# Patient Record
Sex: Female | Born: 1950 | Race: White | Hispanic: No | Smoking: Never smoker
Health system: Southern US, Community
[De-identification: ages and names within clinical notes are randomized; demographics above are authoritative.]

## PROBLEM LIST (undated history)

## (undated) DIAGNOSIS — E785 Hyperlipidemia, unspecified: Secondary | ICD-10-CM

## (undated) DIAGNOSIS — K219 Gastro-esophageal reflux disease without esophagitis: Secondary | ICD-10-CM

## (undated) DIAGNOSIS — M1990 Unspecified osteoarthritis, unspecified site: Secondary | ICD-10-CM

## (undated) DIAGNOSIS — E039 Hypothyroidism, unspecified: Secondary | ICD-10-CM

## (undated) DIAGNOSIS — T7840XA Allergy, unspecified, initial encounter: Secondary | ICD-10-CM

## (undated) DIAGNOSIS — I10 Essential (primary) hypertension: Secondary | ICD-10-CM

## (undated) DIAGNOSIS — R011 Cardiac murmur, unspecified: Secondary | ICD-10-CM

## (undated) DIAGNOSIS — K759 Inflammatory liver disease, unspecified: Secondary | ICD-10-CM

## (undated) HISTORY — DX: Gastro-esophageal reflux disease without esophagitis: K21.9

## (undated) HISTORY — PX: TONSILLECTOMY: SUR1361

## (undated) HISTORY — DX: Hypothyroidism, unspecified: E03.9

## (undated) HISTORY — DX: Essential (primary) hypertension: I10

## (undated) HISTORY — DX: Cardiac murmur, unspecified: R01.1

## (undated) HISTORY — PX: DILATION AND CURETTAGE OF UTERUS: SHX78

## (undated) HISTORY — PX: DILATION AND CURETTAGE, DIAGNOSTIC / THERAPEUTIC: SUR384

## (undated) HISTORY — DX: Allergy, unspecified, initial encounter: T78.40XA

## (undated) HISTORY — DX: Unspecified osteoarthritis, unspecified site: M19.90

---

## 2002-10-14 ENCOUNTER — Ambulatory Visit (HOSPITAL_COMMUNITY): Admission: RE | Admit: 2002-10-14 | Discharge: 2002-10-14 | Payer: Self-pay | Admitting: Unknown Physician Specialty

## 2002-10-14 ENCOUNTER — Encounter: Payer: Self-pay | Admitting: Unknown Physician Specialty

## 2002-11-05 ENCOUNTER — Ambulatory Visit (HOSPITAL_COMMUNITY): Admission: RE | Admit: 2002-11-05 | Discharge: 2002-11-05 | Payer: Self-pay | Admitting: Unknown Physician Specialty

## 2002-11-05 ENCOUNTER — Encounter: Payer: Self-pay | Admitting: Unknown Physician Specialty

## 2005-03-22 ENCOUNTER — Ambulatory Visit: Payer: Self-pay | Admitting: Family Medicine

## 2005-03-31 ENCOUNTER — Ambulatory Visit (HOSPITAL_COMMUNITY): Admission: RE | Admit: 2005-03-31 | Discharge: 2005-03-31 | Payer: Self-pay | Admitting: Family Medicine

## 2005-06-20 ENCOUNTER — Ambulatory Visit: Payer: Self-pay | Admitting: Family Medicine

## 2005-06-23 ENCOUNTER — Ambulatory Visit: Payer: Self-pay | Admitting: Family Medicine

## 2005-08-31 ENCOUNTER — Ambulatory Visit: Payer: Self-pay | Admitting: Family Medicine

## 2005-11-07 ENCOUNTER — Ambulatory Visit: Payer: Self-pay | Admitting: Family Medicine

## 2006-03-08 ENCOUNTER — Ambulatory Visit: Payer: Self-pay | Admitting: Family Medicine

## 2011-09-22 ENCOUNTER — Encounter (HOSPITAL_COMMUNITY): Payer: Self-pay | Admitting: Emergency Medicine

## 2011-09-22 ENCOUNTER — Emergency Department (HOSPITAL_COMMUNITY): Payer: BC Managed Care – PPO

## 2011-09-22 ENCOUNTER — Emergency Department (HOSPITAL_COMMUNITY)
Admission: EM | Admit: 2011-09-22 | Discharge: 2011-09-22 | Disposition: A | Discharge status: Home / Self Care | Payer: BC Managed Care – PPO | Attending: Emergency Medicine | Admitting: Emergency Medicine

## 2011-09-22 DIAGNOSIS — R109 Unspecified abdominal pain: Secondary | ICD-10-CM | POA: Insufficient documentation

## 2011-09-22 DIAGNOSIS — K449 Diaphragmatic hernia without obstruction or gangrene: Secondary | ICD-10-CM | POA: Insufficient documentation

## 2011-09-22 DIAGNOSIS — J029 Acute pharyngitis, unspecified: Secondary | ICD-10-CM | POA: Insufficient documentation

## 2011-09-22 DIAGNOSIS — E039 Hypothyroidism, unspecified: Secondary | ICD-10-CM | POA: Insufficient documentation

## 2011-09-22 DIAGNOSIS — N390 Urinary tract infection, site not specified: Secondary | ICD-10-CM | POA: Insufficient documentation

## 2011-09-22 DIAGNOSIS — M549 Dorsalgia, unspecified: Secondary | ICD-10-CM | POA: Insufficient documentation

## 2011-09-22 DIAGNOSIS — Z79899 Other long term (current) drug therapy: Secondary | ICD-10-CM | POA: Insufficient documentation

## 2011-09-22 HISTORY — DX: Hyperlipidemia, unspecified: E78.5

## 2011-09-22 LAB — URINE MICROSCOPIC-ADD ON

## 2011-09-22 LAB — URINALYSIS, ROUTINE W REFLEX MICROSCOPIC
Bilirubin Urine: NEGATIVE
Glucose, UA: NEGATIVE mg/dL
Hgb urine dipstick: NEGATIVE
Ketones, ur: NEGATIVE mg/dL
Nitrite: NEGATIVE
Protein, ur: NEGATIVE mg/dL
Specific Gravity, Urine: 1.02 (ref 1.005–1.030)
Urobilinogen, UA: 0.2 mg/dL (ref 0.0–1.0)
pH: 6 (ref 5.0–8.0)

## 2011-09-22 LAB — DIFFERENTIAL
Basophils Absolute: 0.1 10*3/uL (ref 0.0–0.1)
Basophils Relative: 1 % (ref 0–1)
Eosinophils Absolute: 0.2 10*3/uL (ref 0.0–0.7)
Eosinophils Relative: 2 % (ref 0–5)
Lymphocytes Relative: 24 % (ref 12–46)
Lymphs Abs: 1.8 10*3/uL (ref 0.7–4.0)
Monocytes Absolute: 0.7 10*3/uL (ref 0.1–1.0)
Monocytes Relative: 8 % (ref 3–12)
Neutro Abs: 5.1 10*3/uL (ref 1.7–7.7)
Neutrophils Relative %: 65 % (ref 43–77)

## 2011-09-22 LAB — CBC
HCT: 44 % (ref 36.0–46.0)
Hemoglobin: 14 g/dL (ref 12.0–15.0)
MCH: 30 pg (ref 26.0–34.0)
MCHC: 31.8 g/dL (ref 30.0–36.0)
MCV: 94.2 fL (ref 78.0–100.0)
Platelets: 272 10*3/uL (ref 150–400)
RBC: 4.67 MIL/uL (ref 3.87–5.11)
RDW: 14 % (ref 11.5–15.5)
WBC: 7.8 10*3/uL (ref 4.0–10.5)

## 2011-09-22 LAB — COMPREHENSIVE METABOLIC PANEL
ALT: 28 U/L (ref 0–35)
AST: 27 U/L (ref 0–37)
Albumin: 4.2 g/dL (ref 3.5–5.2)
Alkaline Phosphatase: 86 U/L (ref 39–117)
BUN: 17 mg/dL (ref 6–23)
CO2: 26 mEq/L (ref 19–32)
Calcium: 10.3 mg/dL (ref 8.4–10.5)
Chloride: 102 mEq/L (ref 96–112)
Creatinine, Ser: 0.87 mg/dL (ref 0.50–1.10)
GFR calc Af Amer: 81 mL/min — ABNORMAL LOW (ref >90)
GFR calc non Af Amer: 70 mL/min — ABNORMAL LOW (ref >90)
Glucose, Bld: 106 mg/dL — ABNORMAL HIGH (ref 70–99)
Potassium: 4.2 mEq/L (ref 3.5–5.1)
Sodium: 139 mEq/L (ref 135–145)
Total Bilirubin: 0.2 mg/dL — ABNORMAL LOW (ref 0.3–1.2)
Total Protein: 7.6 g/dL (ref 6.0–8.3)

## 2011-09-22 LAB — LIPASE, BLOOD: Lipase: 25 U/L (ref 11–59)

## 2011-09-22 LAB — TROPONIN I: Troponin I: <0.3 ng/mL (ref <0.30)

## 2011-09-22 MED ORDER — SODIUM CHLORIDE 0.9 % IV SOLN: INTRAVENOUS | Status: DC

## 2011-09-22 MED ORDER — IOHEXOL 300 MG/ML  SOLN
100.0000 mL | Freq: Once | INTRAMUSCULAR | Status: AC | PRN
  Administered 2011-09-22: 100 mL via INTRAVENOUS

## 2011-09-22 MED ORDER — ONDANSETRON HCL 4 MG/2ML IJ SOLN: 4.0000 mg | Freq: Once | INTRAMUSCULAR | Status: DC

## 2011-09-22 MED ORDER — DEXTROSE 5 % IV SOLN
1.0000 g | Freq: Once | INTRAVENOUS | Status: AC
  Administered 2011-09-22: 1 g via INTRAVENOUS
  Filled 2011-09-22: qty 10

## 2011-09-22 MED ORDER — HYDROMORPHONE HCL PF 1 MG/ML IJ SOLN: 1.0000 mg | Freq: Once | INTRAMUSCULAR | Status: DC

## 2011-09-22 MED ORDER — CEPHALEXIN 500 MG PO CAPS: 500.0000 mg | ORAL_CAPSULE | Freq: Four times a day (QID) | ORAL | Status: AC

## 2011-09-22 MED ORDER — HYDROCODONE-ACETAMINOPHEN 5-325 MG PO TABS: 1.0000 | ORAL_TABLET | Freq: Four times a day (QID) | ORAL | Status: DC | PRN

## 2011-09-22 MED ORDER — SODIUM CHLORIDE 0.9 % IV BOLUS (SEPSIS): 250.0000 mL | Freq: Once | INTRAVENOUS | Status: DC

## 2011-09-22 NOTE — ED Notes (Signed)
Pt c/o rt sided abd/flank pain since lunch today. Pt states the sharp pain is intermittent.

## 2011-09-22 NOTE — Discharge Instructions (Signed)
Urinary Tract Infection A urinary tract infection (UTI) is often caused by a germ (bacteria). A UTI is usually helped with medicine (antibiotics) that kills germs. Take all the medicine until it is gone. Do this even if you are feeling better. You are usually better in 7 to 10 days. HOME CARE   Drink enough water and fluids to keep your pee (urine) clear or pale yellow. Drink:   Cranberry juice.   Water.   Avoid:   Caffeine.   Tea.   Bubbly (carbonated) drinks.   Alcohol.   Only take medicine as told by your doctor.   To prevent further infections:   Pee often.   After pooping (bowel movement), women should wipe from front to back. Use each tissue only once.   Pee before and after having sex (intercourse).  Ask your doctor when your test results will be ready. Make sure you follow up and get your test results.  GET HELP RIGHT AWAY IF:   There is very bad back pain or lower belly (abdominal) pain.   You get the chills.   You have a fever.   Your baby is older than 3 months with a rectal temperature of 102 F (38.9 C) or higher.   Your baby is 63 months old or younger with a rectal temperature of 100.4 F (38 C) or higher.   You feel sick to your stomach (nauseous) or throw up (vomit).   There is continued burning with peeing.   Your problems are not better in 3 days. Return sooner if you are getting worse.  MAKE SURE YOU:   Understand these instructions.   Will watch your condition.   Will get help right away if you are not doing well or get worse.  Document Released: 11/15/2007 Document Revised: 05/18/2011 Document Reviewed: 11/15/2007 [facility] Patient Information 2012 [place], [place].  Take antibioticas directed and  followup with your doctor or back here if not better in 2 days or for any new or worse symptoms. Other possibility could be peptic ulcer disease which you're already taking Zantac on a regular basis. Pain medicine provided as needed.

## 2011-09-22 NOTE — ED Provider Notes (Signed)
History  This chart was scribed for Shelda Jakes, MD by Bennett Scrape and Temilola Ajibulu. This patient was seen in room AP[facility]2/AP[facility] and the patient's care was started at 7:25PM.  CSN: [id number]  Arrival date & time 09/22/11  1636   First MD Initiated Contact with Patient 09/22/11 1916      Chief Complaint  Patient presents with  . Abdominal Pain  . Flank Pain    The history is provided by the patient. No language interpreter was used.    Kelly Barajas is a 61 y.o. female who presents to the Emergency Department complaining of 5 hours of gradual onset, gradually improving, intermittent RUQ abdominal pain with radiation to the flank pain and umbilicus that started after she ate lunch.  Pt describes the pain as sharp. She reports that the pain has currently "eased off" and is now a dull ache. Pt rates her pain a 10/10 at onset but has eased of to a 3/10 currently. Pt denies any modifying factors. She denies taking any medication to ease the symptoms. She reports calling her PCP and was advised to be evaluated in this ED for the symptoms. She denies having any previous episodes of similar symptoms. She also c/o a sore throat and undescribed "sinus symptoms" that she attributes to allergies. Pt also denies fatigue, SOB, chest pain and rash as associated symptoms. Pt has a h/o hyperlipidemia and decreased thyroid activity. Pt denies smoking or alcohol use.   Pt's PCP is Dr. Lysbeth Galas.  Past Medical History  Diagnosis Date  . Hyperlipidemia   . Thyroid activity decreased     History reviewed. No pertinent past surgical history.  No family history on file.  History  Substance Use Topics  . Smoking status: Not on file  . Smokeless tobacco: Not on file  . Alcohol Use: No     Review of Systems  Constitutional: Negative for fever and chills.  HENT: Positive for sore throat. Negative for congestion and neck pain.   Eyes: Negative for visual disturbance.  Respiratory:  Negative for cough and shortness of breath.   Cardiovascular: Negative for chest pain.  Gastrointestinal: Positive for abdominal pain. Negative for nausea, vomiting and diarrhea.  Genitourinary: Negative for dysuria, urgency and hematuria.  Musculoskeletal: Positive for back pain.  Skin: Negative for rash.  Neurological: Negative for seizures and headaches.  Hematological: Does not bruise/bleed easily.  Psychiatric/Behavioral: Negative for confusion.    Allergies  Review of patient's allergies indicates no known allergies.  Home Medications   Current Outpatient Rx  Name Route Sig Dispense Refill  . ASPIRIN EC 81 MG PO TBEC Oral Take 81 mg by mouth at bedtime.    . OMEGA-3 FATTY ACIDS 1000 MG PO CAPS Oral Take 1 g by mouth 2 (two) times daily.    Marland Kitchen LEVOTHYROXINE SODIUM 75 MCG PO TABS Oral Take 75 mcg by mouth every morning.    Marland Kitchen NAPROXEN SODIUM 220 MG PO TABS Oral Take 220-440 mg by mouth as needed. For pain    . PITAVASTATIN CALCIUM 1 MG PO TABS Oral Take 1 tablet by mouth at bedtime.    Marland Kitchen RANITIDINE HCL 150 MG PO TABS Oral Take 150 mg by mouth at bedtime.    . CEPHALEXIN 500 MG PO CAPS Oral Take 1 capsule (500 mg total) by mouth 4 (four) times daily. 28 capsule 0  . HYDROCODONE-ACETAMINOPHEN 5-325 MG PO TABS Oral Take 1-2 tablets by mouth every 6 (six) hours as needed for pain. 10  tablet 0    Triage Vitals: BP 154/88  Pulse 97  Temp(Src) 97.6 F (36.4 C) (Oral)  Resp 18  Ht 5\' 5"  (1.651 m)  Wt 185 lb (83.915 kg)  BMI 30.79 kg/m2  SpO2 95%  Physical Exam  Nursing note and vitals reviewed. Constitutional: She is oriented to person, place, and time. She appears well-developed and well-nourished.  HENT:  Head: Normocephalic and atraumatic.  Eyes: Conjunctivae and EOM are normal. No scleral icterus.  Neck: Normal range of motion. Neck supple.  Cardiovascular: Normal rate, regular rhythm and normal heart sounds.   Pulmonary/Chest: Effort normal and breath sounds normal.    Abdominal: Soft. Bowel sounds are normal. There is no tenderness. There is no guarding.  Musculoskeletal: Normal range of motion. She exhibits no edema.  Neurological: She is alert and oriented to person, place, and time.  Skin: Skin is warm and dry.  Psychiatric: She has a normal mood and affect. Her behavior is normal.    ED Course  Procedures (including critical care time)  DIAGNOSTIC STUDIES: Oxygen Saturation is 95% on room air, adequate by my interpretation.    COORDINATION OF CARE: 7:35PM- Discussed CT scan of abdomen with pt and pt agreed to scan. Pt stated that since pain was dull, she did not want any pain medications currently.   Labs Reviewed  URINALYSIS, ROUTINE W REFLEX MICROSCOPIC - Abnormal; Notable for the following:    Color, Urine AMBER (*) BIOCHEMICALS MAY BE AFFECTED BY COLOR   Leukocytes, UA MODERATE (*)    All other components within normal limits  COMPREHENSIVE METABOLIC PANEL - Abnormal; Notable for the following:    Glucose, Bld 106 (*)    Total Bilirubin 0.2 (*)    GFR calc non Af Amer 70 (*)    GFR calc Af Amer 81 (*)    All other components within normal limits  URINE MICROSCOPIC-ADD ON - Abnormal; Notable for the following:    Squamous Epithelial / LPF FEW (*)    Bacteria, UA FEW (*)    All other components within normal limits  CBC  DIFFERENTIAL  LIPASE, BLOOD  TROPONIN I  URINE CULTURE   Dg Chest 2 View  09/22/2011  *RADIOLOGY REPORT*  Clinical Data: Abdominal and flank pain.  CHEST - 2 VIEW  Comparison: None  Findings: Heart size is normal. Both lungs are clear.  No evidence of pleural effusion.  No mass or lymphadenopathy identified.  IMPRESSION: No active disease.  Original Report Authenticated By: Danae Orleans, M.D.   Ct Abdomen Pelvis W Contrast  09/22/2011  *RADIOLOGY REPORT*  Clinical Data: RLQ/right flank pain  CT ABDOMEN AND PELVIS WITH CONTRAST  Technique:  Multidetector CT imaging of the abdomen and pelvis was performed following  the standard protocol during bolus administration of intravenous contrast.  Contrast: OMNIPAQUE IOHEXOL 300 MG/ML  SOLN  Comparison: None.  Findings: Mild scarring versus atelectasis at the lung bases.  Moderate hiatal hernia.  Liver, spleen, pancreas, and adrenal glands are within normal limits.  Gallbladder is unremarkable.  No intrahepatic or extrahepatic ductal dilatation.  Kidneys are within normal limits.  No hydronephrosis.  No evidence of bowel obstruction.  Normal appendix.  Apparent soft tissue density in the region of the cecum/ileocecal valve is favored to reflect stool/debris with mixing of contrast (series 2/image 40).  Atherosclerotic calcifications of the abdominal aorta and branch vessels.  No abdominopelvic ascites.  No suspicious abdominopelvic lymphadenopathy.  Uterus and bilateral ovaries are unremarkable.  Bladder is  within normal limits.  Mild degenerative changes of the visualized thoracolumbar spine, most prominent at L5-S1.  IMPRESSION: Normal appendix.  No evidence of bowel obstruction.  Moderate hiatal hernia.  No CT findings to account for the patient's abdominal pain.  Original Report Authenticated By: Charline Bills, M.D.     Date: 09/22/2011  Rate: 64  Rhythm: normal sinus rhythm  QRS Axis: normal  Intervals: normal  ST/T Wave abnormalities: normal  Conduction Disutrbances:none  Narrative Interpretation:   Old EKG Reviewed: none available  Results for orders placed during the hospital encounter of 09/22/11  URINALYSIS, ROUTINE W REFLEX MICROSCOPIC      Component Value Range   Color, Urine AMBER (*) YELLOW    APPearance CLEAR  CLEAR    Specific Gravity, Urine 1.020  1.005 - 1.030    pH 6.0  5.0 - 8.0    Glucose, UA NEGATIVE  NEGATIVE (mg/dL)   Hgb urine dipstick NEGATIVE  NEGATIVE    Bilirubin Urine NEGATIVE  NEGATIVE    Ketones, ur NEGATIVE  NEGATIVE (mg/dL)   Protein, ur NEGATIVE  NEGATIVE (mg/dL)   Urobilinogen, UA 0.2  0.0 - 1.0 (mg/dL)   Nitrite  NEGATIVE  NEGATIVE    Leukocytes, UA MODERATE (*) NEGATIVE   COMPREHENSIVE METABOLIC PANEL      Component Value Range   Sodium 139  135 - 145 (mEq/L)   Potassium 4.2  3.5 - 5.1 (mEq/L)   Chloride 102  96 - 112 (mEq/L)   CO2 26  19 - 32 (mEq/L)   Glucose, Bld 106 (*) 70 - 99 (mg/dL)   BUN 17  6 - 23 (mg/dL)   Creatinine, Ser 8.46  0.50 - 1.10 (mg/dL)   Calcium 96.2  8.4 - 10.5 (mg/dL)   Total Protein 7.6  6.0 - 8.3 (g/dL)   Albumin 4.2  3.5 - 5.2 (g/dL)   AST 27  0 - 37 (U/L)   ALT 28  0 - 35 (U/L)   Alkaline Phosphatase 86  39 - 117 (U/L)   Total Bilirubin 0.2 (*) 0.3 - 1.2 (mg/dL)   GFR calc non Af Amer 70 (*) >90 (mL/min)   GFR calc Af Amer 81 (*) >90 (mL/min)  CBC      Component Value Range   WBC 7.8  4.0 - 10.5 (K/uL)   RBC 4.67  3.87 - 5.11 (MIL/uL)   Hemoglobin 14.0  12.0 - 15.0 (g/dL)   HCT 95.2  84.1 - 32.4 (%)   MCV 94.2  78.0 - 100.0 (fL)   MCH 30.0  26.0 - 34.0 (pg)   MCHC 31.8  30.0 - 36.0 (g/dL)   RDW 40.1  02.7 - 25.3 (%)   Platelets 272  150 - 400 (K/uL)  DIFFERENTIAL      Component Value Range   Neutrophils Relative 65  43 - 77 (%)   Neutro Abs 5.1  1.7 - 7.7 (K/uL)   Lymphocytes Relative 24  12 - 46 (%)   Lymphs Abs 1.8  0.7 - 4.0 (K/uL)   Monocytes Relative 8  3 - 12 (%)   Monocytes Absolute 0.7  0.1 - 1.0 (K/uL)   Eosinophils Relative 2  0 - 5 (%)   Eosinophils Absolute 0.2  0.0 - 0.7 (K/uL)   Basophils Relative 1  0 - 1 (%)   Basophils Absolute 0.1  0.0 - 0.1 (K/uL)  URINE MICROSCOPIC-ADD ON      Component Value Range   Squamous Epithelial / LPF FEW (*)  RARE    WBC, UA 21-50  <3 (WBC/hpf)   RBC / HPF 0-2  <3 (RBC/hpf)   Bacteria, UA FEW (*) RARE   LIPASE, BLOOD      Component Value Range   Lipase 25  11 - 59 (U/L)  TROPONIN I      Component Value Range   Troponin I <0.30  <0.30 (ng/mL)     1. Urinary tract infection       MDM   Workup for right upper quadrant pain negative CT no evidence of gallstones appendix is normal urinalysis  consistent with urinary tract infection without question. We'll treat with Rocephin 1 g here and Keflex by mouth at home. No evidence of cardiac event no acute changes on EKG troponin is negative. Symptoms most likely all related to urinary tract infection of peptic ulcer disease is a possibility. Patient already taking Zantac.      I personally performed the services described in this documentation, which was scribed in my presence. The recorded information has been reviewed and considered.     Shelda Jakes, MD 09/22/11 [id number]

## 2011-09-24 LAB — URINE CULTURE
Colony Count: NO GROWTH
Culture  Setup Time: 201304132045
Culture: NO GROWTH

## 2011-09-28 ENCOUNTER — Ambulatory Visit (INDEPENDENT_AMBULATORY_CARE_PROVIDER_SITE_OTHER): Payer: BC Managed Care – PPO | Admitting: Internal Medicine

## 2011-09-28 ENCOUNTER — Encounter (INDEPENDENT_AMBULATORY_CARE_PROVIDER_SITE_OTHER): Payer: Self-pay | Admitting: Internal Medicine

## 2011-09-28 VITALS — BP 136/82 | HR 80 | Temp 97.9°F | Ht 65.0 in | Wt 184.6 lb

## 2011-09-28 DIAGNOSIS — N390 Urinary tract infection, site not specified: Secondary | ICD-10-CM | POA: Insufficient documentation

## 2011-09-28 DIAGNOSIS — N200 Calculus of kidney: Secondary | ICD-10-CM | POA: Insufficient documentation

## 2011-09-28 DIAGNOSIS — R1031 Right lower quadrant pain: Secondary | ICD-10-CM | POA: Insufficient documentation

## 2011-09-28 NOTE — Patient Instructions (Signed)
If pain returns, call our office or go to the ED. Suspect she had a kidney stone.

## 2011-09-28 NOTE — Progress Notes (Signed)
Subjective:     Patient ID: Kelly Barajas, female   DOB: 03/28/1951, 61 y.o.   MRN: [id number]  HPIIn the ED 09/22/2011 with mid rt abdominal pain and radiating to her umbilicus. Felt a cramping type pain.She said the pain was like having a baby. She actually thought she had a virus.  The pain became severe and she was bending over with the pain. She went to the ED. She tells me by 9pm the pain had resolved. The pain resolved before going to CT. She had gone to the bathroom to void several times before the CT. The pain lasted 6 hrs. She tells me she has been on a diet and has lost about 20 pounds. Her acid reflux is better. She had a lot of gas that night in the ED. Today she has lower back pain.  She has had this episode about 2-3 times in the past.  At times foods are lodging in her esophagus. She tries to relax for the food to go down. Symptoms for along time. Acid reflux is better since losing weight.  BM are normal. No weight loss. Appetite is good. CT abdomen/pelvis with CM 09/22/2011; IMPRESSION:  Normal appendix. No evidence of bowel obstruction.  Moderate hiatal hernia.  No CT findings to account for the patient's abdominal pain. CMP     Component Value Date/Time   NA 139 09/22/2011 1730   K 4.2 09/22/2011 1730   CL 102 09/22/2011 1730   CO2 26 09/22/2011 1730   GLUCOSE 106* 09/22/2011 1730   BUN 17 09/22/2011 1730   CREATININE 0.87 09/22/2011 1730   CALCIUM 10.3 09/22/2011 1730   PROT 7.6 09/22/2011 1730   ALBUMIN 4.2 09/22/2011 1730   AST 27 09/22/2011 1730   ALT 28 09/22/2011 1730   ALKPHOS 86 09/22/2011 1730   BILITOT 0.2* 09/22/2011 1730   GFRNONAA 70* 09/22/2011 1730   GFRAA 81* 09/22/2011 1730    Urinalysis    Component Value Date/Time   COLORURINE AMBER* 09/22/2011 1824   APPEARANCEUR CLEAR 09/22/2011 1824   LABSPEC 1.020 09/22/2011 1824   PHURINE 6.0 09/22/2011 1824   GLUCOSEU NEGATIVE 09/22/2011 1824   HGBUR NEGATIVE 09/22/2011 1824   BILIRUBINUR NEGATIVE 09/22/2011 1824   KETONESUR NEGATIVE 09/22/2011 1824   PROTEINUR NEGATIVE 09/22/2011 1824   UROBILINOGEN 0.2 09/22/2011 1824   NITRITE NEGATIVE 09/22/2011 1824   LEUKOCYTESUR MODERATE* 09/22/2011 1824   CBC    Component Value Date/Time   WBC 7.8 09/22/2011 1730   RBC 4.67 09/22/2011 1730   HGB 14.0 09/22/2011 1730   HCT 44.0 09/22/2011 1730   PLT 272 09/22/2011 1730   MCV 94.2 09/22/2011 1730   MCH 30.0 09/22/2011 1730   MCHC 31.8 09/22/2011 1730   RDW 14.0 09/22/2011 1730   LYMPHSABS 1.8 09/22/2011 1730   MONOABS 0.7 09/22/2011 1730   EOSABS 0.2 09/22/2011 1730   BASOSABS 0.1 09/22/2011 1730       Review of Systems see hpi    Objective:   Physical Exam Filed Vitals:   09/28/11 1437  Height: 5\' 5"  (1.651 m)  Weight: 184 lb 9.6 oz (83.734 kg)   Alert and oriented. Skin warm and dry. Oral mucosa is moist.   . Sclera anicteric, conjunctivae is pink. Thyroid not enlarged. No cervical lymphadenopathy. Lungs clear. Heart regular rate and rhythm.  Abdomen is soft. Bowel sounds are positive. No hepatomegaly. No abdominal masses felt. No tenderness.  No edema to lower extremities. Patient is alert and oriented.  Assessment:   Suspect she may have passed a kidney stone before arriving in CT.  Her symptoms do not suggest PUD.  Her symptoms resolved suddenly and pain was located near the umblicus.  CBD was normal caliber. There was no bump in her transaminases.   Occasionally dysphagia. Plan:       Continue the Zantac daily. If symptoms return call our office or go to the emergency room.

## 2012-10-04 ENCOUNTER — Ambulatory Visit (INDEPENDENT_AMBULATORY_CARE_PROVIDER_SITE_OTHER): Payer: PRIVATE HEALTH INSURANCE | Admitting: Cardiology

## 2012-10-04 ENCOUNTER — Telehealth: Payer: Self-pay | Admitting: Cardiology

## 2012-10-04 ENCOUNTER — Encounter: Payer: Self-pay | Admitting: *Deleted

## 2012-10-04 ENCOUNTER — Encounter: Payer: Self-pay | Admitting: Cardiology

## 2012-10-04 VITALS — BP 132/83 | HR 97 | Ht 63.0 in | Wt 204.0 lb

## 2012-10-04 DIAGNOSIS — M79602 Pain in left arm: Secondary | ICD-10-CM | POA: Insufficient documentation

## 2012-10-04 DIAGNOSIS — R9431 Abnormal electrocardiogram [ECG] [EKG]: Secondary | ICD-10-CM

## 2012-10-04 DIAGNOSIS — R0602 Shortness of breath: Secondary | ICD-10-CM

## 2012-10-04 DIAGNOSIS — M79609 Pain in unspecified limb: Secondary | ICD-10-CM

## 2012-10-04 DIAGNOSIS — E782 Mixed hyperlipidemia: Secondary | ICD-10-CM | POA: Insufficient documentation

## 2012-10-04 NOTE — Assessment & Plan Note (Signed)
On regimen outlined above. She willl be following up with her primary care provider over the next 8 weeks.

## 2012-10-04 NOTE — Telephone Encounter (Signed)
stress echocardiogram    Wednesday, April 30th , 2014 [facility] CHECKING PERCERT

## 2012-10-04 NOTE — Assessment & Plan Note (Signed)
As described above, no associated chest pain. Her recent arm discomfort was nonexertional, possibly related to statin based on improvement with change in medication. Her ECG does show decreased R waves in the anteroseptal leads, low voltage. Personal risk factors include hyperlipidemia that has been long-standing, also family history of CAD in both parents. Plan is to further risk stratify with an exercise echocardiogram. We will call her with the results.

## 2012-10-04 NOTE — Patient Instructions (Addendum)
Your physician recommends that you continue on your current medications as directed. Please refer to the Current Medication list given to you today. Your physician has requested that you have a stress echocardiogram. For further information please visit [email or web address]. Please follow instruction sheet as given. We will call you with your results.  

## 2012-10-04 NOTE — Progress Notes (Signed)
Clinical Summary [name] is a 62 y.o.female referred for cardiology consultation by Kelly Olp NP. She is referred for further cardiac risk stratification in light of personal risk factors and family history. She also reports a fairly chronic feeling of dyspnea on exertion, generally NYHA class II, no exertional chest pain.  She has had some arm discomfort, reported that it was worse when she was on Pravachol, and improving now on the regimen outlined below. This was nonexertional in nature. She denies any frank claudication symptoms.  She does not exercise with any regularity. States that she has gained 16 pounds over several months. She does seem to be motivated to try and reverse this trend.  ECG today shows sinus rhythm with decreased anteroseptal R waves, single PVC, low voltage. She has not undergone any previous ischemic testing  No Known Allergies  Current Outpatient Prescriptions  Medication Sig Dispense Refill  . aspirin EC 81 MG tablet Take 81 mg by mouth at bedtime.      . fish oil-omega-3 fatty acids 1000 MG capsule Take 1 g by mouth 4 (four) times daily.       Marland Kitchen levothyroxine (SYNTHROID, LEVOTHROID) 75 MCG tablet Take 75 mcg by mouth every morning.      . naproxen sodium (ANAPROX) 220 MG tablet Take 440 mg by mouth 2 (two) times daily. For pain      . Pitavastatin Calcium (LIVALO) 1 MG TABS Take 2 tablets by mouth 4 (four) times a week.       . ranitidine (ZANTAC) 150 MG tablet Take 150 mg by mouth 2 (two) times daily.        No current facility-administered medications for this visit.    Past Medical History  Diagnosis Date  . Hyperlipidemia   . Hypothyroidism   . GERD (gastroesophageal reflux disease)     Past Surgical History  Procedure Laterality Date  . Tonsillectomy      Family History  Problem Relation Age of Onset  . CAD Father   . Hyperlipidemia Mother   . Diabetes Mellitus II Mother   . CAD Mother   . Hyperlipidemia Father     Social  History [name] reports that she has never smoked. She does not have any smokeless tobacco history on file. [name] reports that she does not drink alcohol.  Review of Systems No palpitations, dizziness, syncope. Stable appetite. No orthopnea or PND. No bleeding problems. Otherwise negative.  Physical Examination Filed Vitals:   10/04/12 1017  BP: 132/83  Pulse: 97   Filed Weights   10/04/12 1017  Weight: 204 lb (92.534 kg)   Overweight woman in no acute distress. HEENT: Conjunctiva and lids normal, oropharynx clear. Neck: Supple, no elevated JVP or carotid bruits, no thyromegaly. Lungs: Clear to auscultation, nonlabored breathing at rest. Cardiac: Regular rate and rhythm, no S3, soft systolic murmur, no pericardial rub. Abdomen: Soft, nontender, bowel sounds present. Extremities: No pitting edema, distal pulses 2+. Skin: Warm and dry. Musculoskeletal: No kyphosis. Neuropsychiatric: Alert and oriented x3, affect grossly appropriate.   Problem List and Plan   Shortness of breath As described above, no associated chest pain. Her recent arm discomfort was nonexertional, possibly related to statin based on improvement with change in medication. Her ECG does show decreased R waves in the anteroseptal leads, low voltage. Personal risk factors include hyperlipidemia that has been long-standing, also family history of CAD in both parents. Plan is to further risk stratify with an exercise echocardiogram. We will  call her with the results.  Mixed hyperlipidemia On regimen outlined above. She willl be following up with her primary care provider over the next 8 weeks.    Jonelle Sidle, M.D., F.A.C.C.

## 2012-10-04 NOTE — Telephone Encounter (Signed)
No precert required 

## 2012-10-09 DIAGNOSIS — R0602 Shortness of breath: Secondary | ICD-10-CM

## 2012-10-14 ENCOUNTER — Telehealth: Payer: Self-pay | Admitting: *Deleted

## 2012-10-14 NOTE — Telephone Encounter (Signed)
Message copied by Eustace Moore on Mon Oct 14, 2012  2:34 PM ------      Message from: Jonelle Sidle      Created: Fri Oct 11, 2012  9:51 AM       Reviewed. No definite ischemia based on echocardiographic images with probable false positive ECG response. No further cardiac ischemic testing planned at this time, unless she manifests any progressive symptoms. Keep follow with primary care, and we can see her back if needed. ------

## 2012-10-14 NOTE — Telephone Encounter (Signed)
Patient informed and request a copy be sent to her PCP. Copy sent.

## 2013-11-27 DIAGNOSIS — I10 Essential (primary) hypertension: Secondary | ICD-10-CM | POA: Insufficient documentation

## 2013-11-27 DIAGNOSIS — K219 Gastro-esophageal reflux disease without esophagitis: Secondary | ICD-10-CM | POA: Insufficient documentation

## 2013-11-27 HISTORY — DX: Essential (primary) hypertension: I10

## 2014-06-01 DIAGNOSIS — E039 Hypothyroidism, unspecified: Secondary | ICD-10-CM | POA: Insufficient documentation

## 2016-03-07 DIAGNOSIS — E039 Hypothyroidism, unspecified: Secondary | ICD-10-CM | POA: Diagnosis not present

## 2016-03-07 DIAGNOSIS — E559 Vitamin D deficiency, unspecified: Secondary | ICD-10-CM | POA: Diagnosis not present

## 2016-03-07 DIAGNOSIS — K219 Gastro-esophageal reflux disease without esophagitis: Secondary | ICD-10-CM | POA: Diagnosis not present

## 2016-03-07 DIAGNOSIS — N3281 Overactive bladder: Secondary | ICD-10-CM | POA: Diagnosis not present

## 2016-03-07 DIAGNOSIS — E785 Hyperlipidemia, unspecified: Secondary | ICD-10-CM | POA: Diagnosis not present

## 2016-04-04 ENCOUNTER — Encounter (HOSPITAL_COMMUNITY): Payer: Self-pay | Admitting: Emergency Medicine

## 2016-04-04 ENCOUNTER — Emergency Department (HOSPITAL_COMMUNITY): Payer: PPO

## 2016-04-04 ENCOUNTER — Emergency Department (HOSPITAL_COMMUNITY)
Admission: EM | Admit: 2016-04-04 | Discharge: 2016-04-04 | Disposition: A | Discharge status: Home / Self Care | Payer: PPO | Attending: Emergency Medicine | Admitting: Emergency Medicine

## 2016-04-04 DIAGNOSIS — X501XXA Overexertion from prolonged static or awkward postures, initial encounter: Secondary | ICD-10-CM | POA: Insufficient documentation

## 2016-04-04 DIAGNOSIS — M25562 Pain in left knee: Secondary | ICD-10-CM

## 2016-04-04 DIAGNOSIS — Y9301 Activity, walking, marching and hiking: Secondary | ICD-10-CM | POA: Insufficient documentation

## 2016-04-04 DIAGNOSIS — E039 Hypothyroidism, unspecified: Secondary | ICD-10-CM | POA: Insufficient documentation

## 2016-04-04 DIAGNOSIS — Z7982 Long term (current) use of aspirin: Secondary | ICD-10-CM | POA: Insufficient documentation

## 2016-04-04 DIAGNOSIS — Y929 Unspecified place or not applicable: Secondary | ICD-10-CM | POA: Insufficient documentation

## 2016-04-04 DIAGNOSIS — Y999 Unspecified external cause status: Secondary | ICD-10-CM | POA: Diagnosis not present

## 2016-04-04 MED ORDER — HYDROCODONE-ACETAMINOPHEN 5-325 MG PO TABS
1.0000 | ORAL_TABLET | Freq: Once | ORAL | Status: AC
  Administered 2016-04-04: 1 via ORAL
  Filled 2016-04-04: qty 1

## 2016-04-04 MED ORDER — IBUPROFEN 600 MG PO TABS: 600.0000 mg | ORAL_TABLET | Freq: Four times a day (QID) | ORAL | 0 refills | Status: DC | PRN

## 2016-04-04 NOTE — Progress Notes (Signed)
Orthopedic Tech Progress Note Patient Details:  Kelly HalimCarolyn M Doleman 10/05/1950 [id number] Crutches were ordered but patient was unable to ambulate properly with the device. Did not give crutches to patient, patient stated she would have a walker waiting for her at home. Gave her basic instructions for ambulation with walker. Ortho Devices Type of Ortho Device: Knee Immobilizer Ortho Device/Splint Location: LLE KI, Crutches  Ortho Device/Splint Interventions: Ordered, Application   Clois Dupesvery S Belmira Daley 04/04/2016, 10:47 PM

## 2016-04-04 NOTE — ED Triage Notes (Signed)
Pt. felt a pop at left knee while getting up a bleacher this evening , reports posterior left knee pain .

## 2016-04-04 NOTE — ED Notes (Signed)
Pt stable, understands discharge instructions, and reasons for return.   

## 2016-04-04 NOTE — ED Provider Notes (Signed)
[facility]-EMERGENCY DEPT Provider Note   CSN: [id number] Arrival date & time: 04/04/16  2032   By signing my name below, I, Teofilo PodMatthew P. Jamison, attest that this documentation has been prepared under the direction and in the presence of Felicie Mornavid Jordie Schreur, NP. Electronically Signed: Teofilo PodMatthew P. Jamison, ED Scribe. 04/04/2016. 9:36 PM.   History   Chief Complaint Chief Complaint  Patient presents with  . Knee Pain    The history is provided by the patient. No language interpreter was used.  Knee Pain   This is a new problem. The current episode started less than 1 hour ago. The problem occurs constantly. The problem has not changed since onset.The pain is present in the left knee. The pain is moderate. Associated symptoms comments: Positive for instability. She has tried nothing for the symptoms.   HPI Comments:  Kelly Barajas is a 65 y.o. female who presents to the Emergency Department s/p a left knee injury earlier this evening. Pt states that she was walking up some bleachers and heard a loud "pop" in her left knee. Pt reoprts associated posterior left knee pain. Pt notes that her knee feels unstable when bearing weight. Pt arrived to the ED in a wheelchair. No alleviating factors noted. Pt denies other associated symptoms.   Past Medical History:  Diagnosis Date  . GERD (gastroesophageal reflux disease)   . Hyperlipidemia   . Hypothyroidism     Patient Active Problem List   Diagnosis Date Noted  . Mixed hyperlipidemia 10/04/2012  . Shortness of breath 10/04/2012    Past Surgical History:  Procedure Laterality Date  . TONSILLECTOMY      OB History    No data available       Home Medications    Prior to Admission medications   Medication Sig Start Date End Date Taking? Authorizing Provider  aspirin EC 81 MG tablet Take 81 mg by mouth at bedtime.    Historical Provider, MD  fish oil-omega-3 fatty acids 1000 MG capsule Take 1 g by mouth 4 (four) times daily.      Historical Provider, MD  levothyroxine (SYNTHROID, LEVOTHROID) 75 MCG tablet Take 75 mcg by mouth every morning.    Historical Provider, MD  naproxen sodium (ANAPROX) 220 MG tablet Take 440 mg by mouth 2 (two) times daily. For pain    Historical Provider, MD  Pitavastatin Calcium (LIVALO) 1 MG TABS Take 2 tablets by mouth 4 (four) times a week.     Historical Provider, MD  ranitidine (ZANTAC) 150 MG tablet Take 150 mg by mouth 2 (two) times daily.     Historical Provider, MD    Family History Family History  Problem Relation Age of Onset  . Hyperlipidemia Mother   . Diabetes Mellitus II Mother   . CAD Mother   . CAD Father   . Hyperlipidemia Father     Social History Social History  Substance Use Topics  . Smoking status: Never Smoker  . Smokeless tobacco: Never Used  . Alcohol use No     Allergies   Review of patient's allergies indicates no known allergies.   Review of Systems Review of Systems  Constitutional: Negative for fever.  Musculoskeletal: Positive for arthralgias.  All other systems reviewed and are negative.    Physical Exam Updated Vital Signs BP 159/85 (BP Location: Right Arm)   Pulse 78   Temp 97.8 F (36.6 C) (Oral)   Resp 16   Ht 5\' 5"  (1.651 m)   Wt  210 lb (95.3 kg)   SpO2 97%   BMI 34.95 kg/m   Physical Exam  Constitutional: She appears well-developed and well-nourished. No distress.  HENT:  Head: Normocephalic and atraumatic.  Eyes: Conjunctivae are normal.  Cardiovascular: Normal rate.   Pulmonary/Chest: Effort normal.  Abdominal: She exhibits no distension.  Musculoskeletal:  Pain to the posterior, lateral aspect of the left knee and lower left thigh.   Neurological: She is alert.  Skin: Skin is warm and dry.  Psychiatric: She has a normal mood and affect.  Nursing note and vitals reviewed.    ED Treatments / Results  DIAGNOSTIC STUDIES:  Oxygen Saturation is 97% on RA, normal by my interpretation.    COORDINATION OF  CARE:  9:36 PM Discussed treatment plan with pt at bedside and pt agreed to plan.   Labs (all labs ordered are listed, but only abnormal results are displayed) Labs Reviewed - No data to display  EKG  EKG Interpretation None       Radiology Dg Knee Complete 4 Views Left  Result Date: 04/04/2016 CLINICAL DATA:  Left knee gave out. Patient heard a pop while walking on bleachers. Unable to bear weight. O EXAM: LEFT KNEE - COMPLETE 4+ VIEW COMPARISON:  None. FINDINGS: Medial femorotibial joint space narrowing is seen with spurring off the medial femoral condyle. There is spurring of the tibial eminence. No acute displaced fracture or joint effusion. No intra-articular loose body. Small phlebolith anterior to the tibial tuberosity. IMPRESSION: No acute osseous abnormality. Medial femorotibial osteoarthritis with spurring. Electronically Signed   By: Tollie Eth M.D.   On: 04/04/2016 21:41    Procedures Procedures (including critical care time)  Medications Ordered in ED Medications - No data to display   Initial Impression / Assessment and Plan / ED Course  Patient X-Ray negative for obvious fracture or dislocation.  Pt advised to follow up with orthopedics. Patient given knee immobilizer and crutches while in ED, conservative therapy recommended and discussed. Patient will be discharged home & is agreeable with above plan. Returns precautions discussed. Pt appears safe for discharge.   I have reviewed the triage vital signs and the nursing notes.  Pertinent labs & imaging results that were available during my care of the patient were reviewed by me and considered in my medical decision making (see chart for details).  Clinical Course  Patient with pain in the posterior/lateral aspect of the left knee.  No acute findings on xray. Suspect popliteus injury.     Final Clinical Impressions(s) / ED Diagnoses   Final diagnoses:  Acute pain of left knee    New Prescriptions New  Prescriptions   IBUPROFEN (ADVIL,MOTRIN) 600 MG TABLET    Take 1 tablet (600 mg total) by mouth every 6 (six) hours as needed.  I personally performed the services described in this documentation, which was scribed in my presence. The recorded information has been reviewed and is accurate.    Felicie Morn, NP 04/04/16 1610    Melene Plan, DO 04/04/16 9604

## 2016-04-06 ENCOUNTER — Ambulatory Visit (INDEPENDENT_AMBULATORY_CARE_PROVIDER_SITE_OTHER): Payer: PPO | Admitting: Orthopaedic Surgery

## 2016-04-06 ENCOUNTER — Encounter (INDEPENDENT_AMBULATORY_CARE_PROVIDER_SITE_OTHER): Payer: Self-pay | Admitting: Orthopaedic Surgery

## 2016-04-06 VITALS — BP 133/79 | HR 76 | Ht 65.0 in | Wt 210.0 lb

## 2016-04-06 DIAGNOSIS — S86912A Strain of unspecified muscle(s) and tendon(s) at lower leg level, left leg, initial encounter: Secondary | ICD-10-CM | POA: Insufficient documentation

## 2016-04-06 DIAGNOSIS — S86911A Strain of unspecified muscle(s) and tendon(s) at lower leg level, right leg, initial encounter: Secondary | ICD-10-CM | POA: Insufficient documentation

## 2016-04-06 NOTE — Progress Notes (Signed)
Office Visit Note   Patient: Kelly Barajas           Date of Birth: 1950-11-22           MRN: [id number] Visit Date: 04/06/2016              Requested by: Joette Catching, MD [address] [place], [place] [address] PCP: Josue Hector, MD   Assessment & Plan: Visit Diagnoses:  1. Knee strain, left, initial encounter     Plan: Continue knee immobilizer she can use ice elevation and anti-inflammatories, to work on gentle knee flexion in the sitting position continue with straight leg raising.  Follow-Up Instructions: Return in about 2 weeks (around 04/20/2016).   Orders:  No orders of the defined types were placed in this encounter.  No orders of the defined types were placed in this encounter.     Procedures: No procedures performed   Clinical Data: No additional findings.   Subjective: Chief Complaint  Patient presents with  . Left Knee - Pain    Patient complains of left knee pain after her knee popped and gave out on her while walking up the bleachers on 04/04/2016. She has used ice, immobilizer, Vicodin, Ibuprofen 600mg  with relief. She states she is improved today.    Review of Systems   Objective: Vital Signs: BP 133/79   Pulse 76   Ht 5\' 5"  (1.651 m)   Wt 210 lb (95.3 kg)   BMI 34.95 kg/m   Physical Exam  Constitutional: She is oriented to person, place, and time. She appears well-developed.  HENT:  Head: Normocephalic.  Right Ear: External ear normal.  Left Ear: External ear normal.  Eyes: Pupils are equal, round, and reactive to light.  Neck: No tracheal deviation present. No thyromegaly present.  Cardiovascular: Normal rate.   Pulmonary/Chest: Effort normal.  Abdominal: Soft.  Neurological: She is alert and oriented to person, place, and time.  Skin: Skin is warm and dry.  Psychiatric: She has a normal mood and affect. Her behavior is normal.    Ortho Exam there is no ecchymosis over the left knee. Internal/external  rotation hip is normal she is able do a straight leg raise quad tendon is normal patellar tendons normal. No knee effusion is noted collateral cruciate ligament exam is normal she can flex only about 60 with tightness some popliteal tenderness. For review x-rays reviewed which are negative for fracture minimal degenerative changes. Distal pulses are 2+ and symmetrical. Specialty Comments:  No specialty comments available.  Imaging: No results found. Show images for DG Knee     Complete 4 Views Left  Study Result   CLINICAL DATA:  Left knee gave out. Patient heard a pop while walking on bleachers. Unable to bear weight. O Patient stated any immobilizer. She's had severe pain she tried to flex more than 60. EXAM: LEFT KNEE - COMPLETE 4+ VIEW x-rays are reviewed outside films 4 views which shows minimal degenerative changes and no fracture  COMPARISON:  None.  FINDINGS: Medial femorotibial joint space narrowing is seen with spurring off the medial femoral condyle. There is spurring of the tibial eminence. No acute displaced fracture or joint effusion. No intra-articular loose body. Small phlebolith anterior to the tibial tuberosity.  IMPRESSION: No acute osseous abnormality. Medial femorotibial osteoarthritis with spurring.   Electronically Signed   By: Tollie Eth M.D.   On: 04/04/2016 21:41     PMFS History: Patient Active Problem List   Diagnosis Date Noted  .  Knee strain, left, initial encounter 04/06/2016  . Overactive bladder 03/07/2016  . Vitamin D deficiency 03/07/2016  . Acquired hypothyroidism 06/01/2014  . GERD (gastroesophageal reflux disease) 11/27/2013  . Essential hypertension 11/27/2013  . Mixed hyperlipidemia 10/04/2012  . Shortness of breath 10/04/2012   Past Medical History:  Diagnosis Date  . Essential hypertension 11/27/2013  . GERD (gastroesophageal reflux disease)   . Hyperlipidemia   . Hypothyroidism     Family History  Problem  Relation Age of Onset  . Hyperlipidemia Mother   . Diabetes Mellitus II Mother   . CAD Mother   . CAD Father   . Hyperlipidemia Father     Past Surgical History:  Procedure Laterality Date  . TONSILLECTOMY     Social History   Occupational History  . Not on file.   Social History Main Topics  . Smoking status: Never Smoker  . Smokeless tobacco: Never Used  . Alcohol use No  . Drug use: No  . Sexual activity: Not on file

## 2016-04-20 ENCOUNTER — Ambulatory Visit (INDEPENDENT_AMBULATORY_CARE_PROVIDER_SITE_OTHER): Payer: PPO | Admitting: Orthopaedic Surgery

## 2016-04-20 ENCOUNTER — Encounter (INDEPENDENT_AMBULATORY_CARE_PROVIDER_SITE_OTHER): Payer: Self-pay | Admitting: Orthopaedic Surgery

## 2016-04-20 DIAGNOSIS — S86912A Strain of unspecified muscle(s) and tendon(s) at lower leg level, left leg, initial encounter: Secondary | ICD-10-CM | POA: Diagnosis not present

## 2016-04-20 NOTE — Progress Notes (Signed)
Office Visit Note   Patient: Kelly Barajas           Date of Birth: 03/15/1951           MRN: [id number] Visit Date: 04/20/2016              Requested by: Joette CatchingLeonard Nyland, MD [address] [place], [place] [id number] PCP: Josue HectorNYLAND,LEONARD ROBERT, MD   Assessment & Plan: Visit Diagnoses:  1. Knee strain, left, initial encounter     Plan: Continue her therapy exercises. Continue anti-inflammatories intermittently on this it doesn't bother her stomach. I'll recheck her again in one month. We discussed the medial compartment knee osteoarthritis likely some chondral Kitching that was giving her sharp pain as well as patellofemoral degenerative changes.  Follow-Up Instructions: Return in about 1 month (around 05/20/2016).   Orders:  No orders of the defined types were placed in this encounter.  No orders of the defined types were placed in this encounter.     Procedures: No procedures performed   Clinical Data: No additional findings.   Subjective: Chief Complaint  Patient presents with  . Left Knee - Pain, Follow-up    Patient is here for follow up to left knee pain. She is much better with ibuprofen, rest, and exercises. She feels that compression does help.  She is still not 100% yet.  Patient originally had the popping over knee which is going up the bleachers. X-rays demonstrated medial compartment knee osteoarthritis left knee. She's gotten better with the therapy is making improvement has 60% plus pain relief.  Review of Systems 14 point review of systems updated and is unchanged from 10.617 other than improvement in her left knee symptoms.   Objective: Vital Signs: There were no vitals taken for this visit.  Physical Exam  Constitutional: She is oriented to person, place, and time. She appears well-developed.  HENT:  Head: Normocephalic.  Right Ear: External ear normal.  Left Ear: External ear normal.  Eyes: Pupils are equal, round, and reactive to light.   Neck: No tracheal deviation present. No thyromegaly present.  Cardiovascular: Normal rate.   Pulmonary/Chest: Effort normal.  Abdominal: Soft.  Neurological: She is alert and oriented to person, place, and time.  Skin: Skin is warm and dry.  Psychiatric: She has a normal mood and affect. Her behavior is normal.    Ortho Exam there is mild left knee effusion mild patello-femoral crepitus with knee extension and flexion. She reaches full extension. She's been using a compressive knee sleeve which is helped with the edema. Ankle range of motion hip range of motion is full collateral ligaments are stable. Gait is improved when she first gets up she has the short stripe the first few steps and then can ambulate with nearly normal gait slightly slower than normal and stride length is slightly decreased. Distal pulses are 2+.  Specialty Comments:  No specialty comments available.  Imaging: No results found.   PMFS History: Patient Active Problem List   Diagnosis Date Noted  . Knee strain, left, initial encounter 04/06/2016  . Overactive bladder 03/07/2016  . Vitamin D deficiency 03/07/2016  . Acquired hypothyroidism 06/01/2014  . GERD (gastroesophageal reflux disease) 11/27/2013  . Essential hypertension 11/27/2013  . Mixed hyperlipidemia 10/04/2012  . Shortness of breath 10/04/2012   Past Medical History:  Diagnosis Date  . Essential hypertension 11/27/2013  . GERD (gastroesophageal reflux disease)   . Hyperlipidemia   . Hypothyroidism     Family History  Problem Relation  Age of Onset  . Hyperlipidemia Mother   . Diabetes Mellitus II Mother   . CAD Mother   . CAD Father   . Hyperlipidemia Father     Past Surgical History:  Procedure Laterality Date  . TONSILLECTOMY     Social History   Occupational History  . Not on file.   Social History Main Topics  . Smoking status: Never Smoker  . Smokeless tobacco: Never Used  . Alcohol use No  . Drug use: No  . Sexual  activity: Not on file

## 2016-05-15 DIAGNOSIS — H524 Presbyopia: Secondary | ICD-10-CM | POA: Diagnosis not present

## 2016-05-15 DIAGNOSIS — H52223 Regular astigmatism, bilateral: Secondary | ICD-10-CM | POA: Diagnosis not present

## 2016-05-15 DIAGNOSIS — H43813 Vitreous degeneration, bilateral: Secondary | ICD-10-CM | POA: Diagnosis not present

## 2016-05-18 ENCOUNTER — Ambulatory Visit (INDEPENDENT_AMBULATORY_CARE_PROVIDER_SITE_OTHER): Payer: PPO | Admitting: Orthopaedic Surgery

## 2016-05-18 ENCOUNTER — Encounter (INDEPENDENT_AMBULATORY_CARE_PROVIDER_SITE_OTHER): Payer: Self-pay | Admitting: Orthopaedic Surgery

## 2016-05-18 VITALS — BP 136/83 | HR 71 | Ht 65.0 in | Wt 200.0 lb

## 2016-05-18 DIAGNOSIS — S86912A Strain of unspecified muscle(s) and tendon(s) at lower leg level, left leg, initial encounter: Secondary | ICD-10-CM | POA: Diagnosis not present

## 2016-05-18 NOTE — Progress Notes (Signed)
Office Visit Note   Patient: Kelly HalimCarolyn M Barajas           Date of Birth: 04/28/1951           MRN: [id number] Visit Date: 05/18/2016              Requested by: Joette CatchingLeonard Nyland, MD [address] [place], [place] [id number] PCP: Josue HectorNYLAND,LEONARD ROBERT, MD   Assessment & Plan: Visit Diagnoses:  1. Knee strain, left, initial encounter     Plan: She will continue with exercise program occasionally use ibuprofen most time she can go up and down stairs with reciprocal gait. At times just use the railing go up 1 foot at a time. Pain is improved activity level has increased. She will call if she has increasing symptoms at this point no need for further treatments and she is improved greater than 50%.  Follow-Up Instructions: Return if symptoms worsen or fail to improve.   Orders:  No orders of the defined types were placed in this encounter.  No orders of the defined types were placed in this encounter.     Procedures: No procedures performed   Clinical Data: No additional findings.   Subjective: Chief Complaint  Patient presents with  . Left Knee - Follow-up    Patient returns for one month follow up left knee pain. She states that she is taking ibuprofen and exercising at home. She is a lot better, but states that the knee is still a little tight and she continues to have some difficulty going up steps.    Review of Systems 14 point review of systems updated and is unchanged  Objective: Vital Signs: BP 136/83   Pulse 71   Ht 5\' 5"  (1.651 m)   Wt 200 lb (90.7 kg)   BMI 33.28 kg/m   Physical Exam  Constitutional: She is oriented to person, place, and time. She appears well-developed.  HENT:  Head: Normocephalic.  Right Ear: External ear normal.  Left Ear: External ear normal.  Eyes: Pupils are equal, round, and reactive to light.  Neck: No tracheal deviation present. No thyromegaly present.  Cardiovascular: Normal rate.   Pulmonary/Chest: Effort normal.    Abdominal: Soft.  Musculoskeletal:  Patient has a full left knee extension flexion 120 some crepitus with the extension patellar loading no pain with hip range of motion. Sensation is intact good capillary refill.  Neurological: She is alert and oriented to person, place, and time.  Skin: Skin is warm and dry.  Psychiatric: She has a normal mood and affect. Her behavior is normal.    Ortho Exam  Specialty Comments:  No specialty comments available.  Imaging: No results found.   PMFS History: Patient Active Problem List   Diagnosis Date Noted  . Knee strain, left, initial encounter 04/06/2016  . Overactive bladder 03/07/2016  . Vitamin D deficiency 03/07/2016  . Acquired hypothyroidism 06/01/2014  . GERD (gastroesophageal reflux disease) 11/27/2013  . Essential hypertension 11/27/2013  . Mixed hyperlipidemia 10/04/2012  . Shortness of breath 10/04/2012   Past Medical History:  Diagnosis Date  . Essential hypertension 11/27/2013  . GERD (gastroesophageal reflux disease)   . Hyperlipidemia   . Hypothyroidism     Family History  Problem Relation Age of Onset  . Hyperlipidemia Mother   . Diabetes Mellitus II Mother   . CAD Mother   . CAD Father   . Hyperlipidemia Father     Past Surgical History:  Procedure Laterality Date  . TONSILLECTOMY  Social History   Occupational History  . Not on file.   Social History Main Topics  . Smoking status: Never Smoker  . Smokeless tobacco: Never Used  . Alcohol use No  . Drug use: No  . Sexual activity: Not on file

## 2016-05-31 DIAGNOSIS — J111 Influenza due to unidentified influenza virus with other respiratory manifestations: Secondary | ICD-10-CM | POA: Diagnosis not present

## 2016-05-31 DIAGNOSIS — R509 Fever, unspecified: Secondary | ICD-10-CM | POA: Diagnosis not present

## 2016-09-04 DIAGNOSIS — E039 Hypothyroidism, unspecified: Secondary | ICD-10-CM | POA: Diagnosis not present

## 2016-09-04 DIAGNOSIS — E784 Other hyperlipidemia: Secondary | ICD-10-CM | POA: Diagnosis not present

## 2016-09-05 DIAGNOSIS — E785 Hyperlipidemia, unspecified: Secondary | ICD-10-CM | POA: Diagnosis not present

## 2016-09-05 DIAGNOSIS — E039 Hypothyroidism, unspecified: Secondary | ICD-10-CM | POA: Diagnosis not present

## 2016-09-05 DIAGNOSIS — K219 Gastro-esophageal reflux disease without esophagitis: Secondary | ICD-10-CM | POA: Diagnosis not present

## 2016-09-05 DIAGNOSIS — N3281 Overactive bladder: Secondary | ICD-10-CM | POA: Diagnosis not present

## 2016-09-05 DIAGNOSIS — E669 Obesity, unspecified: Secondary | ICD-10-CM | POA: Insufficient documentation

## 2016-09-05 DIAGNOSIS — Z23 Encounter for immunization: Secondary | ICD-10-CM | POA: Diagnosis not present

## 2016-09-05 DIAGNOSIS — E559 Vitamin D deficiency, unspecified: Secondary | ICD-10-CM | POA: Diagnosis not present

## 2017-04-04 DIAGNOSIS — Z23 Encounter for immunization: Secondary | ICD-10-CM | POA: Diagnosis not present

## 2017-04-19 ENCOUNTER — Encounter (INDEPENDENT_AMBULATORY_CARE_PROVIDER_SITE_OTHER): Payer: Self-pay | Admitting: Orthopaedic Surgery

## 2017-04-19 ENCOUNTER — Ambulatory Visit (INDEPENDENT_AMBULATORY_CARE_PROVIDER_SITE_OTHER): Payer: PPO | Admitting: Orthopaedic Surgery

## 2017-04-19 ENCOUNTER — Ambulatory Visit (INDEPENDENT_AMBULATORY_CARE_PROVIDER_SITE_OTHER): Payer: PPO

## 2017-04-19 VITALS — BP 123/83 | HR 103 | Ht 65.0 in | Wt 205.0 lb

## 2017-04-19 DIAGNOSIS — M25561 Pain in right knee: Secondary | ICD-10-CM | POA: Diagnosis not present

## 2017-04-19 DIAGNOSIS — M2241 Chondromalacia patellae, right knee: Secondary | ICD-10-CM | POA: Diagnosis not present

## 2017-04-19 NOTE — Progress Notes (Signed)
Office Visit Note   Patient: Kelly Barajas           Date of Birth: 08/07/1950           MRN: [id number] Visit Date: 04/19/2017              Requested by: Joette CatchingNyland, Leonard, MD [address] [place], [place] [phone] PCP: Joette CatchingNyland, Leonard, MD   Assessment & Plan: Visit Diagnoses:  1. Acute pain of right knee     Plan: Right knee injection performed which she tolerated well. We discussed activities unload the patellofemoral joint less that she can incorporate in her workout activities. We discussed modification of some exercises she's been doing in the last month.  Follow-Up Instructions: No Follow-up on file.   Orders:  Orders Placed This Encounter  Procedures  . XR KNEE 3 VIEW RIGHT   No orders of the defined types were placed in this encounter.     Procedures: Large Joint Inj: R knee on 04/19/2017 1:27 PM Indications: pain and joint swelling Details: 22 G 1.5 in needle, anterolateral approach  Arthrogram: No  Medications: 40 mg methylPREDNISolone acetate 40 MG/ML; 0.5 mL lidocaine 1 %; 4 mL bupivacaine 0.25 % Outcome: tolerated well, no immediate complications Procedure, treatment alternatives, risks and benefits explained, specific risks discussed. Consent was given by the patient. Immediately prior to procedure a time out was called to verify the correct patient, procedure, equipment, support staff and site/side marked as required. Patient was prepped and draped in the usual sterile fashion.       Clinical Data: No additional findings.   Subjective: Chief Complaint  Patient presents with  . Right Knee - Pain    HPI 66 year old female returns for right knee pain medially. She states it popped when she went up the steps. Is been more painful since then. This occurred 5 days ago on 04/14/2017. She is used rest ice heat and Aleve. She's been doing Pilates and yoga for one month. She's noticed some mild swelling in her knee and pain more medial than lateral.  No true locking. No fever or chills no other joint symptoms.  Review of Systems 14 point review of systems positive for history of hypothyroidism, GERD, vitamin D deficiency, hypertension, mixed hyperlipidemia., Acute strain of her knee, chondromalacia patella right knee. Otherwise negative as it pertains history of present illness.   Objective: Vital Signs: BP 123/83   Pulse (!) 103   Ht 5\' 5"  (1.651 m)   Wt 205 lb (93 kg)   BMI 34.11 kg/m   Physical Exam  Constitutional: She is oriented to person, place, and time. She appears well-developed.  HENT:  Head: Normocephalic.  Right Ear: External ear normal.  Left Ear: External ear normal.  Eyes: Pupils are equal, round, and reactive to light.  Neck: No tracheal deviation present. No thyromegaly present.  Cardiovascular: Normal rate.  Pulmonary/Chest: Effort normal.  Abdominal: Soft.  Neurological: She is alert and oriented to person, place, and time.  Skin: Skin is warm and dry.  Psychiatric: She has a normal mood and affect. Her behavior is normal.    Ortho Exam patient has medial joint line tenderness. Pain with patellofemoral loading and quadriceps contracture. Negative patellar subluxation. Collateral cruciate ligament exam is normal. Copious with the extension of her knee more on the right and left knee. Anterior cruciate ligament PCL exam is normal. Normal hip range of motion negative straight leg raising. Ankle range of motion is normal.  Mild swelling of  the right knee no other swelling of other joints upper and lower extremities. No venous stasis changes. Normal pedal pulses.  Specialty Comments:  No specialty comments available.  Imaging: Xr Knee 3 View Right  Result Date: 04/19/2017 X-rays right knee reviewed after being obtained.  This shows some mild medial joint line narrowing.  No acute changes.  Trace effusion noted. Impression mild medial joint line narrowing without significant spurring or cyst formation.  Negative  for acute changes.    PMFS History: Patient Active Problem List   Diagnosis Date Noted  . Knee strain, left, initial encounter 04/06/2016  . Overactive bladder 03/07/2016  . Vitamin D deficiency 03/07/2016  . Acquired hypothyroidism 06/01/2014  . GERD (gastroesophageal reflux disease) 11/27/2013  . Essential hypertension 11/27/2013  . Mixed hyperlipidemia 10/04/2012  . Shortness of breath 10/04/2012   Past Medical History:  Diagnosis Date  . Essential hypertension 11/27/2013  . GERD (gastroesophageal reflux disease)   . Hyperlipidemia   . Hypothyroidism     Family History  Problem Relation Age of Onset  . Hyperlipidemia Mother   . Diabetes Mellitus II Mother   . CAD Mother   . CAD Father   . Hyperlipidemia Father     Past Surgical History:  Procedure Laterality Date  . TONSILLECTOMY     Social History   Occupational History  . Not on file  Tobacco Use  . Smoking status: Never Smoker  . Smokeless tobacco: Never Used  Substance and Sexual Activity  . Alcohol use: No  . Drug use: No  . Sexual activity: Not on file

## 2017-05-08 ENCOUNTER — Encounter (INDEPENDENT_AMBULATORY_CARE_PROVIDER_SITE_OTHER): Payer: Self-pay | Admitting: Orthopaedic Surgery

## 2017-05-08 MED ORDER — BUPIVACAINE HCL 0.25 % IJ SOLN
4.0000 mL | INTRAMUSCULAR | Status: AC | PRN
  Administered 2017-04-19: 4 mL via INTRA_ARTICULAR

## 2017-05-08 MED ORDER — LIDOCAINE HCL 1 % IJ SOLN
0.5000 mL | INTRAMUSCULAR | Status: AC | PRN
  Administered 2017-04-19: .5 mL

## 2017-05-08 MED ORDER — METHYLPREDNISOLONE ACETATE 40 MG/ML IJ SUSP
40.0000 mg | INTRAMUSCULAR | Status: AC | PRN
  Administered 2017-04-19: 40 mg via INTRA_ARTICULAR

## 2017-05-19 DIAGNOSIS — M25561 Pain in right knee: Secondary | ICD-10-CM | POA: Diagnosis not present

## 2017-05-31 ENCOUNTER — Encounter (INDEPENDENT_AMBULATORY_CARE_PROVIDER_SITE_OTHER): Payer: Self-pay | Admitting: Orthopaedic Surgery

## 2017-05-31 ENCOUNTER — Ambulatory Visit (INDEPENDENT_AMBULATORY_CARE_PROVIDER_SITE_OTHER): Payer: PPO | Admitting: Orthopaedic Surgery

## 2017-05-31 VITALS — BP 144/102 | HR 86 | Ht 65.0 in | Wt 207.0 lb

## 2017-05-31 DIAGNOSIS — M25561 Pain in right knee: Secondary | ICD-10-CM | POA: Diagnosis not present

## 2017-05-31 NOTE — Addendum Note (Signed)
Addended by: Rogers SeedsYEATTS, Corey Laski M on: 05/31/2017 11:19 AM   Modules accepted: Orders

## 2017-05-31 NOTE — Progress Notes (Signed)
Office Visit Note   Patient: Kelly Barajas           Date of Birth: 12/07/1950           MRN: [id number] Visit Date: 05/31/2017              Requested by: Kelly CatchingNyland, Kelly Barajas [address] [place], [place] [id number] PCP: Kelly CatchingNyland, Kelly Barajas   Assessment & Plan: Visit Diagnoses:  1. Right knee pain, unspecified chronicity            Probable degenerative posterior medial meniscal tear.  Plan: We will proceed with the MRI scan right knee rule out medial meniscal tear posteriorly with the repetitive problems  Not  Responsive to exercises, anti-inflammatories topical rubs intra-articular injections, Toradol injection and prednisone injection IM for acute pain and acute walking problem problems.  Office follow-up after MRI scan.  Follow-Up Instructions: No Follow-up on file.   Orders:  No orders of the defined types were placed in this encounter.  No orders of the defined types were placed in this encounter.     Procedures: No procedures performed   Clinical Data: No additional findings.   Subjective: Chief Complaint  Patient presents with  . Right Knee - Pain    HPI 66 year old female returns post knee injection 04/19/2017.  Patient had increased problems with her knee.  She states she is barely able to walk and has right knee posterior medial joint line tenderness.  She had an injection of Toradol had prednisone given with an injection in the buttocks and she states she was at least able to make it home.  Bothers her on a daily basis she has problems when she gets up from a chair and is been catching and bothers her when she sits on the commode.  She has had swelling intermittently.  He can anti-inflammatories topical rubs, icy hot, emu oil etc.  No chills or fever.  Other joint symptoms.  Review of Systems updated unchanged 14 point review of systems from 04/19/2017 office visit.  Objective: Vital Signs: BP (!) 144/102   Pulse 86   Ht 5\' 5"  (1.651 m)   Wt 207 lb  (93.9 kg)   BMI 34.45 kg/m   Physical Exam  Constitutional: She is oriented to person, place, and time. She appears well-developed.  HENT:  Head: Normocephalic.  Right Ear: External ear normal.  Left Ear: External ear normal.  Eyes: Pupils are equal, round, and reactive to light.  Neck: No tracheal deviation present. No thyromegaly present.  Cardiovascular: Normal rate.  Pulmonary/Chest: Effort normal.  Abdominal: Soft.  Neurological: She is alert and oriented to person, place, and time.  Skin: Skin is warm and dry.  Psychiatric: She has a normal mood and affect. Her behavior is normal.    Ortho Exam patient ambulates with a pronounced right knee limp.  Difficulty getting from sitting to standing.  Exquisite posterior medial joint line tenderness from the medial collateral ligament posteriorly.  Crepitus with knee extension pain with hyperextension posterior medial joint line.  No lateral compartment tenderness ACL collateral ligament exam is normal distal pulses are intact no pain with hip range of motion. Specialty Comments:  No specialty comments available.  Imaging: No results found.   PMFS History: Patient Active Problem List   Diagnosis Date Noted  . Knee strain, left, initial encounter 04/06/2016  . Overactive bladder 03/07/2016  . Vitamin D deficiency 03/07/2016  . Acquired hypothyroidism 06/01/2014  . GERD (gastroesophageal reflux disease) 11/27/2013  .  Essential hypertension 11/27/2013  . Mixed hyperlipidemia 10/04/2012  . Shortness of breath 10/04/2012   Past Medical History:  Diagnosis Date  . Essential hypertension 11/27/2013  . GERD (gastroesophageal reflux disease)   . Hyperlipidemia   . Hypothyroidism     Family History  Problem Relation Age of Onset  . Hyperlipidemia Mother   . Diabetes Mellitus II Mother   . CAD Mother   . CAD Father   . Hyperlipidemia Father     Past Surgical History:  Procedure Laterality Date  . TONSILLECTOMY     Social  History   Occupational History  . Not on file  Tobacco Use  . Smoking status: Never Smoker  . Smokeless tobacco: Never Used  Substance and Sexual Activity  . Alcohol use: No  . Drug use: No  . Sexual activity: Not on file

## 2017-06-18 ENCOUNTER — Ambulatory Visit
Admission: RE | Admit: 2017-06-18 | Discharge: 2017-06-18 | Disposition: A | Discharge status: Home / Self Care | Payer: PRIVATE HEALTH INSURANCE | Source: Ambulatory Visit | Attending: Orthopaedic Surgery | Admitting: Orthopaedic Surgery

## 2017-06-18 DIAGNOSIS — M25561 Pain in right knee: Secondary | ICD-10-CM

## 2017-06-28 ENCOUNTER — Ambulatory Visit (INDEPENDENT_AMBULATORY_CARE_PROVIDER_SITE_OTHER): Payer: PPO | Admitting: Orthopaedic Surgery

## 2017-06-28 ENCOUNTER — Encounter (INDEPENDENT_AMBULATORY_CARE_PROVIDER_SITE_OTHER): Payer: Self-pay | Admitting: Orthopaedic Surgery

## 2017-06-28 VITALS — BP 132/93 | HR 102

## 2017-06-28 DIAGNOSIS — S838X1D Sprain of other specified parts of right knee, subsequent encounter: Secondary | ICD-10-CM

## 2017-06-28 NOTE — Progress Notes (Signed)
Office Visit Note   Patient: Kelly Barajas           Date of Birth: 1951/02/15           MRN: [id number] Visit Date: 06/28/2017              Requested by: Joette Catching, MD [address] [place], [place] [address] PCP: Joette Catching, MD   Assessment & Plan: Visit Diagnoses:  1. Acute medial meniscal injury of right knee, subsequent encounter     Plan: We reviewed the MRI scan with the patient I gave her a copy of the report and we reviewed the images.  Acute medial femoral condyle impaction appears to be healing.  Complex posterior medial meniscal fragment tearing subluxed fragment.  We discussed options with her and she like to proceed with outpatient arthroscopy.  Discussed the procedure, postoperative course, exercises.  This would be an outpatient procedure.  Patient's retired and has family available to help her after the procedure.  Follow-Up Instructions: No Follow-up on file.   Orders:  No orders of the defined types were placed in this encounter.  No orders of the defined types were placed in this encounter.     Procedures: No procedures performed   Clinical Data: No additional findings.   Subjective: Chief Complaint  Patient presents with  . Right Knee - Pain    MRI review    HPI 67 year old female returns right knee posterior medial pain and catching.  Is had prednisone, intra-articular injection, anti-inflammatories, ice, heat, emu oil ,therapy exercises with persistent symptoms.  Date of injury was 04/19/2017.  It bothers her at night she has problems with stairs.  MRI scan has been obtained and is available for review.  MRI shows a complex posterior medial meniscal tear of the right knee with some migrated fragments.  Review of Systems a 14 point review of systems updated from 04/19/2017.  Of note is history of hypothyroidism, GERD, vitamin D deficiency, hypertension, mixed hyperlipidemia.  Right knee injury as above.   Objective: Vital Signs:  BP (!) 132/93   Pulse (!) 102   Physical Exam  Constitutional: She is oriented to person, place, and time. She appears well-developed.  HENT:  Head: Normocephalic.  Right Ear: External ear normal.  Left Ear: External ear normal.  Eyes: Pupils are equal, round, and reactive to light.  Neck: No tracheal deviation present. No thyromegaly present.  Cardiovascular: Normal rate.  Pulmonary/Chest: Effort normal.  Abdominal: Soft.  Neurological: She is alert and oriented to person, place, and time.  Skin: Skin is warm and dry.  Psychiatric: She has a normal mood and affect. Her behavior is normal.    Ortho Exam patient continues to ambulate with a right knee limp.  Pulses are normal.  EHL is normal negative popliteal compression test.  Hip joint is normal pes bursa is normal.  Specialty Comments:  No specialty comments available.  Imaging: CLINICAL DATA:  Medial right knee pain since an injury while exercising in November, 2018. Initial encounter.  EXAM: MRI OF THE RIGHT KNEE WITHOUT CONTRAST  TECHNIQUE: Multiplanar, multisequence MR imaging of the knee was performed. No intravenous contrast was administered.  COMPARISON:  None.  FINDINGS: MENISCI  Medial meniscus: There is extensive tearing throughout the posterior horn and body of the medial meniscus. The tear is predominantly horizontal in orientation reaching the meniscal undersurface. The body is extremely degenerated and diminutive. A small flap of meniscus is seen along the inferior aspect of the  root of the posterior horn as on images 2 and 3 of series 7.  Lateral meniscus:  Intact.  LIGAMENTS  Cruciates:  Intact.  Collaterals:  Intact.  CARTILAGE  Patellofemoral: Mild to moderately thinned throughout without focal defect.  Medial: Thinned throughout with some associated joint space narrowing.  Lateral:  Minimally degenerated.  Joint:  Small effusion.  Popliteal Fossa:  Small Baker's  cyst.  Extensor Mechanism:  Intact.  Bones: Mildly impacted subchondral fracture weight-bearing medial femoral condyle with associated marrow edema is identified.  Other: None.  IMPRESSION: Extensive tearing and degeneration throughout the posterior horn and body of the medial meniscus as described above.  Minimally impacted acute or subacute subchondral fracture weight-bearing medial femoral condyle.  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  Small Baker's cyst.   Electronically Signed   By: Drusilla Kannerhomas  Dalessio M.D.   On: 06/18/2017 09:28    PMFS History: Patient Active Problem List   Diagnosis Date Noted  . Knee strain, left, initial encounter 04/06/2016  . Overactive bladder 03/07/2016  . Vitamin D deficiency 03/07/2016  . Acquired hypothyroidism 06/01/2014  . GERD (gastroesophageal reflux disease) 11/27/2013  . Essential hypertension 11/27/2013  . Mixed hyperlipidemia 10/04/2012  . Shortness of breath 10/04/2012   Past Medical History:  Diagnosis Date  . Essential hypertension 11/27/2013  . GERD (gastroesophageal reflux disease)   . Hyperlipidemia   . Hypothyroidism     Family History  Problem Relation Age of Onset  . Hyperlipidemia Mother   . Diabetes Mellitus II Mother   . CAD Mother   . CAD Father   . Hyperlipidemia Father     Past Surgical History:  Procedure Laterality Date  . TONSILLECTOMY     Social History   Occupational History  . Not on file  Tobacco Use  . Smoking status: Never Smoker  . Smokeless tobacco: Never Used  Substance and Sexual Activity  . Alcohol use: No  . Drug use: No  . Sexual activity: Not on file

## 2017-06-29 ENCOUNTER — Encounter (INDEPENDENT_AMBULATORY_CARE_PROVIDER_SITE_OTHER): Payer: Self-pay | Admitting: Orthopaedic Surgery

## 2017-07-16 ENCOUNTER — Encounter: Payer: Self-pay | Admitting: Orthopaedic Surgery

## 2017-07-16 DIAGNOSIS — M94261 Chondromalacia, right knee: Secondary | ICD-10-CM | POA: Diagnosis not present

## 2017-07-16 DIAGNOSIS — S83231A Complex tear of medial meniscus, current injury, right knee, initial encounter: Secondary | ICD-10-CM | POA: Diagnosis not present

## 2017-07-16 DIAGNOSIS — M659 Synovitis and tenosynovitis, unspecified: Secondary | ICD-10-CM | POA: Diagnosis not present

## 2017-07-16 DIAGNOSIS — G8918 Other acute postprocedural pain: Secondary | ICD-10-CM | POA: Diagnosis not present

## 2017-07-16 DIAGNOSIS — X58XXXA Exposure to other specified factors, initial encounter: Secondary | ICD-10-CM | POA: Diagnosis not present

## 2017-07-16 DIAGNOSIS — S83241A Other tear of medial meniscus, current injury, right knee, initial encounter: Secondary | ICD-10-CM | POA: Diagnosis not present

## 2017-07-16 DIAGNOSIS — Y999 Unspecified external cause status: Secondary | ICD-10-CM | POA: Diagnosis not present

## 2017-07-16 HISTORY — PX: KNEE ARTHROSCOPY W/ MENISCAL REPAIR: SHX1877

## 2017-07-26 ENCOUNTER — Ambulatory Visit (INDEPENDENT_AMBULATORY_CARE_PROVIDER_SITE_OTHER): Payer: PPO | Admitting: Surgery

## 2017-07-26 ENCOUNTER — Encounter (INDEPENDENT_AMBULATORY_CARE_PROVIDER_SITE_OTHER): Payer: Self-pay | Admitting: Surgery

## 2017-07-26 DIAGNOSIS — Z9889 Other specified postprocedural states: Secondary | ICD-10-CM

## 2017-07-26 NOTE — Progress Notes (Signed)
   Post-Op Visit Note   Patient: Kelly Barajas           Date of Birth: 04/23/1951           MRN: [id number] Visit Date: 07/26/2017 PCP: Joette CatchingNyland, Leonard, MD   Assessment & Plan:  Chief Complaint:  Chief Complaint  Patient presents with  . Right Knee - Routine Post Op  Patient returns.  About 10 days status post right knee arthroscopy.  Doing well.  Stopped taking pain medication.   Visit Diagnoses:  1. Status post arthroscopy of right knee     Plan: Patient was given home exercises for range of motion and quad strengthening.  Continue ice and elevation and oral NSAID.  Follow-up in 5 weeks for recheck with Dr. Ophelia CharterYates.  Follow-Up Instructions: Return in about 5 weeks (around 08/30/2017) for With Dr. Ophelia CharterYates for recheck.   Orders:  No orders of the defined types were placed in this encounter.  No orders of the defined types were placed in this encounter.   Imaging: No results found.  PMFS History: Patient Active Problem List   Diagnosis Date Noted  . Knee strain, left, initial encounter 04/06/2016  . Overactive bladder 03/07/2016  . Vitamin D deficiency 03/07/2016  . Acquired hypothyroidism 06/01/2014  . GERD (gastroesophageal reflux disease) 11/27/2013  . Essential hypertension 11/27/2013  . Mixed hyperlipidemia 10/04/2012  . Shortness of breath 10/04/2012   Past Medical History:  Diagnosis Date  . Essential hypertension 11/27/2013  . GERD (gastroesophageal reflux disease)   . Hyperlipidemia   . Hypothyroidism     Family History  Problem Relation Age of Onset  . Hyperlipidemia Mother   . Diabetes Mellitus II Mother   . CAD Mother   . CAD Father   . Hyperlipidemia Father     Past Surgical History:  Procedure Laterality Date  . TONSILLECTOMY     Social History   Occupational History  . Not on file  Tobacco Use  . Smoking status: Never Smoker  . Smokeless tobacco: Never Used  Substance and Sexual Activity  . Alcohol use: No  . Drug use: No  .  Sexual activity: Not on file   Exam Gait is somewhat antalgic.  Knee range of motion about 0-90 degrees.  Surgical portals healing well.  No drainage or signs of infection.  Calf nontender.  Neurovascular intact.

## 2017-08-15 ENCOUNTER — Encounter: Payer: Self-pay | Admitting: Family Medicine

## 2017-08-15 ENCOUNTER — Ambulatory Visit (INDEPENDENT_AMBULATORY_CARE_PROVIDER_SITE_OTHER): Payer: PPO | Admitting: Family Medicine

## 2017-08-15 ENCOUNTER — Other Ambulatory Visit: Payer: Self-pay

## 2017-08-15 VITALS — BP 140/84 | HR 92 | Temp 98.0°F

## 2017-08-15 DIAGNOSIS — I10 Essential (primary) hypertension: Secondary | ICD-10-CM | POA: Diagnosis not present

## 2017-08-15 DIAGNOSIS — E039 Hypothyroidism, unspecified: Secondary | ICD-10-CM | POA: Diagnosis not present

## 2017-08-15 DIAGNOSIS — E669 Obesity, unspecified: Secondary | ICD-10-CM | POA: Diagnosis not present

## 2017-08-15 DIAGNOSIS — Z1159 Encounter for screening for other viral diseases: Secondary | ICD-10-CM

## 2017-08-15 DIAGNOSIS — E782 Mixed hyperlipidemia: Secondary | ICD-10-CM

## 2017-08-15 LAB — CBC WITH DIFFERENTIAL/PLATELET
Basophils Absolute: 0 10*3/uL (ref 0.0–0.1)
Basophils Relative: 0.7 % (ref 0.0–3.0)
EOS PCT: 3.5 % (ref 0.0–5.0)
Eosinophils Absolute: 0.2 10*3/uL (ref 0.0–0.7)
HCT: 44 % (ref 36.0–46.0)
Hemoglobin: 15 g/dL (ref 12.0–15.0)
LYMPHS ABS: 1.4 10*3/uL (ref 0.7–4.0)
Lymphocytes Relative: 21.3 % (ref 12.0–46.0)
MCHC: 34.1 g/dL (ref 30.0–36.0)
MCV: 94.4 fl (ref 78.0–100.0)
MONO ABS: 0.7 10*3/uL (ref 0.1–1.0)
MONOS PCT: 10 % (ref 3.0–12.0)
NEUTROS ABS: 4.2 10*3/uL (ref 1.4–7.7)
NEUTROS PCT: 64.5 % (ref 43.0–77.0)
PLATELETS: 279 10*3/uL (ref 150.0–400.0)
RBC: 4.66 Mil/uL (ref 3.87–5.11)
RDW: 13.9 % (ref 11.5–15.5)
WBC: 6.6 10*3/uL (ref 4.0–10.5)

## 2017-08-15 LAB — LIPID PANEL
CHOL/HDL RATIO: 5
CHOLESTEROL: 210 mg/dL — AB (ref 0–200)
HDL: 42.2 mg/dL (ref >39.00)
NonHDL: 167.73
TRIGLYCERIDES: 282 mg/dL — AB (ref 0.0–149.0)
VLDL: 56.4 mg/dL — ABNORMAL HIGH (ref 0.0–40.0)

## 2017-08-15 LAB — COMPREHENSIVE METABOLIC PANEL
ALT: 38 U/L — ABNORMAL HIGH (ref 0–35)
AST: 43 U/L — ABNORMAL HIGH (ref 0–37)
Albumin: 4.4 g/dL (ref 3.5–5.2)
Alkaline Phosphatase: 73 U/L (ref 39–117)
BUN: 16 mg/dL (ref 6–23)
CHLORIDE: 103 meq/L (ref 96–112)
CO2: 27 meq/L (ref 19–32)
Calcium: 10.4 mg/dL (ref 8.4–10.5)
Creatinine, Ser: 0.81 mg/dL (ref 0.40–1.20)
GFR: 74.75 mL/min (ref >60.00)
GLUCOSE: 96 mg/dL (ref 70–99)
POTASSIUM: 4.5 meq/L (ref 3.5–5.1)
Sodium: 139 mEq/L (ref 135–145)
Total Bilirubin: 0.4 mg/dL (ref 0.2–1.2)
Total Protein: 7.1 g/dL (ref 6.0–8.3)

## 2017-08-15 LAB — TSH: TSH: 3.15 u[IU]/mL (ref 0.35–4.50)

## 2017-08-15 LAB — LDL CHOLESTEROL, DIRECT: Direct LDL: 119 mg/dL

## 2017-08-15 MED ORDER — SHINGRIX 50 MCG/0.5ML IM SUSR: 0.5000 mL | Freq: Once | INTRAMUSCULAR | 0 refills | Status: AC

## 2017-08-15 NOTE — Progress Notes (Signed)
Subjective  CC:  Chief Complaint  Patient presents with  . Establish Care    Transfer from [facility], Dr. Joette Catching   . Medication Refill    Thyroid and Cholesterol Meds    HPI: Kelly Barajas is a 66 y.o. female who presents to [facility] today to establish care with me as a new patient.  She has the following concerns or needs:   I reviewed her old records. She is recently retired and 'on a mission' to improve her health. Has 2 daughters and 4 grandchildren. Happy. Lives alone.   Has hypertension, hyperlipidemia and hypothyroidism: all due for recheck and all now well controlled. Has has negative cards eval in past. No h/o diabetes. Exercising and eating better. Needs refills. Not currently on bp meds.   We updated and reviewed the patient's past history in detail and it is documented below.  Patient Active Problem List   Diagnosis Date Noted  . Obesity (BMI 30-39.9) 09/05/2016  . Overactive bladder 03/07/2016  . Vitamin D deficiency 03/07/2016  . Hypothyroidism 06/01/2014  . Gastroesophageal reflux disease without esophagitis 11/27/2013  . Essential hypertension 11/27/2013  . Mixed hyperlipidemia 10/04/2012   Health Maintenance  Topic Date Due  . Hepatitis C Screening  Dec 15, 1950  . MAMMOGRAM  03/26/2018  . DEXA SCAN  09/14/2018  . TETANUS/TDAP  10/17/2021  . COLONOSCOPY  03/26/2025  . INFLUENZA VACCINE  Completed  . PNA vac Low Risk Adult  Completed   Immunization History  Administered Date(s) Administered  . Pneumococcal Conjugate-13 09/05/2016  . Pneumococcal Polysaccharide-23 08/17/2015  . Tdap 10/18/2011   Current Meds  Medication Sig  . aspirin EC 81 MG tablet Take 81 mg by mouth at bedtime.  . Coenzyme Q10 (COQ10) 100 MG CAPS Take by mouth.  . fish oil-omega-3 fatty acids 1000 MG capsule Take 1 g by mouth 4 (four) times daily.   Marland Kitchen levothyroxine (SYNTHROID, LEVOTHROID) 88 MCG tablet Take 88 mcg by mouth daily before  breakfast.  . LIVALO 4 MG TABS   . magnesium gluconate (MAGONATE) 500 MG tablet Take 500 mg by mouth 2 (two) times daily.  . naproxen sodium (ANAPROX) 220 MG tablet Take 440 mg by mouth 2 (two) times daily. For pain  . omeprazole (PRILOSEC) 20 MG capsule Take 20 mg by mouth daily.  . [DISCONTINUED] levothyroxine (SYNTHROID) 88 MCG tablet TAKE 1 TABLET BY MOUTH ONCE DAILY  . [DISCONTINUED] levothyroxine (SYNTHROID) 88 MCG tablet Take 88 mcg by mouth daily before breakfast.  . [DISCONTINUED] levothyroxine (SYNTHROID, LEVOTHROID) 88 MCG tablet Take 88 mcg by mouth daily before breakfast.  . [DISCONTINUED] Levothyroxine Sodium (SYNTHROID PO) Take 88 mcg by mouth daily.    Allergies: Patient has No Known Allergies. Past Medical History Patient  has a past medical history of Arthritis, Essential hypertension (11/27/2013), GERD (gastroesophageal reflux disease), Hyperlipidemia, and Hypothyroidism. Past Surgical History Patient  has a past surgical history that includes Tonsillectomy; Dilation and curettage, diagnostic / therapeutic; and Knee arthroscopy w/ meniscal repair (Right, 07/16/2017). Family History: Patient family history includes Arthritis in her mother; CAD in her father and mother; Colon cancer (age of onset: 35) in her maternal grandmother; Depression in her mother; Diabetes in her mother; Diabetes Mellitus II in her mother; Heart attack in her father; Hyperlipidemia in her father and mother; Hypertension in her brother, mother, and sister; Hypothyroidism in her daughter and daughter. Social History:  Patient  reports that  has never smoked. she has never  used smokeless tobacco. She reports that she does not drink alcohol or use drugs.  Review of Systems: Constitutional: negative for fever or malaise Ophthalmic: negative for photophobia, double vision or loss of vision Cardiovascular: negative for chest pain, dyspnea on exertion, or new LE swelling Respiratory: negative for SOB or  persistent cough Gastrointestinal: negative for abdominal pain, change in bowel habits or melena Genitourinary: negative for dysuria or gross hematuria Musculoskeletal: negative for new gait disturbance or muscular weakness Integumentary: negative for new or persistent rashes Neurological: negative for TIA or stroke symptoms Psychiatric: negative for SI or delusions Allergic/Immunologic: negative for hives  Patient Care Team    Relationship Specialty Notifications Start End  Jonelle SidleMcDowell, Samuel G, MD PCP - General Cardiology  08/15/17   Eldred MangesYates, Mark C, MD Consulting Physician Orthopedic Surgery  08/15/17     Objective  Vitals: BP 140/84   Pulse 92   Temp 98 F (36.7 C)  General:  Well developed, well nourished, no acute distress  Psych:  Alert and oriented,normal mood and affect HEENT:  Normocephalic, atraumatic, non-icteric sclera, PERRL, oropharynx is without mass or exudate, supple neck without adenopathy, mass or thyromegaly Cardiovascular:  RRR without gallop, rub +2/6 systolic murmur, nondisplaced PMI Respiratory:  Good breath sounds bilaterally, CTAB with normal respiratory effort Gastrointestinal: normal bowel sounds, soft, non-tender, no noted masses. No HSM MSK: no deformities, contusions. Joints are without erythema or swelling Skin:  Warm, no rashes or suspicious lesions noted Neurologic:    Mental status is normal. Gross motor and sensory exams are normal. Normal gait  Assessment  1. Acquired hypothyroidism   2. Obesity (BMI 30-39.9)   3. Essential hypertension   4. Mixed hyperlipidemia   5. Need for hepatitis C screening test      Plan   Recheck tsh and refill brand synthroid.   Discussed weight loss, healthy eating and walking program  bp is mildly elevated today;reports typically not this high. Check labs, low salt diet and weight loss. Recheck in 6 months and add meds at that time if indicated.   Check lipids on statin. Check lfts.   Hep c screen today.   Hm  up to date. Needs AWV. rec shingrix.   Follow up:  Return in about 6 months (around 02/15/2018) for follow up Hypertension.  Commons side effects, risks, benefits, and alternatives for medications and treatment plan prescribed today were discussed, and the patient expressed understanding of the given instructions. Patient is instructed to call or message via MyChart if he/she has any questions or concerns regarding our treatment plan. No barriers to understanding were identified. We discussed Red Flag symptoms and signs in detail. Patient expressed understanding regarding what to do in case of urgent or emergency type symptoms.   Medication list was reconciled, printed and provided to the patient in AVS. Patient instructions and summary information was reviewed with the patient as documented in the AVS. This note was prepared with assistance of Dragon voice recognition software. Occasional wrong-word or sound-a-like substitutions may have occurred due to the inherent limitations of voice recognition software  Orders Placed This Encounter  Procedures  . TSH  . Lipid panel  . CBC with Differential/Platelet  . Comprehensive metabolic panel  . Hepatitis C antibody   Meds ordered this encounter  Medications  . SHINGRIX injection    Sig: Inject 0.5 mLs into the muscle once for 1 dose. Please give 2nd dose 2-6 months after first dose    Dispense:  2 each  Refill:  0

## 2017-08-15 NOTE — Patient Instructions (Signed)
Please return in 6 months for blood pressure follow up.   Medicare recommends an Annual Wellness Visit for all patients. Please schedule this to be done with our Nurse Educator, Maudie Mercury. This is an informative "talk" visit; it's goals are to ensure that your health care needs are being met and to give you education regarding avoiding falls, ensuring you are not suffering from depression or problems with memory or thinking, and to educate you on Advance Care Planning. It helps me take good care of you!   It was a pleasure meeting you today! Thank you for choosing [place] to meet your healthcare needs! I truly look forward to working with you. If you have any questions or concerns, please send me a message via Mychart or call the office at [phone].

## 2017-08-16 LAB — HEPATITIS C ANTIBODY
Hepatitis C Ab: NONREACTIVE
SIGNAL TO CUT-OFF: 0.01 (ref <1.00)

## 2017-08-17 ENCOUNTER — Other Ambulatory Visit: Payer: Self-pay | Admitting: Family Medicine

## 2017-08-17 MED ORDER — LIVALO 4 MG PO TABS: 4.0000 mg | ORAL_TABLET | Freq: Every day | ORAL | 3 refills | Status: DC

## 2017-08-17 MED ORDER — SYNTHROID 88 MCG PO TABS: 88.0000 ug | ORAL_TABLET | Freq: Every day | ORAL | 3 refills | Status: DC

## 2017-08-17 NOTE — Progress Notes (Signed)
Please call patient: I have reviewed his/her lab results. Everything is stable. I've refilled her Synthroid and liavalo.   The 10-year ASCVD risk score Denman George(Goff DC Montez HagemanJr., et al., 2013) is: 9.1%   Values used to calculate the score:     Age: [age] years     Sex: Female     Is Non-Hispanic African American: No     Diabetic: No     Tobacco smoker: No     Systolic Blood Pressure: 140 mmHg     Is BP treated: No     HDL Cholesterol: 42.2 mg/dL     Total Cholesterol: 210 mg/dL

## 2017-08-31 ENCOUNTER — Ambulatory Visit (INDEPENDENT_AMBULATORY_CARE_PROVIDER_SITE_OTHER): Payer: PPO | Admitting: Orthopaedic Surgery

## 2017-08-31 ENCOUNTER — Encounter (INDEPENDENT_AMBULATORY_CARE_PROVIDER_SITE_OTHER): Payer: Self-pay | Admitting: Orthopaedic Surgery

## 2017-08-31 VITALS — BP 152/96 | HR 97

## 2017-08-31 DIAGNOSIS — Z9889 Other specified postprocedural states: Secondary | ICD-10-CM

## 2017-08-31 NOTE — Progress Notes (Signed)
   Post-Op Visit Note   Patient: Kelly Barajas           Date of Birth: 11/18/1950           MRN: [id number] Visit Date: 08/31/2017 PCP: Jonelle SidleMcDowell, Samuel G, MD   Assessment & Plan: 5 weeks post right knee posterior partial medial meniscectomy.  She is been doing water therapy is good range of motion her only problem is when she first gets up from a chair she has some aching pain.  She did have some grade 3 changes.  Reviewed intraoperative photos.  She is back to normal activity and can return on a as needed basis.  Patient is happy with her surgical result.  Chief Complaint:  Chief Complaint  Patient presents with  . Right Knee - Follow-up   Visit Diagnoses:  1. Status post medial meniscectomy of knee     Plan: Return as needed.  She has good range of motion arthroscopic portals are healed and she is very happy with the surgical result.  Follow-Up Instructions: Return if symptoms worsen or fail to improve.   Orders:  No orders of the defined types were placed in this encounter.  No orders of the defined types were placed in this encounter.   Imaging: No results found.  PMFS History: Patient Active Problem List   Diagnosis Date Noted  . Obesity (BMI 30-39.9) 09/05/2016  . Overactive bladder 03/07/2016  . Vitamin D deficiency 03/07/2016  . Hypothyroidism 06/01/2014  . Gastroesophageal reflux disease without esophagitis 11/27/2013  . Essential hypertension 11/27/2013  . Mixed hyperlipidemia 10/04/2012   Past Medical History:  Diagnosis Date  . Arthritis   . Essential hypertension 11/27/2013  . GERD (gastroesophageal reflux disease)   . Hyperlipidemia   . Hypothyroidism     Family History  Problem Relation Age of Onset  . Hyperlipidemia Mother   . Diabetes Mellitus II Mother   . CAD Mother   . Hypertension Mother   . Depression Mother   . Diabetes Mother   . Arthritis Mother   . CAD Father   . Hyperlipidemia Father   . Heart attack Father   .  Hypertension Sister   . Hypertension Brother   . Hypothyroidism Daughter   . Colon cancer Maternal Grandmother 80  . Hypothyroidism Daughter     Past Surgical History:  Procedure Laterality Date  . DILATION AND CURETTAGE, DIAGNOSTIC / THERAPEUTIC    . KNEE ARTHROSCOPY W/ MENISCAL REPAIR Right 07/16/2017  . TONSILLECTOMY     Social History   Occupational History  . Occupation: retired Engineer, agriculturalands technical fibers manager  Tobacco Use  . Smoking status: Never Smoker  . Smokeless tobacco: Never Used  Substance and Sexual Activity  . Alcohol use: No  . Drug use: No  . Sexual activity: Never    Birth control/protection: Post-menopausal

## 2018-02-06 ENCOUNTER — Ambulatory Visit: Payer: PPO

## 2018-02-12 ENCOUNTER — Ambulatory Visit (INDEPENDENT_AMBULATORY_CARE_PROVIDER_SITE_OTHER): Payer: PPO | Admitting: Family Medicine

## 2018-02-12 ENCOUNTER — Encounter: Payer: Self-pay | Admitting: Family Medicine

## 2018-02-12 ENCOUNTER — Other Ambulatory Visit: Payer: Self-pay

## 2018-02-12 VITALS — BP 138/80 | Temp 97.9°F | Ht 65.0 in | Wt 217.2 lb

## 2018-02-12 DIAGNOSIS — Z23 Encounter for immunization: Secondary | ICD-10-CM | POA: Diagnosis not present

## 2018-02-12 DIAGNOSIS — Z8679 Personal history of other diseases of the circulatory system: Secondary | ICD-10-CM

## 2018-02-12 DIAGNOSIS — E669 Obesity, unspecified: Secondary | ICD-10-CM

## 2018-02-12 MED ORDER — ZOSTER VAC RECOMB ADJUVANTED 50 MCG/0.5ML IM SUSR: 0.5000 mL | Freq: Once | INTRAMUSCULAR | 0 refills | Status: AC

## 2018-02-12 NOTE — Progress Notes (Signed)
Subjective  CC:  Chief Complaint  Patient presents with  . Hypertension    doing well, no complaints, requests flu shot today     HPI: Kelly Barajas is a 67 y.o. female who presents to the office today to address the problems listed above in the chief complaint. Hypertension f/u: history of htn; no longer on meds. Here for recheck. Feels well. Has gained weight. Discussed diet.   Assessment  1. History of hypertension   2. Obesity (BMI 30-39.9)      Plan    Hypertension f/u: BP control is well controlled. No need for meds at this time. Recheck 6 months.   Discussed low salt low fat diet.   shingrix RX given. Has AWV next week.  Flu shot today  Education regarding management of these chronic disease states was given. Management strategies discussed on successive visits include dietary and exercise recommendations, goals of achieving and maintaining IBW, and lifestyle modifications aiming for adequate sleep and minimizing stressors.   Follow up: Return in about 6 months (around 08/13/2018) for complete physical.  No orders of the defined types were placed in this encounter.  Meds ordered this encounter  Medications  . Zoster Vaccine Adjuvanted [facility]) injection    Sig: Inject 0.5 mLs into the muscle once for 1 dose. Please give 2nd dose 2-6 months after first dose    Dispense:  2 each    Refill:  0      BP Readings from Last 3 Encounters:  02/12/18 138/80  08/31/17 (!) 152/96  08/15/17 140/84   Wt Readings from Last 3 Encounters:  02/12/18 217 lb 3.2 oz (98.5 kg)  05/31/17 207 lb (93.9 kg)  04/19/17 205 lb (93 kg)    Lab Results  Component Value Date   CHOL 210 (H) 08/15/2017   Lab Results  Component Value Date   HDL 42.20 08/15/2017   No results found for: [facility] Lab Results  Component Value Date   TRIG 282.0 (H) 08/15/2017   Lab Results  Component Value Date   CHOLHDL 5 08/15/2017   Lab Results  Component Value Date   LDLDIRECT 119.0  08/15/2017   Lab Results  Component Value Date   CREATININE 0.81 08/15/2017   BUN 16 08/15/2017   NA 139 08/15/2017   K 4.5 08/15/2017   CL 103 08/15/2017   CO2 27 08/15/2017    The 10-year ASCVD risk score Denman George DC Jr., et al., 2013) is: 9.7%   Values used to calculate the score:     Age: 20 years     Sex: Female     Is Non-Hispanic African American: No     Diabetic: No     Tobacco smoker: No     Systolic Blood Pressure: 138 mmHg     Is BP treated: No     HDL Cholesterol: 42.2 mg/dL     Total Cholesterol: 210 mg/dL  I reviewed the patients updated PMH, FH, and SocHx.    Patient Active Problem List   Diagnosis Date Noted  . Obesity (BMI 30-39.9) 09/05/2016  . Overactive bladder 03/07/2016  . Vitamin D deficiency 03/07/2016  . Hypothyroidism 06/01/2014  . Gastroesophageal reflux disease without esophagitis 11/27/2013  . Essential hypertension 11/27/2013  . Mixed hyperlipidemia 10/04/2012    Allergies: Patient has no known allergies.  Social History: Patient  reports that she has never smoked. She has never used smokeless tobacco. She reports that she does not drink alcohol or use drugs.  Current Meds  Medication Sig  . aspirin EC 81 MG tablet Take 81 mg by mouth at bedtime.  . Coenzyme Q10 (COQ10) 100 MG CAPS Take by mouth.  . fish oil-omega-3 fatty acids 1000 MG capsule Take 1 g by mouth 4 (four) times daily.   Marland Kitchen LIVALO 4 MG TABS Take 1 tablet (4 mg total) by mouth at bedtime.  . magnesium gluconate (MAGONATE) 500 MG tablet Take 500 mg by mouth 2 (two) times daily.  . naproxen sodium (ANAPROX) 220 MG tablet Take 440 mg by mouth 2 (two) times daily. For pain  . omeprazole (PRILOSEC) 20 MG capsule Take 20 mg by mouth daily.  Marland Kitchen SYNTHROID 88 MCG tablet Take 1 tablet (88 mcg total) by mouth daily before breakfast.    Review of Systems: Cardiovascular: negative for chest pain, palpitations, leg swelling, orthopnea Respiratory: negative for SOB, wheezing or persistent  cough Gastrointestinal: negative for abdominal pain Genitourinary: negative for dysuria or gross hematuria  Objective  Vitals: BP 138/80   Temp 97.9 F (36.6 C)   Ht 5\' 5"  (1.651 m)   Wt 217 lb 3.2 oz (98.5 kg)   BMI 36.14 kg/m  General: no acute distress  Psych:  Alert and oriented, normal mood and affect HEENT:  Normocephalic, atraumatic, supple neck  Cardiovascular:  RRR with 2/6 systolic murmur. no edema Respiratory:  Good breath sounds bilaterally, CTAB with normal respiratory effort   Commons side effects, risks, benefits, and alternatives for medications and treatment plan prescribed today were discussed, and the patient expressed understanding of the given instructions. Patient is instructed to call or message via MyChart if he/she has any questions or concerns regarding our treatment plan. No barriers to understanding were identified. We discussed Red Flag symptoms and signs in detail. Patient expressed understanding regarding what to do in case of urgent or emergency type symptoms.   Medication list was reconciled, printed and provided to the patient in AVS. Patient instructions and summary information was reviewed with the patient as documented in the AVS. This note was prepared with assistance of Dragon voice recognition software. Occasional wrong-word or sound-a-like substitutions may have occurred due to the inherent limitations of voice recognition software

## 2018-02-12 NOTE — Patient Instructions (Addendum)
Please return in 6 months for your annual complete physical; please come fasting.   If you have any questions or concerns, please don't hesitate to send me a message via MyChart or call the office at [phone]. Thank you for visiting with [place] today! It's our pleasure caring for you.    Fat and Cholesterol Restricted Diet Getting too much fat and cholesterol in your diet may cause health problems. Following this diet helps keep your fat and cholesterol at normal levels. This can keep you from getting sick. What types of fat should I choose?  Choose monosaturated and polyunsaturated fats. These are found in foods such as olive oil, canola oil, flaxseeds, walnuts, almonds, and seeds.  Eat more omega-3 fats. Good choices include salmon, mackerel, sardines, tuna, flaxseed oil, and ground flaxseeds.  Limit saturated fats. These are in animal products such as meats, butter, and cream. They can also be in plant products such as palm oil, palm kernel oil, and coconut oil.  Avoid foods with partially hydrogenated oils in them. These contain trans fats. Examples of foods that have trans fats are stick margarine, some tub margarines, cookies, crackers, and other baked goods. What general guidelines do I need to follow?  Check food labels. Look for the words "trans fat" and "saturated fat."  When preparing a meal: ? Fill half of your plate with vegetables and green salads. ? Fill one fourth of your plate with whole grains. Look for the word "whole" as the first word in the ingredient list. ? Fill one fourth of your plate with lean protein foods.  Eat more foods that have fiber, like apples, carrots, beans, peas, and barley.  Eat more home-cooked foods. Eat less at restaurants and buffets.  Limit or avoid alcohol.  Limit foods high in starch and sugar.  Limit fried foods.  Cook foods without frying them. Baking, boiling, grilling, and broiling are all great options.  Lose weight if you are  overweight. Losing even a small amount of weight can help your overall health. It can also help prevent diseases such as diabetes and heart disease. What foods can I eat? Grains Whole grains, such as whole wheat or whole grain breads, crackers, cereals, and pasta. Unsweetened oatmeal, bulgur, barley, quinoa, or brown rice. Corn or whole wheat flour tortillas. Vegetables Fresh or frozen vegetables (raw, steamed, roasted, or grilled). Green salads. Fruits All fresh, canned (in natural juice), or frozen fruits. Meat and Other Protein Products Ground beef (85% or leaner), grass-fed beef, or beef trimmed of fat. Skinless chicken or [place]. Ground chicken or [place]. Pork trimmed of fat. All fish and seafood. Eggs. Dried beans, peas, or lentils. Unsalted nuts or seeds. Unsalted canned or dry beans. Dairy Low-fat dairy products, such as skim or 1% milk, 2% or reduced-fat cheeses, low-fat ricotta or cottage cheese, or plain low-fat yogurt. Fats and Oils Tub margarines without trans fats. Light or reduced-fat mayonnaise and salad dressings. Avocado. Olive, canola, sesame, or safflower oils. Natural peanut or almond butter (choose ones without added sugar and oil). The items listed above may not be a complete list of recommended foods or beverages. Contact your dietitian for more options. What foods are not recommended? Grains White bread. White pasta. White rice. Cornbread. Bagels, pastries, and croissants. Crackers that contain trans fat. Vegetables White potatoes. Corn. Creamed or fried vegetables. Vegetables in a cheese sauce. Fruits Dried fruits. Canned fruit in light or heavy syrup. Fruit juice. Meat and Other Protein Products Fatty cuts of meat. Ribs,  chicken wings, bacon, sausage, bologna, salami, chitterlings, fatback, hot dogs, bratwurst, and packaged luncheon meats. Liver and organ meats. Dairy Whole or 2% milk, cream, half-and-half, and cream cheese. Whole milk cheeses. Whole-fat or  sweetened yogurt. Full-fat cheeses. Nondairy creamers and whipped toppings. Processed cheese, cheese spreads, or cheese curds. Sweets and Desserts Corn syrup, sugars, honey, and molasses. Candy. Jam and jelly. Syrup. Sweetened cereals. Cookies, pies, cakes, donuts, muffins, and ice cream. Fats and Oils Butter, stick margarine, lard, shortening, ghee, or bacon fat. Coconut, palm kernel, or palm oils. Beverages Alcohol. Sweetened drinks (such as sodas, lemonade, and fruit drinks or punches). The items listed above may not be a complete list of foods and beverages to avoid. Contact your dietitian for more information. This information is not intended to replace advice given to you by your health care provider. Make sure you discuss any questions you have with your health care provider. Document Released: 11/28/2011 Document Revised: 02/03/2016 Document Reviewed: 08/28/2013 Elsevier Interactive Patient Education  [employer].

## 2018-02-20 NOTE — Progress Notes (Signed)
Subjective:   Kelly Barajas is a 67 y.o. female who presents for Medicare Annual (Subsequent) preventive examination.  Review of Systems:  No ROS.  Medicare Wellness Visit. Additional risk factors are reflected in the social history.  Cardiac Risk Factors include: advanced age (>70men, >2 women);obesity (BMI >30kg/m2);dyslipidemia;family history of premature cardiovascular disease   Sleep patterns: Sleeps 6 hours, up to void x 1.  Home Safety/Smoke Alarms: Feels safe in home. Smoke alarms in place.  Living environment; residence and Firearm Safety: Lives alone in 1 story home with basement, has rail.  Seat Belt Safety/Bike Helmet: Wears seat belt.   Female:   Pap-N/A. Pt reports 2016       Mammo-03/26/2017. Ordered today.  Dexa scan-09/14/2015, [facility]). Pt reports normal.  Ordered today.      CCS-03/27/2015, [place] [facility]. Pt reports normal, recall 10 years.      Objective:     Vitals: BP (!) 142/88 (BP Location: Left Arm, Cuff Size: Normal)   Pulse 76   Ht 5\' 5"  (1.651 m)   Wt 214 lb (97.1 kg)   SpO2 97%   BMI 35.61 kg/m   Body mass index is 35.61 kg/m.  Advanced Directives 02/21/2018 04/04/2016  Does Patient Have a Medical Advance Directive? No No  Would patient like information on creating a medical advance directive? Yes (MAU/Ambulatory/Procedural Areas - Information given) -    Tobacco Social History   Tobacco Use  Smoking Status Never Smoker  Smokeless Tobacco Never Used     Counseling given: Not Answered    Past Medical History:  Diagnosis Date  . Arthritis   . Essential hypertension 11/27/2013  . GERD (gastroesophageal reflux disease)   . Hyperlipidemia   . Hypothyroidism    Past Surgical History:  Procedure Laterality Date  . DILATION AND CURETTAGE, DIAGNOSTIC / THERAPEUTIC    . KNEE ARTHROSCOPY W/ MENISCAL REPAIR Right 07/16/2017  . TONSILLECTOMY     Family History  Problem Relation Age of Onset    . Hyperlipidemia Mother   . Diabetes Mellitus II Mother   . CAD Mother   . Hypertension Mother   . Depression Mother   . Diabetes Mother   . Arthritis Mother   . CAD Father   . Hyperlipidemia Father   . Heart attack Father   . Hypertension Sister   . Hypertension Brother   . Hypothyroidism Daughter   . Colon cancer Maternal Grandmother 80  . Hypothyroidism Daughter    Social History   Socioeconomic History  . Marital status: Divorced    Spouse name: Not on file  . Number of children: 2  . Years of education: Not on file  . Highest education level: Not on file  Occupational History  . Occupation: retired Engineer, agricultural  Social Needs  . Financial resource strain: Not on file  . Food insecurity:    Worry: Not on file    Inability: Not on file  . Transportation needs:    Medical: Not on file    Non-medical: Not on file  Tobacco Use  . Smoking status: Never Smoker  . Smokeless tobacco: Never Used  Substance and Sexual Activity  . Alcohol use: No  . Drug use: No  . Sexual activity: Never    Birth control/protection: Post-menopausal  Lifestyle  . Physical activity:    Days per week: Not on file    Minutes per session: Not on file  . Stress: Not on file  Relationships  . Social connections:    Talks on phone: Not on file    Gets together: Not on file    Attends religious service: Not on file    Active member of club or organization: Not on file    Attends meetings of clubs or organizations: Not on file    Relationship status: Not on file  Other Topics Concern  . Not on file  Social History Narrative  . Not on file    Outpatient Encounter Medications as of 02/21/2018  Medication Sig  . aspirin EC 81 MG tablet Take 81 mg by mouth at bedtime.  . Biotin w/ Vitamins C & E (HAIR SKIN & NAILS GUMMIES PO) Take by mouth.  . Coenzyme Q10 (COQ10) 100 MG CAPS Take by mouth.  . fish oil-omega-3 fatty acids 1000 MG capsule Take 1 g by mouth 4 (four) times  daily.   Marland Kitchen LIVALO 4 MG TABS Take 1 tablet (4 mg total) by mouth at bedtime.  . magnesium gluconate (MAGONATE) 500 MG tablet Take 500 mg by mouth 2 (two) times daily.  . naproxen sodium (ANAPROX) 220 MG tablet Take 440 mg by mouth 2 (two) times daily. For pain  . omeprazole (PRILOSEC) 20 MG capsule Take 20 mg by mouth daily.  Marland Kitchen SYNTHROID 88 MCG tablet Take 1 tablet (88 mcg total) by mouth daily before breakfast.   No facility-administered encounter medications on file as of 02/21/2018.     Activities of Daily Living In your present state of health, do you have any difficulty performing the following activities: 02/21/2018 02/12/2018  Hearing? N N  Vision? N N  Difficulty concentrating or making decisions? N N  Walking or climbing stairs? N N  Dressing or bathing? N N  Doing errands, shopping? N N  Preparing Food and eating ? N -  Using the Toilet? N -  In the past six months, have you accidently leaked urine? N -  Do you have problems with loss of bowel control? N -  Managing your Medications? N -  Managing your Finances? N -  Housekeeping or managing your Housekeeping? N -  Some recent data might be hidden    Patient Care Team: Willow Ora, MD as PCP - General (Family Medicine) Eldred Manges, MD as Consulting Physician (Orthopedic Surgery) Obgyn, Gennaro Africa, MD as Consulting Physician (Cardiology)    Assessment:   This is a routine wellness examination for Kelly Barajas.  Exercise Activities and Dietary recommendations Current Exercise Habits: Home exercise routine(Maintains house/yard/pool), Type of exercise: Other - see comments(water exercises), Frequency (Times/Week): 3, Exercise limited by: None identified   Diet (meal preparation, eat out, water intake, caffeinated beverages, dairy products, fruits and vegetables): Drinks milk, water and [employer] (zero sugar)  Breakfast: boiled egg, fruit, coffee, occasional cheese toast Lunch: [place] sandwich and  salad Dinner: protein and vegetables. Avoids fried foods.     Goals    . Patient Stated     Stay active.        Fall Risk Fall Risk  02/21/2018 02/12/2018  Falls in the past year? Yes No  Number falls in past yr: 1 -  Injury with Fall? No -  Follow up Falls prevention discussed -    Depression Screen PHQ 2/9 Scores 02/21/2018 02/12/2018 08/15/2017  PHQ - 2 Score 0 0 0  PHQ- 9 Score - - 0     Cognitive Function MMSE - Mini Mental State Exam 02/21/2018  Orientation to  time 5  Orientation to Place 5  Registration 3  Attention/ Calculation 5  Recall 3  Language- name 2 objects 2  Language- repeat 1  Language- follow 3 step command 3  Language- read & follow direction 1  Write a sentence 1  Copy design 1  Total score 30        Immunization History  Administered Date(s) Administered  . Influenza, High Dose Seasonal PF 02/12/2018  . Pneumococcal Conjugate-13 09/05/2016  . Pneumococcal Polysaccharide-23 08/17/2015, 10/23/2017  . Tdap 10/18/2011     Screening Tests Health Maintenance  Topic Date Due  . MAMMOGRAM  03/26/2018  . DEXA SCAN  09/14/2018  . TETANUS/TDAP  10/17/2021  . COLONOSCOPY  03/26/2025  . INFLUENZA VACCINE  Completed  . Hepatitis C Screening  Completed  . PNA vac Low Risk Adult  Completed        Plan:    Schedule mammogram and bone scan after 03/27/18.   Continue doing brain stimulating activities (puzzles, reading, adult coloring books, staying active) to keep memory sharp.   Bring a copy of your living will and/or healthcare power of attorney to your next office visit.  I have personally reviewed and noted the following in the patient's chart:   . Medical and social history . Use of alcohol, tobacco or illicit drugs  . Current medications and supplements . Functional ability and status . Nutritional status . Physical activity . Advanced directives . List of other physicians . Hospitalizations, surgeries, and ER visits in previous 12  months . Vitals . Screenings to include cognitive, depression, and falls . Referrals and appointments  In addition, I have reviewed and discussed with patient certain preventive protocols, quality metrics, and best practice recommendations. A written personalized care plan for preventive services as well as general preventive health recommendations were provided to patient.     Alysia Penna, RN  02/21/2018

## 2018-02-21 ENCOUNTER — Ambulatory Visit (INDEPENDENT_AMBULATORY_CARE_PROVIDER_SITE_OTHER): Payer: PPO

## 2018-02-21 ENCOUNTER — Other Ambulatory Visit: Payer: Self-pay

## 2018-02-21 VITALS — BP 142/88 | HR 76 | Ht 65.0 in | Wt 214.0 lb

## 2018-02-21 DIAGNOSIS — Z1239 Encounter for other screening for malignant neoplasm of breast: Secondary | ICD-10-CM

## 2018-02-21 DIAGNOSIS — E2839 Other primary ovarian failure: Secondary | ICD-10-CM

## 2018-02-21 DIAGNOSIS — Z0000 Encounter for general adult medical examination without abnormal findings: Secondary | ICD-10-CM | POA: Diagnosis not present

## 2018-02-21 DIAGNOSIS — E669 Obesity, unspecified: Secondary | ICD-10-CM | POA: Diagnosis not present

## 2018-02-21 DIAGNOSIS — Z1231 Encounter for screening mammogram for malignant neoplasm of breast: Secondary | ICD-10-CM

## 2018-02-21 NOTE — Patient Instructions (Addendum)
Schedule mammogram and bone scan after 03/27/18.   Continue doing brain stimulating activities (puzzles, reading, adult coloring books, staying active) to keep memory sharp.   Bring a copy of your living will and/or healthcare power of attorney to your next office visit.   Health Maintenance, Female Adopting a healthy lifestyle and getting preventive care can go a long way to promote health and wellness. Talk with your health care provider about what schedule of regular examinations is right for you. This is a good chance for you to check in with your provider about disease prevention and staying healthy. In between checkups, there are plenty of things you can do on your own. Experts have done a lot of research about which lifestyle changes and preventive measures are most likely to keep you healthy. Ask your health care provider for more information. Weight and diet Eat a healthy diet  Be sure to include plenty of vegetables, fruits, low-fat dairy products, and lean protein.  Do not eat a lot of foods high in solid fats, added sugars, or salt.  Get regular exercise. This is one of the most important things you can do for your health. ? Most adults should exercise for at least 150 minutes each week. The exercise should increase your heart rate and make you sweat (moderate-intensity exercise). ? Most adults should also do strengthening exercises at least twice a week. This is in addition to the moderate-intensity exercise.  Maintain a healthy weight  Body mass index (BMI) is a measurement that can be used to identify possible weight problems. It estimates body fat based on height and weight. Your health care provider can help determine your BMI and help you achieve or maintain a healthy weight.  For females 47 years of age and older: ? A BMI below 18.5 is considered underweight. ? A BMI of 18.5 to 24.9 is normal. ? A BMI of 25 to 29.9 is considered overweight. ? A BMI of 30 and above is  considered obese.  Watch levels of cholesterol and blood lipids  You should start having your blood tested for lipids and cholesterol at 68 years of age, then have this test every 5 years.  You may need to have your cholesterol levels checked more often if: ? Your lipid or cholesterol levels are high. ? You are older than 68 years of age. ? You are at high risk for heart disease.  Cancer screening Lung Cancer  Lung cancer screening is recommended for adults 46-73 years old who are at high risk for lung cancer because of a history of smoking.  A yearly low-dose CT scan of the lungs is recommended for people who: ? Currently smoke. ? Have quit within the past 15 years. ? Have at least a 30-pack-year history of smoking. A pack year is smoking an average of one pack of cigarettes a day for 1 year.  Yearly screening should continue until it has been 15 years since you quit.  Yearly screening should stop if you develop a health problem that would prevent you from having lung cancer treatment.  Breast Cancer  Practice breast self-awareness. This means understanding how your breasts normally appear and feel.  It also means doing regular breast self-exams. Let your health care provider know about any changes, no matter how small.  If you are in your 20s or 30s, you should have a clinical breast exam (CBE) by a health care provider every 1-3 years as part of a regular health exam.  If you are 40 or older, have a CBE every year. Also consider having a breast X-ray (mammogram) every year.  If you have a family history of breast cancer, talk to your health care provider about genetic screening.  If you are at high risk for breast cancer, talk to your health care provider about having an MRI and a mammogram every year.  Breast cancer gene (BRCA) assessment is recommended for women who have family members with BRCA-related cancers. BRCA-related cancers  include: ? Breast. ? Ovarian. ? Tubal. ? Peritoneal cancers.  Results of the assessment will determine the need for genetic counseling and BRCA1 and BRCA2 testing.  Cervical Cancer Your health care provider may recommend that you be screened regularly for cancer of the pelvic organs (ovaries, uterus, and vagina). This screening involves a pelvic examination, including checking for microscopic changes to the surface of your cervix (Pap test). You may be encouraged to have this screening done every 3 years, beginning at age 21.  For women ages 30-65, health care providers may recommend pelvic exams and Pap testing every 3 years, or they may recommend the Pap and pelvic exam, combined with testing for human papilloma virus (HPV), every 5 years. Some types of HPV increase your risk of cervical cancer. Testing for HPV may also be done on women of any age with unclear Pap test results.  Other health care providers may not recommend any screening for nonpregnant women who are considered low risk for pelvic cancer and who do not have symptoms. Ask your health care provider if a screening pelvic exam is right for you.  If you have had past treatment for cervical cancer or a condition that could lead to cancer, you need Pap tests and screening for cancer for at least 20 years after your treatment. If Pap tests have been discontinued, your risk factors (such as having a new sexual partner) need to be reassessed to determine if screening should resume. Some women have medical problems that increase the chance of getting cervical cancer. In these cases, your health care provider may recommend more frequent screening and Pap tests.  Colorectal Cancer  This type of cancer can be detected and often prevented.  Routine colorectal cancer screening usually begins at 68 years of age and continues through 68 years of age.  Your health care provider may recommend screening at an earlier age if you have risk factors  for colon cancer.  Your health care provider may also recommend using home test kits to check for hidden blood in the stool.  A small camera at the end of a tube can be used to examine your colon directly (sigmoidoscopy or colonoscopy). This is done to check for the earliest forms of colorectal cancer.  Routine screening usually begins at age 50.  Direct examination of the colon should be repeated every 5-10 years through 68 years of age. However, you may need to be screened more often if early forms of precancerous polyps or small growths are found.  Skin Cancer  Check your skin from head to toe regularly.  Tell your health care provider about any new moles or changes in moles, especially if there is a change in a mole's shape or color.  Also tell your health care provider if you have a mole that is larger than the size of a pencil eraser.  Always use sunscreen. Apply sunscreen liberally and repeatedly throughout the day.  Protect yourself by wearing long sleeves, pants, a wide-brimmed hat, and   sunglasses whenever you are outside.  Heart disease, diabetes, and high blood pressure  High blood pressure causes heart disease and increases the risk of stroke. High blood pressure is more likely to develop in: ? People who have blood pressure in the high end of the normal range (130-139/85-89 mm Hg). ? People who are overweight or obese. ? People who are African American.  If you are 25-70 years of age, have your blood pressure checked every 3-5 years. If you are 86 years of age or older, have your blood pressure checked every year. You should have your blood pressure measured twice-once when you are at a hospital or clinic, and once when you are not at a hospital or clinic. Record the average of the two measurements. To check your blood pressure when you are not at a hospital or clinic, you can use: ? An automated blood pressure machine at a pharmacy. ? A home blood pressure monitor.  If  you are between 34 years and 33 years old, ask your health care provider if you should take aspirin to prevent strokes.  Have regular diabetes screenings. This involves taking a blood sample to check your fasting blood sugar level. ? If you are at a normal weight and have a low risk for diabetes, have this test once every three years after 68 years of age. ? If you are overweight and have a high risk for diabetes, consider being tested at a younger age or more often. Preventing infection Hepatitis B  If you have a higher risk for hepatitis B, you should be screened for this virus. You are considered at high risk for hepatitis B if: ? You were born in a country where hepatitis B is common. Ask your health care provider which countries are considered high risk. ? Your parents were born in a high-risk country, and you have not been immunized against hepatitis B (hepatitis B vaccine). ? You have HIV or AIDS. ? You use needles to inject street drugs. ? You live with someone who has hepatitis B. ? You have had sex with someone who has hepatitis B. ? You get hemodialysis treatment. ? You take certain medicines for conditions, including cancer, organ transplantation, and autoimmune conditions.  Hepatitis C  Blood testing is recommended for: ? Everyone born from [birth year] through 1966. ? Anyone with known risk factors for hepatitis C.  Sexually transmitted infections (STIs)  You should be screened for sexually transmitted infections (STIs) including gonorrhea and chlamydia if: ? You are sexually active and are younger than 68 years of age. ? You are older than 68 years of age and your health care provider tells you that you are at risk for this type of infection. ? Your sexual activity has changed since you were last screened and you are at an increased risk for chlamydia or gonorrhea. Ask your health care provider if you are at risk.  If you do not have HIV, but are at risk, it may be recommended  that you take a prescription medicine daily to prevent HIV infection. This is called pre-exposure prophylaxis (PrEP). You are considered at risk if: ? You are sexually active and do not regularly use condoms or know the HIV status of your partner(s). ? You take drugs by injection. ? You are sexually active with a partner who has HIV.  Talk with your health care provider about whether you are at high risk of being infected with HIV. If you choose to begin PrEP, you  should first be tested for HIV. You should then be tested every 3 months for as long as you are taking PrEP. Pregnancy  If you are premenopausal and you may become pregnant, ask your health care provider about preconception counseling.  If you may become pregnant, take 400 to 800 micrograms (mcg) of folic acid every day.  If you want to prevent pregnancy, talk to your health care provider about birth control (contraception). Osteoporosis and menopause  Osteoporosis is a disease in which the bones lose minerals and strength with aging. This can result in serious bone fractures. Your risk for osteoporosis can be identified using a bone density scan.  If you are 65 years of age or older, or if you are at risk for osteoporosis and fractures, ask your health care provider if you should be screened.  Ask your health care provider whether you should take a calcium or vitamin D supplement to lower your risk for osteoporosis.  Menopause may have certain physical symptoms and risks.  Hormone replacement therapy may reduce some of these symptoms and risks. Talk to your health care provider about whether hormone replacement therapy is right for you. Follow these instructions at home:  Schedule regular health, dental, and eye exams.  Stay current with your immunizations.  Do not use any tobacco products including cigarettes, chewing tobacco, or electronic cigarettes.  If you are pregnant, do not drink alcohol.  If you are  breastfeeding, limit how much and how often you drink alcohol.  Limit alcohol intake to no more than 1 drink per day for nonpregnant women. One drink equals 12 ounces of beer, 5 ounces of wine, or 1 ounces of hard liquor.  Do not use street drugs.  Do not share needles.  Ask your health care provider for help if you need support or information about quitting drugs.  Tell your health care provider if you often feel depressed.  Tell your health care provider if you have ever been abused or do not feel safe at home. This information is not intended to replace advice given to you by your health care provider. Make sure you discuss any questions you have with your health care provider. Document Released: 12/12/2010 Document Revised: 11/04/2015 Document Reviewed: 03/02/2015 Elsevier Interactive Patient Education  2018 [employer]  

## 2018-02-21 NOTE — Progress Notes (Signed)
I have reviewed the documentation from the recent AWV done by Kim Broome; I agree with the documentation and will follow up on any recommendations or abnormal findings as suggested.  

## 2018-04-12 DIAGNOSIS — M8588 Other specified disorders of bone density and structure, other site: Secondary | ICD-10-CM | POA: Diagnosis not present

## 2018-04-12 DIAGNOSIS — E2839 Other primary ovarian failure: Secondary | ICD-10-CM | POA: Diagnosis not present

## 2018-04-12 LAB — HM DEXA SCAN

## 2018-04-22 DIAGNOSIS — Z1231 Encounter for screening mammogram for malignant neoplasm of breast: Secondary | ICD-10-CM | POA: Diagnosis not present

## 2018-08-10 ENCOUNTER — Other Ambulatory Visit: Payer: Self-pay | Admitting: Family Medicine

## 2018-08-14 ENCOUNTER — Other Ambulatory Visit: Payer: Self-pay

## 2018-08-14 ENCOUNTER — Encounter: Payer: Self-pay | Admitting: Family Medicine

## 2018-08-14 ENCOUNTER — Ambulatory Visit (INDEPENDENT_AMBULATORY_CARE_PROVIDER_SITE_OTHER): Payer: PPO | Admitting: Family Medicine

## 2018-08-14 VITALS — BP 130/84 | HR 95 | Temp 98.7°F | Resp 18 | Ht 64.0 in | Wt 214.0 lb

## 2018-08-14 DIAGNOSIS — K219 Gastro-esophageal reflux disease without esophagitis: Secondary | ICD-10-CM | POA: Diagnosis not present

## 2018-08-14 DIAGNOSIS — E039 Hypothyroidism, unspecified: Secondary | ICD-10-CM

## 2018-08-14 DIAGNOSIS — J101 Influenza due to other identified influenza virus with other respiratory manifestations: Secondary | ICD-10-CM

## 2018-08-14 DIAGNOSIS — E782 Mixed hyperlipidemia: Secondary | ICD-10-CM | POA: Diagnosis not present

## 2018-08-14 DIAGNOSIS — Z0000 Encounter for general adult medical examination without abnormal findings: Secondary | ICD-10-CM | POA: Diagnosis not present

## 2018-08-14 DIAGNOSIS — R6889 Other general symptoms and signs: Secondary | ICD-10-CM | POA: Diagnosis not present

## 2018-08-14 DIAGNOSIS — I10 Essential (primary) hypertension: Secondary | ICD-10-CM

## 2018-08-14 LAB — POCT INFLUENZA A/B
INFLUENZA B, POC: NEGATIVE
Influenza A, POC: POSITIVE — AB

## 2018-08-14 LAB — CBC WITH DIFFERENTIAL/PLATELET
BASOS ABS: 0.1 10*3/uL (ref 0.0–0.1)
Basophils Relative: 0.8 % (ref 0.0–3.0)
EOS ABS: 0.1 10*3/uL (ref 0.0–0.7)
Eosinophils Relative: 1.8 % (ref 0.0–5.0)
HCT: 45.3 % (ref 36.0–46.0)
Hemoglobin: 15.3 g/dL — ABNORMAL HIGH (ref 12.0–15.0)
LYMPHS ABS: 1.2 10*3/uL (ref 0.7–4.0)
LYMPHS PCT: 18.7 % (ref 12.0–46.0)
MCHC: 33.7 g/dL (ref 30.0–36.0)
MCV: 92.3 fl (ref 78.0–100.0)
Monocytes Absolute: 1.1 10*3/uL — ABNORMAL HIGH (ref 0.1–1.0)
Monocytes Relative: 17.6 % — ABNORMAL HIGH (ref 3.0–12.0)
NEUTROS ABS: 3.9 10*3/uL (ref 1.4–7.7)
NEUTROS PCT: 61.1 % (ref 43.0–77.0)
PLATELETS: 244 10*3/uL (ref 150.0–400.0)
RBC: 4.91 Mil/uL (ref 3.87–5.11)
RDW: 14.4 % (ref 11.5–15.5)
WBC: 6.5 10*3/uL (ref 4.0–10.5)

## 2018-08-14 LAB — COMPREHENSIVE METABOLIC PANEL
ALT: 65 U/L — AB (ref 0–35)
AST: 74 U/L — AB (ref 0–37)
Albumin: 4.7 g/dL (ref 3.5–5.2)
Alkaline Phosphatase: 105 U/L (ref 39–117)
BILIRUBIN TOTAL: 0.6 mg/dL (ref 0.2–1.2)
BUN: 13 mg/dL (ref 6–23)
CALCIUM: 10.1 mg/dL (ref 8.4–10.5)
CO2: 26 meq/L (ref 19–32)
CREATININE: 0.9 mg/dL (ref 0.40–1.20)
Chloride: 101 mEq/L (ref 96–112)
GFR: 62.1 mL/min (ref >60.00)
GLUCOSE: 99 mg/dL (ref 70–99)
Potassium: 4.2 mEq/L (ref 3.5–5.1)
Sodium: 139 mEq/L (ref 135–145)
TOTAL PROTEIN: 7.3 g/dL (ref 6.0–8.3)

## 2018-08-14 LAB — LIPID PANEL
CHOL/HDL RATIO: 5
CHOLESTEROL: 186 mg/dL (ref 0–200)
HDL: 39.2 mg/dL (ref >39.00)
LDL Cholesterol: 110 mg/dL — ABNORMAL HIGH (ref 0–99)
NONHDL: 146.95
Triglycerides: 183 mg/dL — ABNORMAL HIGH (ref 0.0–149.0)
VLDL: 36.6 mg/dL (ref 0.0–40.0)

## 2018-08-14 LAB — TSH: TSH: 3.13 u[IU]/mL (ref 0.35–4.50)

## 2018-08-14 MED ORDER — LIVALO 4 MG PO TABS: 4.0000 mg | ORAL_TABLET | Freq: Every day | ORAL | 3 refills | Status: DC

## 2018-08-14 MED ORDER — AZITHROMYCIN 250 MG PO TABS: ORAL_TABLET | ORAL | 0 refills | Status: DC

## 2018-08-14 MED ORDER — OSELTAMIVIR PHOSPHATE 75 MG PO CAPS: 75.0000 mg | ORAL_CAPSULE | Freq: Two times a day (BID) | ORAL | 0 refills | Status: DC

## 2018-08-14 MED ORDER — HYDROCHLOROTHIAZIDE 25 MG PO TABS: 12.5000 mg | ORAL_TABLET | Freq: Every day | ORAL | 3 refills | Status: DC

## 2018-08-14 NOTE — Progress Notes (Signed)
Subjective  Chief Complaint  Patient presents with  . Annual Exam  . URI    Started Sunday.. Cough, chills, fatigue.[name] Advil cold + sinus and rinses..  . Hypothyroidism  . Hyperlipidemia    HPI: Kelly Barajas is a 68 y.o. female who presents to [facility] today for a Female Wellness Visit. She also has the concerns and/or needs as listed above in the chief complaint. These will be addressed in addition to the Health Maintenance Visit.   Wellness Visit: annual visit with health maintenance review and exam without Pap   69 year old here for annual physical.  She does have a gynecologist.  Reports normal mammography in September also had bone density done at that time.  Will call for records.  Had for Shingrix in September.  Due for second now.  Other immunizations are up-to-date.  Obesity: She is trying to work on her diet.  She was able to cut out all salt from her diet.  This is a huge success.  Weight is down 3 pounds.  She will now work on an exercise program.  She does have silver sneakers.  Chronic disease f/u and/or acute problem visit: (deemed necessary to be done in addition to the wellness visit):  Hyperlipidemia on statin which is well-tolerated.  She is fasting today for her lab work.  She does need a refill.  No myalgias.  History of essential hypertension not currently on medications.  I reviewed her home blood pressure log.  Has a handful readings that are above 140/90.  Periodically diastolics are elevated as well.  She has noted less lower extremity swelling since stopping sodium intake.  She denies chest pain, shortness of breath, lower extremity edema or palpitations.  Hypothyroidism due for recheck.  She is on Synthroid and takes it almost 100% of the time.  She feels her thyroid levels are normal.  No symptoms of low or high thyroid.  Her GERD is well controlled on PPI.  Acute problem: Fevers, chills, sore throat, cough and  headache that started 2 to 3 days ago.  She has had an influenza vaccination.  She denies shortness of breath.  She did have dental pain and sinus pressure as well.  No nausea vomiting or diarrhea.  Significant myalgias.  She says she feels awful.  Assessment  1. Annual physical exam   2. Acquired hypothyroidism   3. Mixed hyperlipidemia   4. Essential hypertension   5. Gastroesophageal reflux disease without esophagitis   6. Flu-like symptoms   7. Influenza A      Plan  Female Wellness Visit:  Age appropriate Health Maintenance and Prevention measures were discussed with patient. Included topics are cancer screening recommendations, ways to keep healthy (see AVS) including dietary and exercise recommendations, regular eye and dental care, use of seat belts, and avoidance of moderate alcohol use and tobacco use.  Health screens are up-to-date.  Will get records for mammogram and bone density.  BMI: discussed patient's BMI and encouraged positive lifestyle modifications to help get to or maintain a target BMI.  HM needs and immunizations were addressed and ordered. See below for orders. See HM and immunization section for updates.  Due for second Shingrix.  Patient will get the pharmacy.  Routine labs and screening tests ordered including cmp, cbc and lipids where appropriate.  Discussed recommendations regarding Vit D and calcium supplementation (see AVS)  Chronic disease management visit and/or acute problem visit:  Hypertension: Borderline readings persist,  start low-dose HCTZ.  Education given.  See after visit summary for further education  Hyperlipidemia and hypothyroidism are both controlled.  Here for recheck.  Continue current medications.  Influenza A: Education on supportive care.  Start Tamiflu.  Z-Pak ordered due to symptoms of sinus infection.  She will only take this if sinus infection symptoms persist.  GERD is well controlled. Follow up: Return in about 6 months  (around 02/14/2019) for follow up Hypertension.  Orders Placed This Encounter  Procedures  . Comprehensive metabolic panel  . CBC with Differential/Platelet  . Lipid panel  . TSH   Meds ordered this encounter  Medications  . LIVALO 4 MG TABS    Sig: Take 1 tablet (4 mg total) by mouth at bedtime.    Dispense:  90 tablet    Refill:  3  . azithromycin (ZITHROMAX) 250 MG tablet    Sig: Take 2 tabs today, then 1 tab daily for 4 days    Dispense:  1 each    Refill:  0  . hydrochlorothiazide (HYDRODIURIL) 25 MG tablet    Sig: Take 0.5 tablets (12.5 mg total) by mouth daily.    Dispense:  45 tablet    Refill:  3  . oseltamivir (TAMIFLU) 75 MG capsule    Sig: Take 1 capsule (75 mg total) by mouth 2 (two) times daily.    Dispense:  10 capsule    Refill:  0      Lifestyle: Body mass index is 36.73 kg/m. Wt Readings from Last 3 Encounters:  08/14/18 214 lb (97.1 kg)  02/21/18 214 lb (97.1 kg)  02/12/18 217 lb 3.2 oz (98.5 kg)   Has silver sneakers.   Patient Active Problem List   Diagnosis Date Noted  . Obesity (BMI 30-39.9) 09/05/2016  . Overactive bladder 03/07/2016  . Vitamin D deficiency 03/07/2016  . Hypothyroidism 06/01/2014  . Gastroesophageal reflux disease without esophagitis 11/27/2013  . Essential hypertension 11/27/2013    Negative Stress Test 2017: false positive EKG changes.   . Mixed hyperlipidemia 10/04/2012    Intolerant to crestor and lipitor: myalgias and mm cramps    Health Maintenance  Topic Date Due  . DEXA SCAN  09/14/2018  . MAMMOGRAM  03/13/2019  . TETANUS/TDAP  10/17/2021  . COLONOSCOPY  03/26/2025  . INFLUENZA VACCINE  Completed  . Hepatitis C Screening  Completed  . PNA vac Low Risk Adult  Completed   Immunization History  Administered Date(s) Administered  . Influenza, High Dose Seasonal PF 02/12/2018  . Pneumococcal Conjugate-13 09/05/2016  . Pneumococcal Polysaccharide-23 08/17/2015, 10/23/2017  . Tdap 10/18/2011  . Zoster  Recombinat (Shingrix) 02/28/2018   We updated and reviewed the patient's past history in detail and it is documented below. Allergies: Patient has No Known Allergies. Past Medical History Patient  has a past medical history of Arthritis, Essential hypertension (11/27/2013), GERD (gastroesophageal reflux disease), Hyperlipidemia, and Hypothyroidism. Past Surgical History Patient  has a past surgical history that includes Tonsillectomy; Dilation and curettage, diagnostic / therapeutic; and Knee arthroscopy w/ meniscal repair (Right, 07/16/2017). Family History: Patient family history includes Arthritis in her mother; CAD in her father and mother; Colon cancer (age of onset: 34) in her maternal grandmother; Depression in her mother; Diabetes in her mother; Diabetes Mellitus II in her mother; Heart attack in her father; Hyperlipidemia in her father and mother; Hypertension in her brother, mother, and sister; Hypothyroidism in her daughter and daughter. Social History:  Patient  reports that she has never  smoked. She has never used smokeless tobacco. She reports that she does not drink alcohol or use drugs.  Review of Systems: Constitutional: negative for fever or malaise Ophthalmic: negative for photophobia, double vision or loss of vision Cardiovascular: negative for chest pain, dyspnea on exertion, or new LE swelling Respiratory: negative for SOB or persistent cough Gastrointestinal: negative for abdominal pain, change in bowel habits or melena Genitourinary: negative for dysuria or gross hematuria, no abnormal uterine bleeding or disharge Musculoskeletal: negative for new gait disturbance or muscular weakness Integumentary: negative for new or persistent rashes, no breast lumps Neurological: negative for TIA or stroke symptoms Psychiatric: negative for SI or delusions Allergic/Immunologic: negative for hives  Patient Care Team    Relationship Specialty Notifications Start End  Willow Ora, MD PCP - General Family Medicine  02/12/18   Eldred Manges, MD Consulting Physician Orthopedic Surgery  08/15/17   Obgyn, Ma Hillock    02/21/18   Jonelle Sidle, MD Consulting Physician Cardiology  02/21/18     Objective  Vitals: BP 130/84   Pulse 95   Temp 98.7 F (37.1 C) (Oral)   Resp 18   Ht  (1.626 m)   Wt 214 lb (97.1 kg)   SpO2 94%   BMI 36.73 kg/m  General:  Well developed, well nourished, no acute distress, nontoxic-appearing Psych:  Alert and orientedx3,normal mood and affect HEENT:  Normocephalic, atraumatic, non-icteric sclera, PERRL, oropharynx is mildly erythematous without mass or exudate, supple neck without adenopathy, mass or thyromegaly, no sinus tenderness present Cardiovascular:  Normal S1, S2, RRR without gallop, rub, 2/6 systolic murmur, nondisplaced PMI Respiratory:  Good breath sounds bilaterally, CTAB with normal respiratory effort Gastrointestinal: normal bowel sounds, soft, non-tender, no noted masses. No HSM MSK: no deformities, contusions. Joints are without erythema or swelling. Spine and CVA region are nontender Skin:  Warm, no rashes or suspicious lesions noted Neurologic:    Mental status is normal. CN 2-11 are normal. Gross motor and sensory exams are normal. Normal gait. No tremor Breast Exam: No mass, skin retraction or nipple discharge is appreciated in either breast. No axillary adenopathy. Fibrocystic changes are not noted  Positive influenza A and point-of-care testing today.   Commons side effects, risks, benefits, and alternatives for medications and treatment plan prescribed today were discussed, and the patient expressed understanding of the given instructions. Patient is instructed to call or message via MyChart if he/she has any questions or concerns regarding our treatment plan. No barriers to understanding were identified. We discussed Red Flag symptoms and signs in detail. Patient expressed understanding regarding what to  do in case of urgent or emergency type symptoms.   Medication list was reconciled, printed and provided to the patient in AVS. Patient instructions and summary information was reviewed with the patient as documented in the AVS. This note was prepared with assistance of Dragon voice recognition software. Occasional wrong-word or sound-a-like substitutions may have occurred due to the inherent limitations of voice recognition software

## 2018-08-14 NOTE — Patient Instructions (Addendum)
Please return in 6 months for follow up of your hypertension..  I will release your lab results to you on your MyChart account with further instructions. Please reply with any questions.   Today we are starting a low dose fluid pill to help keep your blood pressure in the normal range.  I've ordered an antibiotic to help you clear your infection. You may use Delsym cough syrup or Mucinex DM to help with congestion and coughing.  Go and get your 2nd Shingrix any time now.   If you have any questions or concerns, please don't hesitate to send me a message via MyChart or call the office at [phone]. Thank you for visiting with Kelly Barajas today! It's our pleasure caring for you.  Please do these things to maintain good health!   Exercise at least 30-45 minutes a day,  4-5 days a week.   Eat a low-fat diet with lots of fruits and vegetables, up to 7-9 servings per day.  Drink plenty of water daily. Try to drink 8 8oz glasses per day.  Seatbelts can save your life. Always wear your seatbelt.  Place Smoke Detectors on every level of your home and check batteries every year.  Schedule an appointment with an eye doctor for an eye exam every 1-2 years  Safe sex - use condoms to protect yourself from STDs if you could be exposed to these types of infections. Use birth control if you do not want to become pregnant and are sexually active.  Avoid heavy alcohol use. If you drink, keep it to less than 2 drinks/day and not every day.  Health Care Power of Attorney.  Choose someone you trust that could speak for you if you became unable to speak for yourself.  Depression is common in our stressful world.If you're feeling down or losing interest in things you normally enjoy, please come in for a visit.  If anyone is threatening or hurting you, please get help. Physical or Emotional Violence is never OK.     Influenza, Adult Influenza, more commonly known as "the flu," is a viral infection that  mainly affects the respiratory tract. The respiratory tract includes organs that help you breathe, such as the lungs, nose, and throat. The flu causes many symptoms similar to the common cold along with high fever and body aches. The flu spreads easily from person to person (is contagious). Getting a flu shot (influenza vaccination) every year is the best way to prevent the flu. What are the causes? This condition is caused by the influenza virus. You can get the virus by:  Breathing in droplets that are in the air from an infected person's cough or sneeze.  Touching something that has been exposed to the virus (has been contaminated) and then touching your mouth, nose, or eyes. What increases the risk? The following factors may make you more likely to get the flu:  Not washing or sanitizing your hands often.  Having close contact with many people during cold and flu season.  Touching your mouth, eyes, or nose without first washing or sanitizing your hands.  Not getting a yearly (annual) flu shot. You may have a higher risk for the flu, including serious problems such as a lung infection (pneumonia), if you:  Are older than 65.  Are pregnant.  Have a weakened disease-fighting system (immune system). You may have a weakened immune system if you: ? Have HIV or AIDS. ? Are undergoing chemotherapy. ? Are taking medicines that reduce (  suppress) the activity of your immune system.  Have a long-term (chronic) illness, such as heart disease, kidney disease, diabetes, or lung disease.  Have a liver disorder.  Are severely overweight (morbidly obese).  Have anemia. This is a condition that affects your red blood cells.  Have asthma. What are the signs or symptoms? Symptoms of this condition usually begin suddenly and last 4-14 days. They may include:  Fever and chills.  Headaches, body aches, or muscle aches.  Sore throat.  Cough.  Runny or stuffy (congested) nose.  Chest  discomfort.  Poor appetite.  Weakness or fatigue.  Dizziness.  Nausea or vomiting. How is this diagnosed? This condition may be diagnosed based on:  Your symptoms and medical history.  A physical exam.  Swabbing your nose or throat and testing the fluid for the influenza virus. How is this treated? If the flu is diagnosed early, you can be treated with medicine that can help reduce how severe the illness is and how long it lasts (antiviral medicine). This may be given by mouth (orally) or through an IV. Taking care of yourself at home can help relieve symptoms. Your health care provider may recommend:  Taking over-the-counter medicines.  Drinking plenty of fluids. In many cases, the flu goes away on its own. If you have severe symptoms or complications, you may be treated in a hospital. Follow these instructions at home: Activity  Rest as needed and get plenty of sleep.  Stay home from work or school as told by your health care provider. Unless you are visiting your health care provider, avoid leaving home until your fever has been gone for 24 hours without taking medicine. Eating and drinking  Take an oral rehydration solution (ORS). This is a drink that is sold at pharmacies and retail stores.  Drink enough fluid to keep your urine pale yellow.  Drink clear fluids in small amounts as you are able. Clear fluids include water, ice chips, diluted fruit juice, and low-calorie sports drinks.  Eat bland, easy-to-digest foods in small amounts as you are able. These foods include bananas, applesauce, rice, lean meats, toast, and crackers.  Avoid drinking fluids that contain a lot of sugar or caffeine, such as energy drinks, regular sports drinks, and soda.  Avoid alcohol.  Avoid spicy or fatty foods. General instructions      Take over-the-counter and prescription medicines only as told by your health care provider.  Use a cool mist humidifier to add humidity to the air  in your home. This can make it easier to breathe.  Cover your mouth and nose when you cough or sneeze.  Wash your hands with soap and water often, especially after you cough or sneeze. If soap and water are not available, use alcohol-based hand sanitizer.  Keep all follow-up visits as told by your health care provider. This is important. How is this prevented?   Get an annual flu shot. You may get the flu shot in late summer, fall, or winter. Ask your health care provider when you should get your flu shot.  Avoid contact with people who are sick during cold and flu season. This is generally fall and winter. Contact a health care provider if:  You develop new symptoms.  You have: ? Chest pain. ? Diarrhea. ? A fever.  Your cough gets worse.  You produce more mucus.  You feel nauseous or you vomit. Get help right away if:  You develop shortness of breath or difficulty breathing.  Your skin or nails turn a bluish color.  You have severe pain or stiffness in your neck.  You develop a sudden headache or sudden pain in your face or ear.  You cannot eat or drink without vomiting. Summary  Influenza, more commonly known as "the flu," is a viral infection that primarily affects your respiratory tract.  Symptoms of the flu usually begin suddenly and last 4-14 days.  Getting an annual flu shot is the best way to prevent getting the flu.  Stay home from work or school as told by your health care provider. Unless you are visiting your health care provider, avoid leaving home until your fever has been gone for 24 hours without taking medicine.  Keep all follow-up visits as told by your health care provider. This is important. This information is not intended to replace advice given to you by your health care provider. Make sure you discuss any questions you have with your health care provider. Document Released: 05/26/2000 Document Revised: 11/14/2017 Document Reviewed:  11/14/2017 Elsevier Interactive Patient Education  2019 [employer]  Hypertension Hypertension, commonly called high blood pressure, is when the force of blood pumping through the arteries is too strong. The arteries are the blood vessels that carry blood from the heart throughout the body. Hypertension forces the heart to work harder to pump blood and may cause arteries to become narrow or stiff. Having untreated or uncontrolled hypertension can cause heart attacks, strokes, kidney disease, and other problems. A blood pressure reading consists of a higher number over a lower number. Ideally, your blood pressure should be below 120/80. The first ("top") number is called the systolic pressure. It is a measure of the pressure in your arteries as your heart beats. The second ("bottom") number is called the diastolic pressure. It is a measure of the pressure in your arteries as the heart relaxes. What are the causes? The cause of this condition is not known. What increases the risk? Some risk factors for high blood pressure are under your control. Others are not. Factors you can change  Smoking.  Having type 2 diabetes mellitus, high cholesterol, or both.  Not getting enough exercise or physical activity.  Being overweight.  Having too much fat, sugar, calories, or salt (sodium) in your diet.  Drinking too much alcohol. Factors that are difficult or impossible to change  Having chronic kidney disease.  Having a family history of high blood pressure.  Age. Risk increases with age.  Race. You may be at higher risk if you are African-American.  Gender. Men are at higher risk than women before age 31. After age 38, women are at higher risk than men.  Having obstructive sleep apnea.  Stress. What are the signs or symptoms? Extremely high blood pressure (hypertensive crisis) may cause:  Headache.  Anxiety.  Shortness of breath.  Nosebleed.  Nausea and vomiting.  Severe  chest pain.  Jerky movements you cannot control (seizures). How is this diagnosed? This condition is diagnosed by measuring your blood pressure while you are seated, with your arm resting on a surface. The cuff of the blood pressure monitor will be placed directly against the skin of your upper arm at the level of your heart. It should be measured at least twice using the same arm. Certain conditions can cause a difference in blood pressure between your right and left arms. Certain factors can cause blood pressure readings to be lower or higher than normal (elevated) for a short period of  time:  When your blood pressure is higher when you are in a health care provider's office than when you are at home, this is called white coat hypertension. Most people with this condition do not need medicines.  When your blood pressure is higher at home than when you are in a health care provider's office, this is called masked hypertension. Most people with this condition may need medicines to control blood pressure. If you have a high blood pressure reading during one visit or you have normal blood pressure with other risk factors:  You may be asked to return on a different day to have your blood pressure checked again.  You may be asked to monitor your blood pressure at home for 1 week or longer. If you are diagnosed with hypertension, you may have other blood or imaging tests to help your health care provider understand your overall risk for other conditions. How is this treated? This condition is treated by making healthy lifestyle changes, such as eating healthy foods, exercising more, and reducing your alcohol intake. Your health care provider may prescribe medicine if lifestyle changes are not enough to get your blood pressure under control, and if:  Your systolic blood pressure is above 130.  Your diastolic blood pressure is above 80. Your personal target blood pressure may vary depending on your  medical conditions, your age, and other factors. Follow these instructions at home: Eating and drinking   Eat a diet that is high in fiber and potassium, and low in sodium, added sugar, and fat. An example eating plan is called the DASH (Dietary Approaches to Stop Hypertension) diet. To eat this way: ? Eat plenty of fresh fruits and vegetables. Try to fill half of your plate at each meal with fruits and vegetables. ? Eat whole grains, such as whole wheat pasta, brown rice, or whole grain bread. Fill about one quarter of your plate with whole grains. ? Eat or drink low-fat dairy products, such as skim milk or low-fat yogurt. ? Avoid fatty cuts of meat, processed or cured meats, and poultry with skin. Fill about one quarter of your plate with lean proteins, such as fish, chicken without skin, beans, eggs, and tofu. ? Avoid premade and processed foods. These tend to be higher in sodium, added sugar, and fat.  Reduce your daily sodium intake. Most people with hypertension should eat less than 1,500 mg of sodium a day.  Limit alcohol intake to no more than 1 drink a day for nonpregnant women and 2 drinks a day for men. One drink equals 12 oz of beer, 5 oz of wine, or 1 oz of hard liquor. Lifestyle   Work with your health care provider to maintain a healthy body weight or to lose weight. Ask what an ideal weight is for you.  Get at least 30 minutes of exercise that causes your heart to beat faster (aerobic exercise) most days of the week. Activities may include walking, swimming, or biking.  Include exercise to strengthen your muscles (resistance exercise), such as pilates or lifting weights, as part of your weekly exercise routine. Try to do these types of exercises for 30 minutes at least 3 days a week.  Do not use any products that contain nicotine or tobacco, such as cigarettes and e-cigarettes. If you need help quitting, ask your health care provider.  Monitor your blood pressure at home as  told by your health care provider.  Keep all follow-up visits as told by your health  care provider. This is important. Medicines  Take over-the-counter and prescription medicines only as told by your health care provider. Follow directions carefully. Blood pressure medicines must be taken as prescribed.  Do not skip doses of blood pressure medicine. Doing this puts you at risk for problems and can make the medicine less effective.  Ask your health care provider about side effects or reactions to medicines that you should watch for. Contact a health care provider if:  You think you are having a reaction to a medicine you are taking.  You have headaches that keep coming back (recurring).  You feel dizzy.  You have swelling in your ankles.  You have trouble with your vision. Get help right away if:  You develop a severe headache or confusion.  You have unusual weakness or numbness.  You feel faint.  You have severe pain in your chest or abdomen.  You vomit repeatedly.  You have trouble breathing. Summary  Hypertension is when the force of blood pumping through your arteries is too strong. If this condition is not controlled, it may put you at risk for serious complications.  Your personal target blood pressure may vary depending on your medical conditions, your age, and other factors. For most people, a normal blood pressure is less than 120/80.  Hypertension is treated with lifestyle changes, medicines, or a combination of both. Lifestyle changes include weight loss, eating a healthy, low-sodium diet, exercising more, and limiting alcohol. This information is not intended to replace advice given to you by your health care provider. Make sure you discuss any questions you have with your health care provider. Document Released: 05/29/2005 Document Revised: 04/26/2016 Document Reviewed: 04/26/2016 Elsevier Interactive Patient Education  2019 [employer].

## 2018-08-14 NOTE — Addendum Note (Signed)
Addended by: Erenest Blank on: 08/14/2018 02:32 PM   Modules accepted: Orders

## 2018-08-16 ENCOUNTER — Telehealth: Payer: Self-pay | Admitting: Family Medicine

## 2018-08-16 NOTE — Telephone Encounter (Unsigned)
Copied from CRM [id number]. Topic: Quick Communication - Lab Results (Clinic Use ONLY) >> Aug 15, 2018  4:27 PM Erenest Blank, CMA wrote: [facility] for pt to return call to get lab results.. [facility] may give results

## 2018-08-16 NOTE — Telephone Encounter (Signed)
Results given and documented in result note. 

## 2018-09-25 ENCOUNTER — Other Ambulatory Visit: Payer: PPO

## 2018-09-25 ENCOUNTER — Other Ambulatory Visit (INDEPENDENT_AMBULATORY_CARE_PROVIDER_SITE_OTHER): Payer: PPO

## 2018-09-25 ENCOUNTER — Other Ambulatory Visit: Payer: Self-pay | Admitting: *Deleted

## 2018-09-25 DIAGNOSIS — R945 Abnormal results of liver function studies: Principal | ICD-10-CM

## 2018-09-25 DIAGNOSIS — H903 Sensorineural hearing loss, bilateral: Secondary | ICD-10-CM | POA: Diagnosis not present

## 2018-09-25 DIAGNOSIS — M545 Low back pain: Secondary | ICD-10-CM | POA: Diagnosis not present

## 2018-09-25 DIAGNOSIS — E7800 Pure hypercholesterolemia, unspecified: Secondary | ICD-10-CM | POA: Diagnosis not present

## 2018-09-25 DIAGNOSIS — R7989 Other specified abnormal findings of blood chemistry: Secondary | ICD-10-CM

## 2018-09-25 LAB — HEPATIC FUNCTION PANEL
ALT: 52 U/L — ABNORMAL HIGH (ref 0–35)
AST: 46 U/L — ABNORMAL HIGH (ref 0–37)
Albumin: 4.2 g/dL (ref 3.5–5.2)
Alkaline Phosphatase: 86 U/L (ref 39–117)
Bilirubin, Direct: 0.1 mg/dL (ref 0.0–0.3)
Total Bilirubin: 0.7 mg/dL (ref 0.2–1.2)
Total Protein: 6.7 g/dL (ref 6.0–8.3)

## 2018-09-27 LAB — HEPATITIS B SURFACE ANTIBODY,QUALITATIVE: Hep B S Ab: NONREACTIVE

## 2018-09-27 LAB — HEPATITIS B SURFACE ANTIGEN: Hepatitis B Surface Ag: NONREACTIVE

## 2018-11-05 ENCOUNTER — Other Ambulatory Visit: Payer: Self-pay | Admitting: Family Medicine

## 2019-02-12 ENCOUNTER — Ambulatory Visit: Payer: PPO | Admitting: Family Medicine

## 2019-02-19 ENCOUNTER — Ambulatory Visit (INDEPENDENT_AMBULATORY_CARE_PROVIDER_SITE_OTHER): Payer: PPO | Admitting: Family Medicine

## 2019-02-19 ENCOUNTER — Other Ambulatory Visit: Payer: Self-pay

## 2019-02-19 ENCOUNTER — Encounter: Payer: Self-pay | Admitting: Family Medicine

## 2019-02-19 VITALS — BP 138/86 | HR 100 | Temp 98.2°F | Resp 16 | Ht 64.0 in | Wt 204.0 lb

## 2019-02-19 DIAGNOSIS — M170 Bilateral primary osteoarthritis of knee: Secondary | ICD-10-CM

## 2019-02-19 DIAGNOSIS — R945 Abnormal results of liver function studies: Secondary | ICD-10-CM

## 2019-02-19 DIAGNOSIS — Z23 Encounter for immunization: Secondary | ICD-10-CM

## 2019-02-19 DIAGNOSIS — E2839 Other primary ovarian failure: Secondary | ICD-10-CM | POA: Diagnosis not present

## 2019-02-19 DIAGNOSIS — I10 Essential (primary) hypertension: Secondary | ICD-10-CM

## 2019-02-19 DIAGNOSIS — Z1239 Encounter for other screening for malignant neoplasm of breast: Secondary | ICD-10-CM

## 2019-02-19 DIAGNOSIS — E669 Obesity, unspecified: Secondary | ICD-10-CM

## 2019-02-19 DIAGNOSIS — R7989 Other specified abnormal findings of blood chemistry: Secondary | ICD-10-CM

## 2019-02-19 MED ORDER — LISINOPRIL-HYDROCHLOROTHIAZIDE 10-12.5 MG PO TABS: 1.0000 | ORAL_TABLET | Freq: Every day | ORAL | 3 refills | Status: DC

## 2019-02-19 NOTE — Patient Instructions (Addendum)
Please return in 6 months for your annual complete physical; please come fasting.  We are changing your blood pressure pill today to lisinopril/hctz, one pill daily. This should help keep your blood pressure at goal: 120s/70s - 135/80.  Keep up with the healthy eating and weight loss. Way to go!!!!  If you have any questions or concerns, please don't hesitate to send me a message via MyChart or call the office at [phone]. Thank you for visiting with Kelly Barajas today! It's our pleasure caring for you.  Today you were given your flu vaccination.   We will call you with information regarding your referral appointment. Mammogram and Dexa screens at the breast center.  If you do not hear from Kelly Barajas within the next 2 weeks, please let me know.

## 2019-02-19 NOTE — Progress Notes (Signed)
Subjective  CC:  Chief Complaint  Patient presents with  . Hypertension  . Elevated Hepatic Enzymes    HPI: Kelly Barajas is a 68 y.o. female who presents to the office today to address the problems listed above in the chief complaint.  Hypertension f/u: 6 mo fu after starting low dose hctz. Control is fair . Pt reports she is doing well. taking medications as instructed, no medication side effects noted, no TIAs, no chest pain on exertion, no dyspnea on exertion, no swelling of ankles. Tolerating med well. Eating better. Weight is down further. Activity/exercise is limiting due to severe bilateral knee oa. Sees dr. Ophelia CharterYates.  She denies adverse effects from his BP medications. Compliance with medication is good.   HM: due mammo and dexa. Flu shot today  Mildly elevated lfts: no sxs. On statin. Obese.   Lab Results  Component Value Date   ALT 52 (H) 09/25/2018   AST 46 (H) 09/25/2018   ALKPHOS 86 09/25/2018   BILITOT 0.7 09/25/2018    Assessment  1. Essential hypertension   2. Elevated LFTs   3. Screening breast examination   4. Hypoestrogenism   5. Need for immunization against influenza   6. Obesity (BMI 30-39.9)   7. Bilateral primary osteoarthritis of knee      Plan    Hypertension f/u: BP control is improved but not yet controlled. Change to lisinopril/hctz/. Praised for weight loss.   Hyperlipidemia f/u: on statin.   LFTs' w/o sxs. On statin. Will monitor. Suspect element of fatty liver.   Ordered mammo and dexa.   Knee OA; reports "bone on bone". F/u with orhto as needed.  Education regarding management of these chronic disease states was given. Management strategies discussed on successive visits include dietary and exercise recommendations, goals of achieving and maintaining IBW, and lifestyle modifications aiming for adequate sleep and minimizing stressors.   Follow up: Return in about 6 months (around 08/19/2019) for complete physical, follow up  Hypertension.  Orders Placed This Encounter  Procedures  . MM DIGITAL SCREENING BILATERAL  . DG Bone Density  . Flu Vaccine QUAD High Dose(Fluad)   Meds ordered this encounter  Medications  . lisinopril-hydrochlorothiazide (ZESTORETIC) 10-12.5 MG tablet    Sig: Take 1 tablet by mouth daily.    Dispense:  90 tablet    Refill:  3      BP Readings from Last 3 Encounters:  02/19/19 138/86  08/14/18 130/84  02/21/18 (!) 142/88   Wt Readings from Last 3 Encounters:  02/19/19 204 lb (92.5 kg)  08/14/18 214 lb (97.1 kg)  02/21/18 214 lb (97.1 kg)    Lab Results  Component Value Date   CHOL 186 08/14/2018   CHOL 210 (H) 08/15/2017   Lab Results  Component Value Date   HDL 39.20 08/14/2018   HDL 42.20 08/15/2017   Lab Results  Component Value Date   LDLCALC 110 (H) 08/14/2018   Lab Results  Component Value Date   TRIG 183.0 (H) 08/14/2018   TRIG 282.0 (H) 08/15/2017   Lab Results  Component Value Date   CHOLHDL 5 08/14/2018   CHOLHDL 5 08/15/2017   Lab Results  Component Value Date   LDLDIRECT 119.0 08/15/2017   Lab Results  Component Value Date   CREATININE 0.90 08/14/2018   BUN 13 08/14/2018   NA 139 08/14/2018   K 4.2 08/14/2018   CL 101 08/14/2018   CO2 26 08/14/2018    The 10-year ASCVD risk  score Mikey Bussing DC Jr., et al., 2013) is: 13.8%   Values used to calculate the score:     Age: 68 years     Sex: Female     Is Non-Hispanic African American: No     Diabetic: No     Tobacco smoker: No     Systolic Blood Pressure: 353 mmHg     Is BP treated: Yes     HDL Cholesterol: 39.2 mg/dL     Total Cholesterol: 186 mg/dL  I reviewed the patients updated PMH, FH, and SocHx.    Patient Active Problem List   Diagnosis Date Noted  . Bilateral primary osteoarthritis of knee 02/19/2019  . Obesity (BMI 30-39.9) 09/05/2016  . Overactive bladder 03/07/2016  . Vitamin D deficiency 03/07/2016  . Hypothyroidism 06/01/2014  . Gastroesophageal reflux disease  without esophagitis 11/27/2013  . Essential hypertension 11/27/2013  . Mixed hyperlipidemia 10/04/2012    Allergies: Patient has no known allergies.  Social History: Patient  reports that she has never smoked. She has never used smokeless tobacco. She reports that she does not drink alcohol or use drugs.  Current Meds  Medication Sig  . aspirin EC 81 MG tablet Take 81 mg by mouth at bedtime.  . Biotin w/ Vitamins C & E (HAIR SKIN & NAILS GUMMIES PO) Take by mouth.  . Coenzyme Q10 (COQ10) 100 MG CAPS Take by mouth.  . fish oil-omega-3 fatty acids 1000 MG capsule Take 1 g by mouth 4 (four) times daily.   Marland Kitchen LIVALO 4 MG TABS Take 1 tablet (4 mg total) by mouth at bedtime.  . magnesium gluconate (MAGONATE) 500 MG tablet Take 500 mg by mouth 2 (two) times daily.  . naproxen sodium (ANAPROX) 220 MG tablet Take 440 mg by mouth 2 (two) times daily. For pain  . omeprazole (PRILOSEC) 20 MG capsule Take 20 mg by mouth daily.  Marland Kitchen SYNTHROID 88 MCG tablet TAKE 1 TABLET BY MOUTH ONCE DAILY BEFORE BREAKFAST  . [DISCONTINUED] hydrochlorothiazide (HYDRODIURIL) 25 MG tablet Take 0.5 tablets (12.5 mg total) by mouth daily.    Review of Systems: Cardiovascular: negative for chest pain, palpitations, leg swelling, orthopnea Respiratory: negative for SOB, wheezing or persistent cough Gastrointestinal: negative for abdominal pain Genitourinary: negative for dysuria or gross hematuria  Objective  Vitals: BP 138/86   Pulse 100   Temp 98.2 F (36.8 C) (Tympanic)   Resp 16   Ht 5\' 4"  (1.626 m)   Wt 204 lb (92.5 kg)   SpO2 98%   BMI 35.02 kg/m  General: no acute distress  Psych:  Alert and oriented, normal mood and affect HEENT:  Normocephalic, atraumatic, supple neck  Cardiovascular:  RRR with systolic murmur. no edema Respiratory:  Good breath sounds bilaterally, CTAB with normal respiratory effort Skin:  Warm, no rashes Neurologic:   Mental status is normal  Commons side effects, risks,  benefits, and alternatives for medications and treatment plan prescribed today were discussed, and the patient expressed understanding of the given instructions. Patient is instructed to call or message via MyChart if he/she has any questions or concerns regarding our treatment plan. No barriers to understanding were identified. We discussed Red Flag symptoms and signs in detail. Patient expressed understanding regarding what to do in case of urgent or emergency type symptoms.   Medication list was reconciled, printed and provided to the patient in AVS. Patient instructions and summary information was reviewed with the patient as documented in the AVS. This note was prepared with  assistance of Systems analyst. Occasional wrong-word or sound-a-like substitutions may have occurred due to the inherent limitations of voice recognition software

## 2019-02-27 ENCOUNTER — Ambulatory Visit: Payer: PPO

## 2019-03-24 ENCOUNTER — Other Ambulatory Visit: Payer: Self-pay

## 2019-03-24 ENCOUNTER — Ambulatory Visit (INDEPENDENT_AMBULATORY_CARE_PROVIDER_SITE_OTHER): Payer: PPO

## 2019-03-24 VITALS — BP 126/74 | Temp 98.4°F | Ht 64.0 in | Wt 204.6 lb

## 2019-03-24 DIAGNOSIS — Z0000 Encounter for general adult medical examination without abnormal findings: Secondary | ICD-10-CM | POA: Diagnosis not present

## 2019-03-24 NOTE — Progress Notes (Signed)
Subjective:   Kelly Barajas is a 68 y.o. female who presents for Medicare Annual (Subsequent) preventive examination.  Review of Systems:   Cardiac Risk Factors include: advanced age (>6055men, 44>65 women);hypertension     Objective:     Vitals: BP 126/74 (BP Location: Left Arm, Patient Position: Sitting, Cuff Size: Normal)   Temp 98.4 F (36.9 C)   Ht 5\' 4"  (1.626 m)   Wt 204 lb 9.6 oz (92.8 kg)   BMI 35.12 kg/m   Body mass index is 35.12 kg/m.  Advanced Directives 03/24/2019 02/21/2018 04/04/2016  Does Patient Have a Medical Advance Directive? No No No  Would patient like information on creating a medical advance directive? Yes (MAU/Ambulatory/Procedural Areas - Information given) Yes (MAU/Ambulatory/Procedural Areas - Information given) -    Tobacco Social History   Tobacco Use  Smoking Status Never Smoker  Smokeless Tobacco Never Used     Counseling given: Not Answered   Clinical Intake:  Pre-visit preparation completed: Yes  Pain : No/denies pain  Diabetes: No  How often do you need to have someone help you when you read instructions, pamphlets, or other written materials from your doctor or pharmacy?: 1 - Never  Interpreter Needed?: No  Information entered by :: Kandis Fantasiaourtney Slade LPN  Past Medical History:  Diagnosis Date  . Arthritis   . Essential hypertension 11/27/2013  . GERD (gastroesophageal reflux disease)   . Hyperlipidemia   . Hypothyroidism    Past Surgical History:  Procedure Laterality Date  . DILATION AND CURETTAGE, DIAGNOSTIC / THERAPEUTIC    . KNEE ARTHROSCOPY W/ MENISCAL REPAIR Right 07/16/2017  . TONSILLECTOMY     Family History  Problem Relation Age of Onset  . Hyperlipidemia Mother   . Diabetes Mellitus II Mother   . CAD Mother   . Hypertension Mother   . Depression Mother   . Diabetes Mother   . Arthritis Mother   . CAD Father   . Hyperlipidemia Father   . Heart attack Father   . Hypertension Sister   .  Hypertension Brother   . Hypothyroidism Daughter   . Colon cancer Maternal Grandmother 80  . Hypothyroidism Daughter    Social History   Socioeconomic History  . Marital status: Divorced    Spouse name: Not on file  . Number of children: 2  . Years of education: Not on file  . Highest education level: Not on file  Occupational History  . Occupation: retired Engineer, agriculturalands technical fibers manager  Social Needs  . Financial resource strain: Not on file  . Food insecurity    Worry: Not on file    Inability: Not on file  . Transportation needs    Medical: Not on file    Non-medical: Not on file  Tobacco Use  . Smoking status: Never Smoker  . Smokeless tobacco: Never Used  Substance and Sexual Activity  . Alcohol use: No  . Drug use: No  . Sexual activity: Never    Birth control/protection: Post-menopausal  Lifestyle  . Physical activity    Days per week: Not on file    Minutes per session: Not on file  . Stress: Not on file  Relationships  . Social Musicianconnections    Talks on phone: Not on file    Gets together: Not on file    Attends religious service: Not on file    Active member of club or organization: Not on file    Attends meetings of clubs or organizations:  Not on file    Relationship status: Not on file  Other Topics Concern  . Not on file  Social History Narrative  . Not on file    Outpatient Encounter Medications as of 03/24/2019  Medication Sig  . aspirin EC 81 MG tablet Take 81 mg by mouth at bedtime.  . Biotin w/ Vitamins C & E (HAIR SKIN & NAILS GUMMIES PO) Take by mouth.  . Coenzyme Q10 (COQ10) 100 MG CAPS Take by mouth.  . fish oil-omega-3 fatty acids 1000 MG capsule Take 1 g by mouth 4 (four) times daily.   Marland Kitchen lisinopril-hydrochlorothiazide (ZESTORETIC) 10-12.5 MG tablet Take 1 tablet by mouth daily.  Marland Kitchen LIVALO 4 MG TABS Take 1 tablet (4 mg total) by mouth at bedtime.  . magnesium gluconate (MAGONATE) 500 MG tablet Take 500 mg by mouth 2 (two) times daily.  .  naproxen sodium (ANAPROX) 220 MG tablet Take 440 mg by mouth 2 (two) times daily. For pain  . omeprazole (PRILOSEC) 20 MG capsule Take 20 mg by mouth daily.  Marland Kitchen SYNTHROID 88 MCG tablet TAKE 1 TABLET BY MOUTH ONCE DAILY BEFORE BREAKFAST   No facility-administered encounter medications on file as of 03/24/2019.     Activities of Daily Living In your present state of health, do you have any difficulty performing the following activities: 03/24/2019 08/14/2018  Hearing? N N  Vision? N N  Difficulty concentrating or making decisions? N N  Walking or climbing stairs? N N  Dressing or bathing? N N  Doing errands, shopping? N N  Preparing Food and eating ? N -  Using the Toilet? N -  In the past six months, have you accidently leaked urine? N -  Do you have problems with loss of bowel control? N -  Managing your Medications? N -  Managing your Finances? N -  Housekeeping or managing your Housekeeping? N -  Some recent data might be hidden    Patient Care Team: Willow Ora, MD as PCP - General (Family Medicine) Eldred Manges, MD as Consulting Physician (Orthopedic Surgery) Obgyn, Desma Paganini, Illene Bolus, MD as Consulting Physician (Cardiology) Derryl Harbor, OD as Consulting Physician (Optometry)    Assessment:   This is a routine wellness examination for Kelly Barajas.  Exercise Activities and Dietary recommendations Current Exercise Habits: The patient does not participate in regular exercise at present  Goals    . Patient Stated     Stay active.        Fall Risk Fall Risk  03/24/2019 08/14/2018 02/21/2018 02/12/2018  Falls in the past year? 0 0 Yes No  Number falls in past yr: - 0 1 -  Injury with Fall? 0 0 No -  Follow up Falls evaluation completed;Education provided;Falls prevention discussed Falls evaluation completed Falls prevention discussed -   Is the patient's home free of loose throw rugs in walkways, pet beds, electrical cords, etc?   yes      Grab bars in the  bathroom? yes      Handrails on the stairs?   yes      Adequate lighting?   yes  Timed Get Up and Go performed: completed and within normal timeframe; no gait abnormalities noted    Depression Screen PHQ 2/9 Scores 03/24/2019 08/14/2018 02/21/2018 02/12/2018  PHQ - 2 Score 0 0 0 0  PHQ- 9 Score - - - -     Cognitive Function-no cognitive concerns at this time  MMSE - Mini Mental State  Exam 03/24/2019 02/21/2018  Orientation to time 5 5  Orientation to Place 5 5  Registration 3 3  Attention/ Calculation 5 5  Recall 3 3  Language- name 2 objects 2 2  Language- repeat 1 1  Language- follow 3 step command 3 3  Language- read & follow direction 1 1  Write a sentence 1 1  Copy design 1 1  Total score 30 30      Immunization History  Administered Date(s) Administered  . Fluad Quad(high Dose 65+) 02/19/2019  . Influenza, High Dose Seasonal PF 02/12/2018  . Pneumococcal Conjugate-13 09/05/2016  . Pneumococcal Polysaccharide-23 08/17/2015, 10/23/2017  . Tdap 10/18/2011  . Zoster Recombinat (Shingrix) 02/28/2018, 08/28/2018    Qualifies for Shingles Vaccine? Shingrix completed   Screening Tests Health Maintenance  Topic Date Due  . DEXA SCAN  09/14/2018  . MAMMOGRAM  03/13/2019  . TETANUS/TDAP  10/17/2021  . COLONOSCOPY  03/26/2025  . INFLUENZA VACCINE  Completed  . Hepatitis C Screening  Completed  . PNA vac Low Risk Adult  Completed    Cancer Screenings: Lung: Low Dose CT Chest recommended if Age 43-80 years, 30 pack-year currently smoking OR have quit w/in 15years. Patient does not qualify. Breast:  Up to date on Mammogram? Yes; scheduled for 2020 Up to date of Bone Density/Dexa? Yes; last in 2019 (requesting records) Colorectal: colonoscopy 04/06/15 @ [facility]:  I have personally reviewed and addressed the Medicare Annual Wellness questionnaire and have noted the following in the patient's chart:  A. Medical and social history B. Use of alcohol,  tobacco or illicit drugs  C. Current medications and supplements D. Functional ability and status E.  Nutritional status F.  Physical activity G. Advance directives H. List of other physicians I.  Hospitalizations, surgeries, and ER visits in previous 12 months J.  [place] such as hearing and vision if needed, cognitive and depression L. Referrals, records requested, and appointments- requesting results of last Dexa   In addition, I have reviewed and discussed with patient certain preventive protocols, quality metrics, and best practice recommendations. A written personalized care plan for preventive services as well as general preventive health recommendations were provided to patient.   Signed,  Denman George, LPN  Nurse Health Advisor   Nurse Notes: No additional

## 2019-03-24 NOTE — Patient Instructions (Addendum)
Kelly Barajas , Thank you for taking time to come for your Medicare Wellness Visit. I appreciate your ongoing commitment to your health goals. Please review the following plan we discussed and let me know if I can assist you in the future.   Screening recommendations/referrals: Colorectal Screening: completed 04/06/15  Mammogram: up to date; last 04/2018- scheduled for 2020 Bone Density: up to date; last in 2019 (we will request records)   Vision and Dental Exams: Recommended annual ophthalmology exams for early detection of glaucoma and other disorders of the eye Recommended annual dental exams for proper oral hygiene  Vaccinations: Influenza vaccine: completed 02/19/19 Pneumococcal vaccine: up to date; last 10/23/17 Tdap vaccine: up to date; last 10/18/11 Shingles vaccine: Shingrix completed   Advanced directives: Advance directives discussed with you today. I have provided a copy for you to complete at home and have notarized. Once this is complete please bring a copy in to our office so we can scan it into your chart.  Goals: Recommend to drink at least 6-8 8oz glasses of water per day.  Next appointment: Please schedule your Annual Wellness Visit with your Nurse Health Advisor in one year.  Preventive Care 30 Years and Older, Female Preventive care refers to lifestyle choices and visits with your health care provider that can promote health and wellness. What does preventive care include?  A yearly physical exam. This is also called an annual well check.  Dental exams once or twice a year.  Routine eye exams. Ask your health care provider how often you should have your eyes checked.  Personal lifestyle choices, including:  Daily care of your teeth and gums.  Regular physical activity.  Eating a healthy diet.  Avoiding tobacco and drug use.  Limiting alcohol use.  Practicing safe sex.  Taking low-dose aspirin every day if recommended by your health care provider.   Taking vitamin and mineral supplements as recommended by your health care provider. What happens during an annual well check? The services and screenings done by your health care provider during your annual well check will depend on your age, overall health, lifestyle risk factors, and family history of disease. Counseling  Your health care provider may ask you questions about your:  Alcohol use.  Tobacco use.  Drug use.  Emotional well-being.  Home and relationship well-being.  Sexual activity.  Eating habits.  History of falls.  Memory and ability to understand (cognition).  Work and work Statistician.  Reproductive health. Screening  You may have the following tests or measurements:  Height, weight, and BMI.  Blood pressure.  Lipid and cholesterol levels. These may be checked every 5 years, or more frequently if you are over 67 years old.  Skin check.  Lung cancer screening. You may have this screening every year starting at age 80 if you have a 30-pack-year history of smoking and currently smoke or have quit within the past 15 years.  Fecal occult blood test (FOBT) of the stool. You may have this test every year starting at age 5.  Flexible sigmoidoscopy or colonoscopy. You may have a sigmoidoscopy every 5 years or a colonoscopy every 10 years starting at age 76.  Hepatitis C blood test.  Hepatitis B blood test.  Sexually transmitted disease (STD) testing.  Diabetes screening. This is done by checking your blood sugar (glucose) after you have not eaten for a while (fasting). You may have this done every 1-3 years.  Bone density scan. This is done to screen for  osteoporosis. You may have this done starting at age 11.  Mammogram. This may be done every 1-2 years. Talk to your health care provider about how often you should have regular mammograms. Talk with your health care provider about your test results, treatment options, and if necessary, the need for  more tests. Vaccines  Your health care provider may recommend certain vaccines, such as:  Influenza vaccine. This is recommended every year.  Tetanus, diphtheria, and acellular pertussis (Tdap, Td) vaccine. You may need a Td booster every 10 years.  Zoster vaccine. You may need this after age 64.  Pneumococcal 13-valent conjugate (PCV13) vaccine. One dose is recommended after age 85.  Pneumococcal polysaccharide (PPSV23) vaccine. One dose is recommended after age 46. Talk to your health care provider about which screenings and vaccines you need and how often you need them. This information is not intended to replace advice given to you by your health care provider. Make sure you discuss any questions you have with your health care provider. Document Released: 06/25/2015 Document Revised: 02/16/2016 Document Reviewed: 03/30/2015 Elsevier Interactive Patient Education  2017 [place] Prevention in the Home Falls can cause injuries. They can happen to people of all ages. There are many things you can do to make your home safe and to help prevent falls. What can I do on the outside of my home?  Regularly fix the edges of walkways and driveways and fix any cracks.  Remove anything that might make you trip as you walk through a door, such as a raised step or threshold.  Trim any bushes or trees on the path to your home.  Use bright outdoor lighting.  Clear any walking paths of anything that might make someone trip, such as rocks or tools.  Regularly check to see if handrails are loose or broken. Make sure that both sides of any steps have handrails.  Any raised decks and porches should have guardrails on the edges.  Have any leaves, snow, or ice cleared regularly.  Use sand or salt on walking paths during winter.  Clean up any spills in your garage right away. This includes oil or grease spills. What can I do in the bathroom?  Use night lights.  Install grab bars by  the toilet and in the tub and shower. Do not use towel bars as grab bars.  Use non-skid mats or decals in the tub or shower.  If you need to sit down in the shower, use a plastic, non-slip stool.  Keep the floor dry. Clean up any water that spills on the floor as soon as it happens.  Remove soap buildup in the tub or shower regularly.  Attach bath mats securely with double-sided non-slip rug tape.  Do not have throw rugs and other things on the floor that can make you trip. What can I do in the bedroom?  Use night lights.  Make sure that you have a light by your bed that is easy to reach.  Do not use any sheets or blankets that are too big for your bed. They should not hang down onto the floor.  Have a firm chair that has side arms. You can use this for support while you get dressed.  Do not have throw rugs and other things on the floor that can make you trip. What can I do in the kitchen?  Clean up any spills right away.  Avoid walking on wet floors.  Keep items that you use  a lot in easy-to-reach places.  If you need to reach something above you, use a strong step stool that has a grab bar.  Keep electrical cords out of the way.  Do not use floor polish or wax that makes floors slippery. If you must use wax, use non-skid floor wax.  Do not have throw rugs and other things on the floor that can make you trip. What can I do with my stairs?  Do not leave any items on the stairs.  Make sure that there are handrails on both sides of the stairs and use them. Fix handrails that are broken or loose. Make sure that handrails are as long as the stairways.  Check any carpeting to make sure that it is firmly attached to the stairs. Fix any carpet that is loose or worn.  Avoid having throw rugs at the top or bottom of the stairs. If you do have throw rugs, attach them to the floor with carpet tape.  Make sure that you have a light switch at the top of the stairs and the bottom  of the stairs. If you do not have them, ask someone to add them for you. What else can I do to help prevent falls?  Wear shoes that:  Do not have high heels.  Have rubber bottoms.  Are comfortable and fit you well.  Are closed at the toe. Do not wear sandals.  If you use a stepladder:  Make sure that it is fully opened. Do not climb a closed stepladder.  Make sure that both sides of the stepladder are locked into place.  Ask someone to hold it for you, if possible.  Clearly mark and make sure that you can see:  Any grab bars or handrails.  First and last steps.  Where the edge of each step is.  Use tools that help you move around (mobility aids) if they are needed. These include:  Canes.  Walkers.  Scooters.  Crutches.  Turn on the lights when you go into a dark area. Replace any light bulbs as soon as they burn out.  Set up your furniture so you have a clear path. Avoid moving your furniture around.  If any of your floors are uneven, fix them.  If there are any pets around you, be aware of where they are.  Review your medicines with your doctor. Some medicines can make you feel dizzy. This can increase your chance of falling. Ask your doctor what other things that you can do to help prevent falls. This information is not intended to replace advice given to you by your health care provider. Make sure you discuss any questions you have with your health care provider. Document Released: 03/25/2009 Document Revised: 11/04/2015 Document Reviewed: 07/03/2014 Elsevier Interactive Patient Education  2017 [employer].

## 2019-03-24 NOTE — Progress Notes (Signed)
I have reviewed the documentation from the recent AWV done by Courtney Slade, RN; I agree with the documentation and will follow up on any recommendations or abnormal findings as suggested.  

## 2019-03-28 ENCOUNTER — Encounter: Payer: Self-pay | Admitting: Family Medicine

## 2019-05-01 ENCOUNTER — Ambulatory Visit
Admission: RE | Admit: 2019-05-01 | Discharge: 2019-05-01 | Disposition: A | Discharge status: Home / Self Care | Payer: PPO | Source: Ambulatory Visit | Attending: Family Medicine | Admitting: Family Medicine

## 2019-05-01 ENCOUNTER — Other Ambulatory Visit: Payer: Self-pay

## 2019-05-01 DIAGNOSIS — Z1239 Encounter for other screening for malignant neoplasm of breast: Secondary | ICD-10-CM

## 2019-05-01 DIAGNOSIS — Z1231 Encounter for screening mammogram for malignant neoplasm of breast: Secondary | ICD-10-CM | POA: Diagnosis not present

## 2019-08-21 ENCOUNTER — Encounter: Payer: Self-pay | Admitting: Family Medicine

## 2019-08-21 ENCOUNTER — Other Ambulatory Visit: Payer: Self-pay

## 2019-08-21 ENCOUNTER — Ambulatory Visit (INDEPENDENT_AMBULATORY_CARE_PROVIDER_SITE_OTHER): Payer: PPO | Admitting: Family Medicine

## 2019-08-21 VITALS — BP 128/76 | HR 81 | Temp 98.0°F | Ht 64.0 in | Wt 203.4 lb

## 2019-08-21 DIAGNOSIS — K219 Gastro-esophageal reflux disease without esophagitis: Secondary | ICD-10-CM | POA: Diagnosis not present

## 2019-08-21 DIAGNOSIS — Z0000 Encounter for general adult medical examination without abnormal findings: Secondary | ICD-10-CM | POA: Diagnosis not present

## 2019-08-21 DIAGNOSIS — M8580 Other specified disorders of bone density and structure, unspecified site: Secondary | ICD-10-CM

## 2019-08-21 DIAGNOSIS — E039 Hypothyroidism, unspecified: Secondary | ICD-10-CM

## 2019-08-21 DIAGNOSIS — I10 Essential (primary) hypertension: Secondary | ICD-10-CM

## 2019-08-21 DIAGNOSIS — E669 Obesity, unspecified: Secondary | ICD-10-CM

## 2019-08-21 DIAGNOSIS — R7989 Other specified abnormal findings of blood chemistry: Secondary | ICD-10-CM

## 2019-08-21 DIAGNOSIS — M170 Bilateral primary osteoarthritis of knee: Secondary | ICD-10-CM

## 2019-08-21 DIAGNOSIS — E782 Mixed hyperlipidemia: Secondary | ICD-10-CM

## 2019-08-21 DIAGNOSIS — E559 Vitamin D deficiency, unspecified: Secondary | ICD-10-CM

## 2019-08-21 HISTORY — DX: Other specified disorders of bone density and structure, unspecified site: M85.80

## 2019-08-21 LAB — CBC WITH DIFFERENTIAL/PLATELET
Basophils Absolute: 0 10*3/uL (ref 0.0–0.1)
Basophils Relative: 0.8 % (ref 0.0–3.0)
Eosinophils Absolute: 0.2 10*3/uL (ref 0.0–0.7)
Eosinophils Relative: 3.4 % (ref 0.0–5.0)
HCT: 44.1 % (ref 36.0–46.0)
Hemoglobin: 14.7 g/dL (ref 12.0–15.0)
Lymphocytes Relative: 26.7 % (ref 12.0–46.0)
Lymphs Abs: 1.6 10*3/uL (ref 0.7–4.0)
MCHC: 33.3 g/dL (ref 30.0–36.0)
MCV: 94.2 fl (ref 78.0–100.0)
Monocytes Absolute: 0.7 10*3/uL (ref 0.1–1.0)
Monocytes Relative: 11 % (ref 3.0–12.0)
Neutro Abs: 3.5 10*3/uL (ref 1.4–7.7)
Neutrophils Relative %: 58.1 % (ref 43.0–77.0)
Platelets: 254 10*3/uL (ref 150.0–400.0)
RBC: 4.68 Mil/uL (ref 3.87–5.11)
RDW: 14.3 % (ref 11.5–15.5)
WBC: 6 10*3/uL (ref 4.0–10.5)

## 2019-08-21 LAB — COMPREHENSIVE METABOLIC PANEL
ALT: 36 U/L — ABNORMAL HIGH (ref 0–35)
AST: 34 U/L (ref 0–37)
Albumin: 4.4 g/dL (ref 3.5–5.2)
Alkaline Phosphatase: 87 U/L (ref 39–117)
BUN: 18 mg/dL (ref 6–23)
CO2: 31 mEq/L (ref 19–32)
Calcium: 10.2 mg/dL (ref 8.4–10.5)
Chloride: 100 mEq/L (ref 96–112)
Creatinine, Ser: 0.85 mg/dL (ref 0.40–1.20)
GFR: 66.13 mL/min (ref >60.00)
Glucose, Bld: 97 mg/dL (ref 70–99)
Potassium: 4.5 mEq/L (ref 3.5–5.1)
Sodium: 139 mEq/L (ref 135–145)
Total Bilirubin: 0.6 mg/dL (ref 0.2–1.2)
Total Protein: 7 g/dL (ref 6.0–8.3)

## 2019-08-21 LAB — GAMMA GT: GGT: 57 U/L — ABNORMAL HIGH (ref 7–51)

## 2019-08-21 LAB — LIPID PANEL
Cholesterol: 199 mg/dL (ref 0–200)
HDL: 43.7 mg/dL (ref >39.00)
NonHDL: 155.52
Total CHOL/HDL Ratio: 5
Triglycerides: 215 mg/dL — ABNORMAL HIGH (ref 0.0–149.0)
VLDL: 43 mg/dL — ABNORMAL HIGH (ref 0.0–40.0)

## 2019-08-21 LAB — LDL CHOLESTEROL, DIRECT: Direct LDL: 120 mg/dL

## 2019-08-21 LAB — T4, FREE: Free T4: 1.43 ng/dL (ref 0.60–1.60)

## 2019-08-21 LAB — TSH: TSH: 2.03 u[IU]/mL (ref 0.35–4.50)

## 2019-08-21 LAB — VITAMIN D 25 HYDROXY (VIT D DEFICIENCY, FRACTURES): VITD: 65.65 ng/mL (ref 30.00–100.00)

## 2019-08-21 MED ORDER — SYNTHROID 88 MCG PO TABS: ORAL_TABLET | ORAL | 3 refills | Status: DC

## 2019-08-21 MED ORDER — LIVALO 4 MG PO TABS: 4.0000 mg | ORAL_TABLET | Freq: Every day | ORAL | 3 refills | Status: DC

## 2019-08-21 NOTE — Progress Notes (Signed)
Subjective  Chief Complaint  Patient presents with  . Annual Exam    fasting  . Hypertension    No HA, dizziness, or visual changes. Does not check bp at home  . Hypothyroidism    Takes Synthroid 10mcg. no side effects  . Hyperlipidemia    Takes fish oil and livalo 4mg . Diet is improving    HPI: Kelly Barajas is a 69 y.o. female who presents to [facility] today for a Female Wellness Visit. She also has the concerns and/or needs as listed above in the chief complaint. These will be addressed in addition to the Health Maintenance Visit.   Wellness Visit: annual visit with health maintenance review and exam without Pap   HM:  CRC and mammo up to date. Doing well. Last dexa 2019 showed osteopenia. Retired and getting used to that (2 years now). No new concerns Chronic disease f/u and/or acute problem visit: (deemed necessary to be done in addition to the wellness visit):  Hypertension f/u: Control is good . Pt reports she is doing well. taking medications as instructed, no medication side effects noted, no TIAs, no chest pain on exertion, no dyspnea on exertion, no swelling of ankles. She denies adverse effects from his BP medications. Compliance with medication is good.   HLD on statin and tolerated it. Due for recheck. No AEs.   Low thyroid and also takes a biotin supplement. Denies sxs of low or high thyroid. Compliant with daily meds  Obesity with stable weight.   gerd is controlled on chronic ppi  Elevated LFTs: due for recheck. Ultrasound if persistent or worsening. Neg hep b and c. No other lab testing done yet. No sxs. No jaundice   BP Readings from Last 3 Encounters:  08/21/19 128/76  03/24/19 126/74  02/19/19 138/86   Wt Readings from Last 3 Encounters:  08/21/19 203 lb 6.4 oz (92.3 kg)  03/24/19 204 lb 9.6 oz (92.8 kg)  02/19/19 204 lb (92.5 kg)    Lab Results  Component Value Date   CHOL 186 08/14/2018   CHOL 210 (H)  08/15/2017   Lab Results  Component Value Date   HDL 39.20 08/14/2018   HDL 42.20 08/15/2017   Lab Results  Component Value Date   LDLCALC 110 (H) 08/14/2018   Lab Results  Component Value Date   TRIG 183.0 (H) 08/14/2018   TRIG 282.0 (H) 08/15/2017   Lab Results  Component Value Date   CHOLHDL 5 08/14/2018   CHOLHDL 5 08/15/2017   Lab Results  Component Value Date   LDLDIRECT 119.0 08/15/2017   Lab Results  Component Value Date   CREATININE 0.90 08/14/2018   BUN 13 08/14/2018   NA 139 08/14/2018   K 4.2 08/14/2018   CL 101 08/14/2018   CO2 26 08/14/2018    The 10-year ASCVD risk score Mikey Bussing DC Jr., et al., 2013) is: 12%   Values used to calculate the score:     Age: 61 years     Sex: Female     Is Non-Hispanic African American: No     Diabetic: No     Tobacco smoker: No     Systolic Blood Pressure: 664 mmHg     Is BP treated: Yes     HDL Cholesterol: 39.2 mg/dL     Total Cholesterol: 186 mg/dL  Lab Results  Component Value Date   TSH 3.13 08/14/2018     Assessment  1. Annual physical  exam   2. Essential hypertension   3. Mixed hyperlipidemia   4. Acquired hypothyroidism   5. Vitamin D deficiency   6. Bilateral primary osteoarthritis of knee   7. Obesity (BMI 30-39.9)   8. Gastroesophageal reflux disease without esophagitis   9. Elevated LFTs      Plan  Female Wellness Visit:  Age appropriate Health Maintenance and Prevention measures were discussed with patient. Included topics are cancer screening recommendations, ways to keep healthy (see AVS) including dietary and exercise recommendations, regular eye and dental care, use of seat belts, and avoidance of moderate alcohol use and tobacco use. mammo and dexa due in november  BMI: discussed patient's BMI and encouraged positive lifestyle modifications to help get to or maintain a target BMI.  HM needs and immunizations were addressed and ordered. See below for orders. See HM and immunization  section for updates. utd  Routine labs and screening tests ordered including cmp, cbc and lipids where appropriate.  Discussed recommendations regarding Vit D and calcium supplementation (see AVS)  Chronic disease management visit and/or acute problem visit:  HTN: well controlled. Check renal function and electrolytes.  HLD and low thyroid: due for recheck today on meds. Clinically stable.   gerd is controlled.   Discussed osteopenia and vit D / calcium supplements and weight bearing exercise.   Recheck liver tests and ultrasound if persistently elevated. Looking for hepatic steatosis. rec low fat diet.   OA is controlled with OTC meds.   Follow up: Return in about 6 months (around 02/21/2020) for follow up Hypertension.  Orders Placed This Encounter  Procedures  . CBC with Differential/Platelet  . Comprehensive metabolic panel  . Lipid panel  . TSH  . T4, free  . T3  . Gamma GT  . VITAMIN D 25 Hydroxy (Vit-D Deficiency, Fractures)   No orders of the defined types were placed in this encounter.     Lifestyle: Body mass index is 34.91 kg/m. Wt Readings from Last 3 Encounters:  08/21/19 203 lb 6.4 oz (92.3 kg)  03/24/19 204 lb 9.6 oz (92.8 kg)  02/19/19 204 lb (92.5 kg)     Patient Active Problem List   Diagnosis Date Noted  . Bilateral primary osteoarthritis of knee 02/19/2019  . Obesity (BMI 30-39.9) 09/05/2016  . Overactive bladder 03/07/2016  . Vitamin D deficiency 03/07/2016  . Hypothyroidism 06/01/2014  . Gastroesophageal reflux disease without esophagitis 11/27/2013  . Essential hypertension 11/27/2013    Negative Stress Test 2017: false positive EKG changes.   . Mixed hyperlipidemia 10/04/2012    Intolerant to crestor and lipitor: myalgias and mm cramps    Health Maintenance  Topic Date Due  . MAMMOGRAM  04/30/2020  . DEXA SCAN  04/12/2021  . TETANUS/TDAP  10/17/2021  . COLONOSCOPY  03/26/2025  . INFLUENZA VACCINE  Completed  . Hepatitis C  Screening  Completed  . PNA vac Low Risk Adult  Completed   Immunization History  Administered Date(s) Administered  . Fluad Quad(high Dose 65+) 02/19/2019  . Influenza, High Dose Seasonal PF 02/12/2018  . Pneumococcal Conjugate-13 09/05/2016  . Pneumococcal Polysaccharide-23 08/17/2015, 10/23/2017  . Tdap 10/18/2011  . Zoster Recombinat (Shingrix) 02/28/2018, 08/28/2018   We updated and reviewed the patient's past history in detail and it is documented below. Allergies: Patient has No Known Allergies. Past Medical History Patient  has a past medical history of Arthritis, Essential hypertension (11/27/2013), GERD (gastroesophageal reflux disease), Hyperlipidemia, and Hypothyroidism. Past Surgical History Patient  has a past  surgical history that includes Tonsillectomy; Dilation and curettage, diagnostic / therapeutic; and Knee arthroscopy w/ meniscal repair (Right, 07/16/2017). Family History: Patient family history includes Arthritis in her mother; CAD in her father and mother; Colon cancer (age of onset: 64) in her maternal grandmother; Depression in her mother; Diabetes in her mother; Diabetes Mellitus II in her mother; Heart attack in her father; Hyperlipidemia in her father and mother; Hypertension in her brother, mother, and sister; Hypothyroidism in her daughter and daughter. Social History:  Patient  reports that she has never smoked. She has never used smokeless tobacco. She reports that she does not drink alcohol or use drugs.  Review of Systems: Constitutional: negative for fever or malaise Ophthalmic: negative for photophobia, double vision or loss of vision Cardiovascular: negative for chest pain, dyspnea on exertion, or new LE swelling Respiratory: negative for SOB or persistent cough Gastrointestinal: negative for abdominal pain, change in bowel habits or melena Genitourinary: negative for dysuria or gross hematuria, no abnormal uterine bleeding or  disharge Musculoskeletal: negative for new gait disturbance or muscular weakness Integumentary: negative for new or persistent rashes, no breast lumps Neurological: negative for TIA or stroke symptoms Psychiatric: negative for SI or delusions Allergic/Immunologic: negative for hives  Patient Care Team    Relationship Specialty Notifications Start End  Willow Ora, MD PCP - General Family Medicine  02/12/18   Eldred Manges, MD Consulting Physician Orthopedic Surgery  08/15/17   Obgyn, [facility]    02/21/18   Jonelle Sidle, MD Consulting Physician Cardiology  02/21/18   Derryl Harbor, OD Consulting Physician Optometry  03/24/19     Objective  Vitals: BP 128/76 (BP Location: Right Arm, Patient Position: Sitting, Cuff Size: Normal)   Pulse 81   Temp 98 F (36.7 C) (Temporal)   Ht 5\' 4"  (1.626 m)   Wt 203 lb 6.4 oz (92.3 kg)   SpO2 92%   BMI 34.91 kg/m  General:  Well developed, well nourished, no acute distress  Psych:  Alert and orientedx3,normal mood and affect HEENT:  Normocephalic, atraumatic, non-icteric sclera, PERRL, oropharynx is clear without mass or exudate, supple neck without adenopathy, mass or thyromegaly Cardiovascular:  Normal S1, S2, RRR without gallop, rub or murmur Respiratory:  Good breath sounds bilaterally, CTAB with normal respiratory effort Gastrointestinal: normal bowel sounds, soft, non-tender, no noted masses. No HSM MSK: OA changes in DIPs bilateral hands w/o inflammation signs. No contusions. Joints are without erythema or swelling. Spine and CVA region are nontender Skin:  Warm, no rashes or suspicious lesions noted Neurologic:    Mental status is normal. CN 2-11 are normal. Gross motor and sensory exams are normal. Normal gait. No tremor Breast Exam: No mass, skin retraction or nipple discharge is appreciated in either breast. No axillary adenopathy. Fibrocystic changes are not noted     Commons side effects, risks, benefits, and alternatives for  medications and treatment plan prescribed today were discussed, and the patient expressed understanding of the given instructions. Patient is instructed to call or message via MyChart if he/she has any questions or concerns regarding our treatment plan. No barriers to understanding were identified. We discussed Red Flag symptoms and signs in detail. Patient expressed understanding regarding what to do in case of urgent or emergency type symptoms.   Medication list was reconciled, printed and provided to the patient in AVS. Patient instructions and summary information was reviewed with the patient as documented in the AVS. This note was prepared with assistance of  Dragon Chemical engineer. Occasional wrong-word or sound-a-like substitutions may have occurred due to the inherent limitations of voice recognition software  This visit occurred during the SARS-CoV-2 public health emergency.  Safety protocols were in place, including screening questions prior to the visit, additional usage of staff PPE, and extensive cleaning of exam room while observing appropriate contact time as indicated for disinfecting solutions.

## 2019-08-21 NOTE — Patient Instructions (Addendum)
Please return in 6 months for hypertension follow up.  I will release your lab results to you on your MyChart account with further instructions. Please reply with any questions.   I found your dexa report. Your next due is in November 2021. We will order that with your mammogram then.    Preventive Care 49 Years and Older, Female Preventive care refers to lifestyle choices and visits with your health care provider that can promote health and wellness. This includes:  A yearly physical exam. This is also called an annual well check.  Regular dental and eye exams.  Immunizations.  Screening for certain conditions.  Healthy lifestyle choices, such as diet and exercise. What can I expect for my preventive care visit? Physical exam Your health care provider will check:  Height and weight. These may be used to calculate body mass index (BMI), which is a measurement that tells if you are at a healthy weight.  Heart rate and blood pressure.  Your skin for abnormal spots. Counseling Your health care provider may ask you questions about:  Alcohol, tobacco, and drug use.  Emotional well-being.  Home and relationship well-being.  Sexual activity.  Eating habits.  History of falls.  Memory and ability to understand (cognition).  Work and work Statistician.  Pregnancy and menstrual history. What immunizations do I need?  Influenza (flu) vaccine  This is recommended every year. Tetanus, diphtheria, and pertussis (Tdap) vaccine  You may need a Td booster every 10 years. Varicella (chickenpox) vaccine  You may need this vaccine if you have not already been vaccinated. Zoster (shingles) vaccine  You may need this after age 3. Pneumococcal conjugate (PCV13) vaccine  One dose is recommended after age 16. Pneumococcal polysaccharide (PPSV23) vaccine  One dose is recommended after age 28. Measles, mumps, and rubella (MMR) vaccine  You may need at least one dose of MMR  if you were born in 1957 or later. You may also need a second dose. Meningococcal conjugate (MenACWY) vaccine  You may need this if you have certain conditions. Hepatitis A vaccine  You may need this if you have certain conditions or if you travel or work in places where you may be exposed to hepatitis A. Hepatitis B vaccine  You may need this if you have certain conditions or if you travel or work in places where you may be exposed to hepatitis B. Haemophilus influenzae type b (Hib) vaccine  You may need this if you have certain conditions. You may receive vaccines as individual doses or as more than one vaccine together in one shot (combination vaccines). Talk with your health care provider about the risks and benefits of combination vaccines. What tests do I need? Blood tests  Lipid and cholesterol levels. These may be checked every 5 years, or more frequently depending on your overall health.  Hepatitis C test.  Hepatitis B test. Screening  Lung cancer screening. You may have this screening every year starting at age 90 if you have a 30-pack-year history of smoking and currently smoke or have quit within the past 15 years.  Colorectal cancer screening. All adults should have this screening starting at age 37 and continuing until age 5. Your health care provider may recommend screening at age 60 if you are at increased risk. You will have tests every 1-10 years, depending on your results and the type of screening test.  Diabetes screening. This is done by checking your blood sugar (glucose) after you have not eaten for  a while (fasting). You may have this done every 1-3 years.  Mammogram. This may be done every 1-2 years. Talk with your health care provider about how often you should have regular mammograms.  BRCA-related cancer screening. This may be done if you have a family history of breast, ovarian, tubal, or peritoneal cancers. Other tests  Sexually transmitted disease  (STD) testing.  Bone density scan. This is done to screen for osteoporosis. You may have this done starting at age 40. Follow these instructions at home: Eating and drinking  Eat a diet that includes fresh fruits and vegetables, whole grains, lean protein, and low-fat dairy products. Limit your intake of foods with high amounts of sugar, saturated fats, and salt.  Take vitamin and mineral supplements as recommended by your health care provider.  Do not drink alcohol if your health care provider tells you not to drink.  If you drink alcohol: ? Limit how much you have to 0-1 drink a day. ? Be aware of how much alcohol is in your drink. In the [place] one drink equals one 12 oz bottle of beer (355 mL), one 5 oz glass of wine (148 mL), or one 1 oz glass of hard liquor (44 mL). Lifestyle  Take daily care of your teeth and gums.  Stay active. Exercise for at least 30 minutes on 5 or more days each week.  Do not use any products that contain nicotine or tobacco, such as cigarettes, e-cigarettes, and chewing tobacco. If you need help quitting, ask your health care provider.  If you are sexually active, practice safe sex. Use a condom or other form of protection in order to prevent STIs (sexually transmitted infections).  Talk with your health care provider about taking a low-dose aspirin or statin. What's next?  Go to your health care provider once a year for a well check visit.  Ask your health care provider how often you should have your eyes and teeth checked.  Stay up to date on all vaccines. This information is not intended to replace advice given to you by your health care provider. Make sure you discuss any questions you have with your health care provider. Document Revised: 05/23/2018 Document Reviewed: 05/23/2018 Elsevier Patient Education  2020 [employer].

## 2019-08-22 LAB — T3: T3, Total: 120 ng/dL (ref 76–181)

## 2019-09-22 ENCOUNTER — Other Ambulatory Visit: Payer: Self-pay

## 2019-09-22 ENCOUNTER — Telehealth: Payer: Self-pay | Admitting: Family Medicine

## 2019-09-22 MED ORDER — LISINOPRIL-HYDROCHLOROTHIAZIDE 10-12.5 MG PO TABS: 1.0000 | ORAL_TABLET | Freq: Every day | ORAL | 0 refills | Status: DC

## 2019-09-22 NOTE — Telephone Encounter (Signed)
Pt called stating [place]. Pt states she will not be able to get the medication for another 3 weeks. Pt is requesting Dr. Mardelle Matte write her a 3 week supply.   MEDICATION: Zestoretic 12.5 MG tablet  PHARMACY: [facility] [phone] [facility] [address]  Comments:  Please advise.   **Let patient know to contact pharmacy at the end of the day to make sure medication is ready. **  ** Please notify patient to allow 48-72 hours to process**  **Encourage patient to contact the pharmacy for refills or they can request refills through [facility]**

## 2019-09-22 NOTE — Telephone Encounter (Signed)
Script sent into pharmacy 

## 2020-01-07 ENCOUNTER — Telehealth: Payer: Self-pay | Admitting: Family Medicine

## 2020-01-07 NOTE — Progress Notes (Signed)
°  Chronic Care Management   Note  01/07/2020 Name: BRITAIN ANAGNOS MRN: [id number] DOB: December 09, 1950  MEAGAN ANCONA is a 69 y.o. year old female who is a primary care patient of Willow Ora, MD. I reached out to Hassell Halim by phone today in response to a referral sent by Ms. Wynelle Cleveland Martus's PCP, Willow Ora, MD.   Ms. Servidio was given information about Chronic Care Management services today including:  1. CCM service includes personalized support from designated clinical staff supervised by her physician, including individualized plan of care and coordination with other care providers 2. 24/7 contact phone numbers for assistance for urgent and routine care needs. 3. Service will only be billed when office clinical staff spend 20 minutes or more in a month to coordinate care. 4. Only one practitioner may furnish and bill the service in a calendar month. 5. The patient may stop CCM services at any time (effective at the end of the month) by phone call to the office staff.   Patient agreed to services and verbal consent obtained.   Follow up plan:   Lynnae January Upstream Scheduler

## 2020-02-09 ENCOUNTER — Ambulatory Visit: Payer: PPO

## 2020-02-09 ENCOUNTER — Telehealth: Payer: Self-pay | Admitting: Family Medicine

## 2020-02-09 DIAGNOSIS — K219 Gastro-esophageal reflux disease without esophagitis: Secondary | ICD-10-CM

## 2020-02-09 DIAGNOSIS — E782 Mixed hyperlipidemia: Secondary | ICD-10-CM

## 2020-02-09 DIAGNOSIS — I10 Essential (primary) hypertension: Secondary | ICD-10-CM

## 2020-02-09 NOTE — Patient Instructions (Addendum)
Please review care plan below and call me at [phone] (direct line) with any questions!  Thank you, Kelly Barajas., Clinical Pharmacist  Goals Addressed            This Visit's Progress   . PharmD Care Plan       CARE PLAN ENTRY (see longitudinal plan of care for additional care plan information)  Current Barriers:  . Chronic Disease Management support, education, and care coordination needs related to Hypertension, Hyperlipidemia, and GERD   Hypertension BP Readings from Last 3 Encounters:  08/21/19 128/76  03/24/19 126/74  02/19/19 138/86   . Pharmacist Clinical Goal(s): o Over the next 180 days, patient will work with PharmD and providers to maintain BP goal <130/80 . Current regimen:  o Lisinopril-hydrochlorothiazide 10-12.5 mg once daily . Interventions: o Reviewed diet recommendations  . Patient self care activities - Over the next 180 days, patient will: o Check BP once daily, document, and provide at future appointments o Ensure daily salt intake < 2300 mg/day  Hyperlipidemia Lab Results  Component Value Date/Time   LDLCALC 110 (H) 08/14/2018 08:33 AM   LDLDIRECT 120.0 08/21/2019 09:23 AM   . Pharmacist Clinical Goal(s): o Over the next 180 days, patient will work with PharmD and providers to achieve LDL goal < 100 . Current regimen:  o Livalo 4 mg once every night at bedtime . Interventions: o Reviewed diet/exercise recommendations . Patient self care activities - Over the next 180 days, patient will: o Continue current management GERD . Pharmacist Clinical Goal(s) o Over the next 180 days, patient will work with PharmD and providers to minimize symptoms of acid reflux . Current regimen:  o Omeprazole 20 mg once daily . Interventions: o Reviewed acid reflux triggers . Patient self care activities - Over the next 180 days, patient will: o Avoid reflux triggers  Medication management . Pharmacist Clinical Goal(s): o Over the next 180 days, patient will  work with PharmD and providers to maintain optimal medication adherence . Current pharmacy: [facility] . Interventions o Comprehensive medication review performed. o Continue current medication management strategy . Patient self care activities - Over the next 180 days, patient will: o Take medications as prescribed o Report any questions or concerns to PharmD and/or provider(s) Initial goal documentation.      Kelly Barajas was given information about Chronic Care Management services today including:  1. CCM service includes personalized support from designated clinical staff supervised by her physician, including individualized plan of care and coordination with other care providers 2. 24/7 contact phone numbers for assistance for urgent and routine care needs. 3. Standard insurance, coinsurance, copays and deductibles apply for chronic care management only during months in which we provide at least 20 minutes of these services. Most insurances cover these services at 100%, however patients may be responsible for any copay, coinsurance and/or deductible if applicable. This service may help you avoid the need for more expensive face-to-face services. 4. Only one practitioner may furnish and bill the service in a calendar month. 5. The patient may stop CCM services at any time (effective at the end of the month) by phone call to the office staff.  Patient agreed to services and verbal consent obtained.   The patient verbalized understanding of instructions provided today and agreed to receive a mailed copy of patient instruction and/or educational materials. Telephone follow up appointment with pharmacy team member scheduled for: See next appointment with "Care Management Staff" under "What's Next" below.   Kelly Barajas  Kelly Barajas, Pharm.D., BCGP Clinical Pharmacist [facility] [phone]  Hypertension, Adult High blood pressure (hypertension) is when the force of blood pumping through the  arteries is too strong. The arteries are the blood vessels that carry blood from the heart throughout the body. Hypertension forces the heart to work harder to pump blood and may cause arteries to become narrow or stiff. Untreated or uncontrolled hypertension can cause a heart attack, heart failure, a stroke, kidney disease, and other problems. A blood pressure reading consists of a higher number over a lower number. Ideally, your blood pressure should be below 120/80. The first ("top") number is called the systolic pressure. It is a measure of the pressure in your arteries as your heart beats. The second ("bottom") number is called the diastolic pressure. It is a measure of the pressure in your arteries as the heart relaxes. What are the causes? The exact cause of this condition is not known. There are some conditions that result in or are related to high blood pressure. What increases the risk? Some risk factors for high blood pressure are under your control. The following factors may make you more likely to develop this condition:  Smoking.  Having type 2 diabetes mellitus, high cholesterol, or both.  Not getting enough exercise or physical activity.  Being overweight.  Having too much fat, sugar, calories, or salt (sodium) in your diet.  Drinking too much alcohol. Some risk factors for high blood pressure may be difficult or impossible to change. Some of these factors include:  Having chronic kidney disease.  Having a family history of high blood pressure.  Age. Risk increases with age.  Race. You may be at higher risk if you are African American.  Gender. Men are at higher risk than women before age 70. After age 70, women are at higher risk than men.  Having obstructive sleep apnea.  Stress. What are the signs or symptoms? High blood pressure may not cause symptoms. Very high blood pressure (hypertensive crisis) may cause:  Headache.  Anxiety.  Shortness of  breath.  Nosebleed.  Nausea and vomiting.  Vision changes.  Severe chest pain.  Seizures. How is this diagnosed? This condition is diagnosed by measuring your blood pressure while you are seated, with your arm resting on a flat surface, your legs uncrossed, and your feet flat on the floor. The cuff of the blood pressure monitor will be placed directly against the skin of your upper arm at the level of your heart. It should be measured at least twice using the same arm. Certain conditions can cause a difference in blood pressure between your right and left arms. Certain factors can cause blood pressure readings to be lower or higher than normal for a short period of time:  When your blood pressure is higher when you are in a health care provider's office than when you are at home, this is called white coat hypertension. Most people with this condition do not need medicines.  When your blood pressure is higher at home than when you are in a health care provider's office, this is called masked hypertension. Most people with this condition may need medicines to control blood pressure. If you have a high blood pressure reading during one visit or you have normal blood pressure with other risk factors, you may be asked to:  Return on a different day to have your blood pressure checked again.  Monitor your blood pressure at home for 1 week or longer.  If you are diagnosed with hypertension, you may have other blood or imaging tests to help your health care provider understand your overall risk for other conditions. How is this treated? This condition is treated by making healthy lifestyle changes, such as eating healthy foods, exercising more, and reducing your alcohol intake. Your health care provider may prescribe medicine if lifestyle changes are not enough to get your blood pressure under control, and if:  Your systolic blood pressure is above 130.  Your diastolic blood pressure is above  80. Your personal target blood pressure may vary depending on your medical conditions, your age, and other factors. Follow these instructions at home: Eating and drinking   Eat a diet that is high in fiber and potassium, and low in sodium, added sugar, and fat. An example eating plan is called the DASH (Dietary Approaches to Stop Hypertension) diet. To eat this way: ? Eat plenty of fresh fruits and vegetables. Try to fill one half of your plate at each meal with fruits and vegetables. ? Eat whole grains, such as whole-wheat pasta, brown rice, or whole-grain bread. Fill about one fourth of your plate with whole grains. ? Eat or drink low-fat dairy products, such as skim milk or low-fat yogurt. ? Avoid fatty cuts of meat, processed or cured meats, and poultry with skin. Fill about one fourth of your plate with lean proteins, such as fish, chicken without skin, beans, eggs, or tofu. ? Avoid pre-made and processed foods. These tend to be higher in sodium, added sugar, and fat.  Reduce your daily sodium intake. Most people with hypertension should eat less than 1,500 mg of sodium a day.  Do not drink alcohol if: ? Your health care provider tells you not to drink. ? You are pregnant, may be pregnant, or are planning to become pregnant.  If you drink alcohol: ? Limit how much you use to:  0-1 drink a day for women.  0-2 drinks a day for men. ? Be aware of how much alcohol is in your drink. In the [place] one drink equals one 12 oz bottle of beer (355 mL), one 5 oz glass of wine (148 mL), or one 1 oz glass of hard liquor (44 mL). Lifestyle   Work with your health care provider to maintain a healthy body weight or to lose weight. Ask what an ideal weight is for you.  Get at least 30 minutes of exercise most days of the week. Activities may include walking, swimming, or biking.  Include exercise to strengthen your muscles (resistance exercise), such as Pilates or lifting weights, as part of  your weekly exercise routine. Try to do these types of exercises for 30 minutes at least 3 days a week.  Do not use any products that contain nicotine or tobacco, such as cigarettes, e-cigarettes, and chewing tobacco. If you need help quitting, ask your health care provider.  Monitor your blood pressure at home as told by your health care provider.  Keep all follow-up visits as told by your health care provider. This is important. Medicines  Take over-the-counter and prescription medicines only as told by your health care provider. Follow directions carefully. Blood pressure medicines must be taken as prescribed.  Do not skip doses of blood pressure medicine. Doing this puts you at risk for problems and can make the medicine less effective.  Ask your health care provider about side effects or reactions to medicines that you should watch for. Contact a health care provider if  you:  Think you are having a reaction to a medicine you are taking.  Have headaches that keep coming back (recurring).  Feel dizzy.  Have swelling in your ankles.  Have trouble with your vision. Get help right away if you:  Develop a severe headache or confusion.  Have unusual weakness or numbness.  Feel faint.  Have severe pain in your chest or abdomen.  Vomit repeatedly.  Have trouble breathing. Summary  Hypertension is when the force of blood pumping through your arteries is too strong. If this condition is not controlled, it may put you at risk for serious complications.  Your personal target blood pressure may vary depending on your medical conditions, your age, and other factors. For most people, a normal blood pressure is less than 120/80.  Hypertension is treated with lifestyle changes, medicines, or a combination of both. Lifestyle changes include losing weight, eating a healthy, low-sodium diet, exercising more, and limiting alcohol. This information is not intended to replace advice given  to you by your health care provider. Make sure you discuss any questions you have with your health care provider. Document Revised: 02/06/2018 Document Reviewed: 02/06/2018 Elsevier Patient Education  2020 [employer].

## 2020-02-09 NOTE — Telephone Encounter (Signed)
Patient called in wanting to know if she needs to fast for her upcoming appointment for a 6 month f/u on 9/22.

## 2020-02-09 NOTE — Progress Notes (Signed)
Chronic Care Management Pharmacy  Name: Kelly Barajas  MRN: [id number] DOB: 1951-04-28  Chief Complaint/ HPI  Kelly Barajas,  69 y.o., female presents for their Initial CCM visit with the clinical pharmacist via telephone due to COVID-19 Pandemic.  PCP : Leamon Arnt, MD  Chronic conditions include:  Encounter Diagnoses  Name Primary?  . Gastroesophageal reflux disease without esophagitis Yes  . Essential hypertension   . Mixed hyperlipidemia     Office Visits:  08/21/2019 (PCP): annual physical. tsh normal, lfts improved. F/u in 6 months.  Patient Active Problem List   Diagnosis Date Noted  . Osteopenia 08/21/2019  . Bilateral primary osteoarthritis of knee 02/19/2019  . Obesity (BMI 30-39.9) 09/05/2016  . Overactive bladder 03/07/2016  . Vitamin D deficiency 03/07/2016  . Hypothyroidism 06/01/2014  . Gastroesophageal reflux disease without esophagitis 11/27/2013  . Essential hypertension 11/27/2013  . Mixed hyperlipidemia 10/04/2012   Past Surgical History:  Procedure Laterality Date  . DILATION AND CURETTAGE, DIAGNOSTIC / THERAPEUTIC    . KNEE ARTHROSCOPY W/ MENISCAL REPAIR Right 07/16/2017  . TONSILLECTOMY     Family History  Problem Relation Age of Onset  . Hyperlipidemia Mother   . Diabetes Mellitus II Mother   . CAD Mother   . Hypertension Mother   . Depression Mother   . Diabetes Mother   . Arthritis Mother   . CAD Father   . Hyperlipidemia Father   . Heart attack Father   . Hypertension Sister   . Hypertension Brother   . Hypothyroidism Daughter   . Colon cancer Maternal Grandmother 72  . Hypothyroidism Daughter    No Known Allergies Outpatient Encounter Medications as of 02/09/2020  Medication Sig  . aspirin EC 81 MG tablet Take 81 mg by mouth at bedtime.  . Biotin w/ Vitamins C & E (HAIR SKIN & NAILS GUMMIES PO) Take by mouth.  . Coenzyme Q10 (COQ10) 100 MG CAPS Take by mouth.  . fish oil-omega-3 fatty acids 1000 MG capsule Take 1  g by mouth 4 (four) times daily.   Marland Kitchen lisinopril-hydrochlorothiazide (ZESTORETIC) 10-12.5 MG tablet Take 1 tablet by mouth daily.  Marland Kitchen LIVALO 4 MG TABS Take 1 tablet (4 mg total) by mouth at bedtime.  . magnesium gluconate (MAGONATE) 500 MG tablet Take 500 mg by mouth 2 (two) times daily.  . naproxen sodium (ANAPROX) 220 MG tablet Take 440 mg by mouth 2 (two) times daily. For pain  . omeprazole (PRILOSEC) 20 MG capsule Take 20 mg by mouth daily.  Marland Kitchen SYNTHROID 88 MCG tablet TAKE 1 TABLET BY MOUTH ONCE DAILY BEFORE BREAKFAST   No facility-administered encounter medications on file as of 02/09/2020.   Patient Care Team    Relationship Specialty Notifications Start End  Leamon Arnt, MD PCP - General Family Medicine  02/12/18   Marybelle Killings, MD Consulting Physician Orthopedic Surgery  08/15/17   Obgyn, Erling Conte    02/21/18   Satira Sark, MD Consulting Physician Cardiology  02/21/18   Melina Schools, [place] Physician Optometry  03/24/19   Madelin Rear, [facility] Pharmacist Pharmacist  01/07/20    Comment: [phone]   Current Diagnosis/Assessment: Goals Addressed            This Visit's Progress   . PharmD Care Plan       CARE PLAN ENTRY (see longitudinal plan of care for additional care plan information)  Current Barriers:  . Chronic Disease Management support, education, and care coordination needs  related to Hypertension, Hyperlipidemia, and GERD   Hypertension BP Readings from Last 3 Encounters:  08/21/19 128/76  03/24/19 126/74  02/19/19 138/86   . Pharmacist Clinical Goal(s): o Over the next 180 days, patient will work with PharmD and providers to maintain BP goal <130/80 . Current regimen:  o Lisinopril-hydrochlorothiazide 10-12.5 mg once daily . Interventions: o Reviewed diet recommendations  . Patient self care activities - Over the next 180 days, patient will: o Check BP once daily, document, and provide at future appointments o Ensure daily salt intake < 2300  mg/day  Hyperlipidemia Lab Results  Component Value Date/Time   LDLCALC 110 (H) 08/14/2018 08:33 AM   LDLDIRECT 120.0 08/21/2019 09:23 AM   . Pharmacist Clinical Goal(s): o Over the next 180 days, patient will work with PharmD and providers to achieve LDL goal < 100 . Current regimen:  o Livalo 4 mg once every night at bedtime . Interventions: o Reviewed diet/exercise recommendations . Patient self care activities - Over the next 180 days, patient will: o Continue current management GERD . Pharmacist Clinical Goal(s) o Over the next 180 days, patient will work with PharmD and providers to minimize symptoms of acid reflux . Current regimen:  o Omeprazole 20 mg once daily . Interventions: o Reviewed acid reflux triggers . Patient self care activities - Over the next 180 days, patient will: o Avoid reflux triggers  Medication management . Pharmacist Clinical Goal(s): o Over the next 180 days, patient will work with PharmD and providers to maintain optimal medication adherence . Current pharmacy: [facility] . Interventions o Comprehensive medication review performed. o Continue current medication management strategy . Patient self care activities - Over the next 180 days, patient will: o Take medications as prescribed o Report any questions or concerns to PharmD and/or provider(s) Initial goal documentation.      Hypertension   BP goal <130/80  BP Readings from Last 3 Encounters:  08/21/19 128/76  03/24/19 126/74  02/19/19 138/86   Patient checks BP at home infrequently. Patient home BP readings are ranging: n/a.  Patient is currently at goal on the following medications:  . Lisinopril-hctz 10-12.5 mg once daily   We discussed diet and exercise extensively. Water aerobics several times/week, has significantly reduced intake of salt, does not add salt to meals anymore. Plans to get back to the [facility].  Plan  Continue current medications and control with diet and  exercise.   Hyperlipidemia   LDL goal < 100  Lipid Panel     Component Value Date/Time   CHOL 199 08/21/2019 0923   TRIG 215.0 (H) 08/21/2019 0923   HDL 43.70 08/21/2019 0923   LDLCALC 110 (H) 08/14/2018 0833   LDLDIRECT 120.0 08/21/2019 0923    Hepatic Function Latest Ref Rng & Units 08/21/2019 09/25/2018 08/14/2018  Total Protein 6.0 - 8.3 g/dL 7.0 6.7 7.3  Albumin 3.5 - 5.2 g/dL 4.4 4.2 4.7  AST 0 - 37 U/L 34 46(H) 74(H)  ALT 0 - 35 U/L 36(H) 52(H) 65(H)  Alk Phosphatase 39 - 117 U/L 87 86 105  Total Bilirubin 0.2 - 1.2 mg/dL 0.6 0.7 0.6  Bilirubin, Direct 0.0 - 0.3 mg/dL - 0.1 -    The 10-year ASCVD risk score Mikey Bussing DC Jr., et al., 2013) is: 13.1%   Values used to calculate the score:     Age: 41 years     Sex: Female     Is Non-Hispanic African American: No     Diabetic: No  Tobacco smoker: No     Systolic Blood Pressure: 536 mmHg     Is BP treated: Yes     HDL Cholesterol: 43.7 mg/dL     Total Cholesterol: 199 mg/dL   Denies any side effects at this time.Patient is currently above goal on the following medications:  . Livalo 4 mg tablet once daily at bedtime.   We discussed:  diet and exercise extensively. See htn a/p.  Plan  Continue current medications and control with diet and exercise.  Hypothyroidism   Lab Results  Component Value Date/Time   TSH 2.03 08/21/2019 09:23 AM   TSH 3.13 08/14/2018 08:33 AM   FREET4 1.43 08/21/2019 09:23 AM   Controlled on:  Synthroid 88 mcg once daily before breakfast  We discussed:  Proper use of levothyroxine.  Plan  Continue current medications.  GERD   Patient denies recent acid reflux. Currently controlled on: . Omeprazole 20 mg once daily   We discussed: Avoidance of potential triggers such as spicy, fatty foods, lying down after eating and tomato sauce.   Plan   Continue current medications and control with diet.  Osteopenia   Last DEXA Scan: 02/2019. T-Score of -2.1, considered osteopenic.  AP  Spine L1-L4: -2.1  Dual Femur Neck Left: -1.9   VITD  Date Value Ref Range Status  08/21/2019 65.65 30.00 - 100.00 ng/mL Final    Patient is not a candidate for pharmacologic treatment.  We discussed:  Recommend [phone] units of vitamin D daily. Recommend 1200 mg of calcium daily from dietary and supplemental sources. Recommend weight-bearing and muscle strengthening exercises for building and maintaining bone density.  Plan  Continue control with diet and exercise.  Vaccines   Immunization History  Administered Date(s) Administered  . Fluad Quad(high Dose 65+) 02/19/2019  . Influenza, High Dose Seasonal PF 02/12/2018  . Pneumococcal Conjugate-13 09/05/2016  . Pneumococcal Polysaccharide-23 08/17/2015, 10/23/2017  . Tdap 10/18/2011  . Zoster Recombinat (Shingrix) 02/28/2018, 08/28/2018   Reviewed and discussed patient's vaccination history.  Received covid J&J vaccine 10/22/2019 [facility]   Plan  No recommendations at this time.   Medication Management / Care Coordination   Receives prescription medications from:  [place], [place] - [address] [place] [address] Phone: [phone] Fax: [phone]   Denies any issues with current medication management.  Coordinated scheduling for 6 month f/u appointment with PCP.  Plan  Continue current medication management strategy. ___________________________ SDOH (Social Determinants of Health) assessments performed: Yes.  Future Appointments  Date Time Provider [place]  03/03/2020  8:30 AM Leamon Arnt, MD [facility]  08/11/2020  1:00 PM [facility] CCM PHARMACIST [facility]   Visit follow-up:  . [facility] follow-up: 6 month telephone visit.  Madelin Rear, Pharm.D., BCGP Clinical Pharmacist [facility] [phone]

## 2020-02-10 ENCOUNTER — Other Ambulatory Visit: Payer: Self-pay

## 2020-02-10 DIAGNOSIS — I10 Essential (primary) hypertension: Secondary | ICD-10-CM

## 2020-02-10 DIAGNOSIS — E782 Mixed hyperlipidemia: Secondary | ICD-10-CM

## 2020-02-10 DIAGNOSIS — E039 Hypothyroidism, unspecified: Secondary | ICD-10-CM

## 2020-02-10 NOTE — Telephone Encounter (Signed)
No she can eat

## 2020-02-10 NOTE — Telephone Encounter (Signed)
See below

## 2020-02-10 NOTE — Telephone Encounter (Signed)
Patient notified

## 2020-03-03 ENCOUNTER — Ambulatory Visit (INDEPENDENT_AMBULATORY_CARE_PROVIDER_SITE_OTHER): Payer: PPO | Admitting: Family Medicine

## 2020-03-03 ENCOUNTER — Other Ambulatory Visit: Payer: Self-pay

## 2020-03-03 ENCOUNTER — Encounter: Payer: Self-pay | Admitting: Family Medicine

## 2020-03-03 VITALS — BP 134/82 | HR 85 | Temp 98.0°F | Ht 64.0 in | Wt 190.8 lb

## 2020-03-03 DIAGNOSIS — E669 Obesity, unspecified: Secondary | ICD-10-CM

## 2020-03-03 DIAGNOSIS — M170 Bilateral primary osteoarthritis of knee: Secondary | ICD-10-CM | POA: Diagnosis not present

## 2020-03-03 DIAGNOSIS — Z23 Encounter for immunization: Secondary | ICD-10-CM

## 2020-03-03 DIAGNOSIS — K219 Gastro-esophageal reflux disease without esophagitis: Secondary | ICD-10-CM

## 2020-03-03 DIAGNOSIS — I10 Essential (primary) hypertension: Secondary | ICD-10-CM

## 2020-03-03 DIAGNOSIS — E782 Mixed hyperlipidemia: Secondary | ICD-10-CM | POA: Diagnosis not present

## 2020-03-03 NOTE — Patient Instructions (Addendum)
Please return in March 2022 for your annual complete physical; please come fasting.  Please check your blood pressures and send me results in about 2-4 weeks. And I'm very happy to hear about your healthier eating and weight loss. Keep it up!  Today you were given your flu vaccination.   If you have any questions or concerns, please don't hesitate to send me a message via MyChart or call the office at [phone]. Thank you for visiting with [place] today! It's our pleasure caring for you.   Hypertension, Adult High blood pressure (hypertension) is when the force of blood pumping through the arteries is too strong. The arteries are the blood vessels that carry blood from the heart throughout the body. Hypertension forces the heart to work harder to pump blood and may cause arteries to become narrow or stiff. Untreated or uncontrolled hypertension can cause a heart attack, heart failure, a stroke, kidney disease, and other problems. A blood pressure reading consists of a higher number over a lower number. Ideally, your blood pressure should be below 120/80. The first ("top") number is called the systolic pressure. It is a measure of the pressure in your arteries as your heart beats. The second ("bottom") number is called the diastolic pressure. It is a measure of the pressure in your arteries as the heart relaxes. What are the causes? The exact cause of this condition is not known. There are some conditions that result in or are related to high blood pressure. What increases the risk? Some risk factors for high blood pressure are under your control. The following factors may make you more likely to develop this condition:  Smoking.  Having type 2 diabetes mellitus, high cholesterol, or both.  Not getting enough exercise or physical activity.  Being overweight.  Having too much fat, sugar, calories, or salt (sodium) in your diet.  Drinking too much alcohol. Some risk factors for high blood  pressure may be difficult or impossible to change. Some of these factors include:  Having chronic kidney disease.  Having a family history of high blood pressure.  Age. Risk increases with age.  Race. You may be at higher risk if you are African American.  Gender. Men are at higher risk than women before age 3. After age 106, women are at higher risk than men.  Having obstructive sleep apnea.  Stress. What are the signs or symptoms? High blood pressure may not cause symptoms. Very high blood pressure (hypertensive crisis) may cause:  Headache.  Anxiety.  Shortness of breath.  Nosebleed.  Nausea and vomiting.  Vision changes.  Severe chest pain.  Seizures. How is this diagnosed? This condition is diagnosed by measuring your blood pressure while you are seated, with your arm resting on a flat surface, your legs uncrossed, and your feet flat on the floor. The cuff of the blood pressure monitor will be placed directly against the skin of your upper arm at the level of your heart. It should be measured at least twice using the same arm. Certain conditions can cause a difference in blood pressure between your right and left arms. Certain factors can cause blood pressure readings to be lower or higher than normal for a short period of time:  When your blood pressure is higher when you are in a health care provider's office than when you are at home, this is called white coat hypertension. Most people with this condition do not need medicines.  When your blood pressure is higher at  home than when you are in a health care provider's office, this is called masked hypertension. Most people with this condition may need medicines to control blood pressure. If you have a high blood pressure reading during one visit or you have normal blood pressure with other risk factors, you may be asked to:  Return on a different day to have your blood pressure checked again.  Monitor your blood  pressure at home for 1 week or longer. If you are diagnosed with hypertension, you may have other blood or imaging tests to help your health care provider understand your overall risk for other conditions. How is this treated? This condition is treated by making healthy lifestyle changes, such as eating healthy foods, exercising more, and reducing your alcohol intake. Your health care provider may prescribe medicine if lifestyle changes are not enough to get your blood pressure under control, and if:  Your systolic blood pressure is above 130.  Your diastolic blood pressure is above 80. Your personal target blood pressure may vary depending on your medical conditions, your age, and other factors. Follow these instructions at home: Eating and drinking   Eat a diet that is high in fiber and potassium, and low in sodium, added sugar, and fat. An example eating plan is called the DASH (Dietary Approaches to Stop Hypertension) diet. To eat this way: ? Eat plenty of fresh fruits and vegetables. Try to fill one half of your plate at each meal with fruits and vegetables. ? Eat whole grains, such as whole-wheat pasta, brown rice, or whole-grain bread. Fill about one fourth of your plate with whole grains. ? Eat or drink low-fat dairy products, such as skim milk or low-fat yogurt. ? Avoid fatty cuts of meat, processed or cured meats, and poultry with skin. Fill about one fourth of your plate with lean proteins, such as fish, chicken without skin, beans, eggs, or tofu. ? Avoid pre-made and processed foods. These tend to be higher in sodium, added sugar, and fat.  Reduce your daily sodium intake. Most people with hypertension should eat less than 1,500 mg of sodium a day.  Do not drink alcohol if: ? Your health care provider tells you not to drink. ? You are pregnant, may be pregnant, or are planning to become pregnant.  If you drink alcohol: ? Limit how much you use to:  0-1 drink a day for  women.  0-2 drinks a day for men. ? Be aware of how much alcohol is in your drink. In the [place] one drink equals one 12 oz bottle of beer (355 mL), one 5 oz glass of wine (148 mL), or one 1 oz glass of hard liquor (44 mL). Lifestyle   Work with your health care provider to maintain a healthy body weight or to lose weight. Ask what an ideal weight is for you.  Get at least 30 minutes of exercise most days of the week. Activities may include walking, swimming, or biking.  Include exercise to strengthen your muscles (resistance exercise), such as Pilates or lifting weights, as part of your weekly exercise routine. Try to do these types of exercises for 30 minutes at least 3 days a week.  Do not use any products that contain nicotine or tobacco, such as cigarettes, e-cigarettes, and chewing tobacco. If you need help quitting, ask your health care provider.  Monitor your blood pressure at home as told by your health care provider.  Keep all follow-up visits as told by your health  care provider. This is important. Medicines  Take over-the-counter and prescription medicines only as told by your health care provider. Follow directions carefully. Blood pressure medicines must be taken as prescribed.  Do not skip doses of blood pressure medicine. Doing this puts you at risk for problems and can make the medicine less effective.  Ask your health care provider about side effects or reactions to medicines that you should watch for. Contact a health care provider if you:  Think you are having a reaction to a medicine you are taking.  Have headaches that keep coming back (recurring).  Feel dizzy.  Have swelling in your ankles.  Have trouble with your vision. Get help right away if you:  Develop a severe headache or confusion.  Have unusual weakness or numbness.  Feel faint.  Have severe pain in your chest or abdomen.  Vomit repeatedly.  Have trouble  breathing. Summary  Hypertension is when the force of blood pumping through your arteries is too strong. If this condition is not controlled, it may put you at risk for serious complications.  Your personal target blood pressure may vary depending on your medical conditions, your age, and other factors. For most people, a normal blood pressure is less than 120/80.  Hypertension is treated with lifestyle changes, medicines, or a combination of both. Lifestyle changes include losing weight, eating a healthy, low-sodium diet, exercising more, and limiting alcohol. This information is not intended to replace advice given to you by your health care provider. Make sure you discuss any questions you have with your health care provider. Document Revised: 02/06/2018 Document Reviewed: 02/06/2018 Elsevier Patient Education  2020 [employer].

## 2020-03-03 NOTE — Progress Notes (Signed)
Subjective  CC:  Chief Complaint  Patient presents with   Hypertension   Gastroesophageal Reflux   Hyperlipidemia   Obesity    HPI: Kelly Barajas is a 69 y.o. female who presents to the office today to address the problems listed above in the chief complaint.  Hypertension f/u: Control has been good. Pt reports she is doing well. taking medications as instructed, no medication side effects noted, no TIAs, no chest pain on exertion, no dyspnea on exertion, no swelling of ankles.  She does not take her blood pressure outside of the office. denies adverse effects from his BP medications. Compliance with medication is good.  I reviewed lab work from March including normal renal function, potassium and cholesterol panel.  I also reviewed recent chronic care management notes recommendations.  Hyperlipidemia and obesity follow-up: She is tolerating her statin.  No adverse effects.  She has been eating healthier.  She is cooking at home more often.  Eating healthier food choices.  She has lost weight as well.  She is no longer eating fast food.  She is happy with her lifestyle changes.  Knee arthritis: She has been using Naprosyn frequently.  She has decreased use significant amount is doing well.  Now using only on occasion.  GERD: Well controlled on PPI.  Assessment  1. Essential hypertension   2. Mixed hyperlipidemia   3. Obesity (BMI 30-39.9)   4. Bilateral primary osteoarthritis of knee   5. Gastroesophageal reflux disease without esophagitis      Plan    Hypertension f/u: BP control is fairly well controlled.  Blood pressures are mildly elevated here in the office today.  This is unusual for her.  Patient to start checking home blood pressures and send me readings.  We will continue her current dose of medications.  Continue low-salt diet.  Continue weight loss.  Hyperlipidemia f/u: Doing better with diet.  Weight is down.  Will recheck fasting lipid panel in March with  her complete physical.  Continue statin.  Expect improvement in LDL panel.  Obesity: Significantly improving.  Praised.  Continue healthy diet.  Knee arthritis: Now intermittently bothersome.  Naprosyn as needed.  GERD: Continue PPI Flu shot today Education regarding management of these chronic disease states was given. Management strategies discussed on successive visits include dietary and exercise recommendations, goals of achieving and maintaining IBW, and lifestyle modifications aiming for adequate sleep and minimizing stressors.   Follow up: Return in about 6 months (around 08/31/2020) for complete physical, follow up Hypertension, follow up hypercholesterolemia, hypothyroidism follow up.  No orders of the defined types were placed in this encounter.  No orders of the defined types were placed in this encounter.     BP Readings from Last 3 Encounters:  03/03/20 134/82  08/21/19 128/76  03/24/19 126/74   Wt Readings from Last 3 Encounters:  03/03/20 190 lb 12.8 oz (86.5 kg)  08/21/19 203 lb 6.4 oz (92.3 kg)  03/24/19 204 lb 9.6 oz (92.8 kg)    Lab Results  Component Value Date   CHOL 199 08/21/2019   CHOL 186 08/14/2018   CHOL 210 (H) 08/15/2017   Lab Results  Component Value Date   HDL 43.70 08/21/2019   HDL 39.20 08/14/2018   HDL 42.20 08/15/2017   Lab Results  Component Value Date   LDLCALC 110 (H) 08/14/2018   Lab Results  Component Value Date   TRIG 215.0 (H) 08/21/2019   TRIG 183.0 (H) 08/14/2018  TRIG 282.0 (H) 08/15/2017   Lab Results  Component Value Date   CHOLHDL 5 08/21/2019   CHOLHDL 5 08/14/2018   CHOLHDL 5 08/15/2017   Lab Results  Component Value Date   LDLDIRECT 120.0 08/21/2019   LDLDIRECT 119.0 08/15/2017   Lab Results  Component Value Date   CREATININE 0.85 08/21/2019   BUN 18 08/21/2019   NA 139 08/21/2019   K 4.5 08/21/2019   CL 100 08/21/2019   CO2 31 08/21/2019    The 10-year ASCVD risk score Denman George DC Jr., et al.,  2013) is: 14.3%   Values used to calculate the score:     Age: 6 years     Sex: Female     Is Non-Hispanic African American: No     Diabetic: No     Tobacco smoker: No     Systolic Blood Pressure: 134 mmHg     Is BP treated: Yes     HDL Cholesterol: 43.7 mg/dL     Total Cholesterol: 199 mg/dL  I reviewed the patients updated PMH, FH, and SocHx.    Patient Active Problem List   Diagnosis Date Noted   Hypothyroidism 06/01/2014    Priority: High   Essential hypertension 11/27/2013    Priority: High   Mixed hyperlipidemia 10/04/2012    Priority: High   Osteopenia 08/21/2019    Priority: Medium   Gastroesophageal reflux disease without esophagitis 11/27/2013    Priority: Medium   Bilateral primary osteoarthritis of knee 02/19/2019    Priority: Low   Obesity (BMI 30-39.9) 09/05/2016    Priority: Low   Overactive bladder 03/07/2016    Priority: Low   Vitamin D deficiency 03/07/2016    Priority: Low    Allergies: Patient has no known allergies.  Social History: Patient  reports that she has never smoked. She has never used smokeless tobacco. She reports that she does not drink alcohol and does not use drugs.  Current Meds  Medication Sig   aspirin EC 81 MG tablet Take 81 mg by mouth at bedtime.   Biotin w/ Vitamins C & E (HAIR SKIN & NAILS GUMMIES PO) Take by mouth.   Coenzyme Q10 (COQ10) 100 MG CAPS Take by mouth.   fish oil-omega-3 fatty acids 1000 MG capsule Take 1 g by mouth 4 (four) times daily.    lisinopril-hydrochlorothiazide (ZESTORETIC) 10-12.5 MG tablet Take 1 tablet by mouth daily.   LIVALO 4 MG TABS Take 1 tablet (4 mg total) by mouth at bedtime.   magnesium gluconate (MAGONATE) 500 MG tablet Take 500 mg by mouth 2 (two) times daily.   naproxen sodium (ANAPROX) 220 MG tablet Take 440 mg by mouth daily as needed. For pain   omeprazole (PRILOSEC) 20 MG capsule Take 20 mg by mouth daily.   SYNTHROID 88 MCG tablet TAKE 1 TABLET BY MOUTH ONCE  DAILY BEFORE BREAKFAST    Review of Systems: Cardiovascular: negative for chest pain, palpitations, leg swelling, orthopnea Respiratory: negative for SOB, wheezing or persistent cough Gastrointestinal: negative for abdominal pain Genitourinary: negative for dysuria or gross hematuria  Objective  Vitals: BP 134/82 Comment: repeated by me, large cuff   Pulse 85    Temp 98 F (36.7 C) (Temporal)    Ht 5\' 4"  (1.626 m)    Wt 190 lb 12.8 oz (86.5 kg)    SpO2 96%    BMI 32.75 kg/m  General: no acute distress  Psych:  Alert and oriented, normal mood and affect HEENT:  Normocephalic,  atraumatic, supple neck  Cardiovascular:  RRR without murmur. no edema Respiratory:  Good breath sounds bilaterally, CTAB with normal respiratory effort Neurologic:   Mental status is normal  Commons side effects, risks, benefits, and alternatives for medications and treatment plan prescribed today were discussed, and the patient expressed understanding of the given instructions. Patient is instructed to call or message via MyChart if he/she has any questions or concerns regarding our treatment plan. No barriers to understanding were identified. We discussed Red Flag symptoms and signs in detail. Patient expressed understanding regarding what to do in case of urgent or emergency type symptoms.   Medication list was reconciled, printed and provided to the patient in AVS. Patient instructions and summary information was reviewed with the patient as documented in the AVS. This note was prepared with assistance of Dragon voice recognition software. Occasional wrong-word or sound-a-like substitutions may have occurred due to the inherent limitations of voice recognition software  This visit occurred during the SARS-CoV-2 public health emergency.  Safety protocols were in place, including screening questions prior to the visit, additional usage of staff PPE, and extensive cleaning of exam room while observing appropriate  contact time as indicated for disinfecting solutions.

## 2020-03-21 ENCOUNTER — Other Ambulatory Visit: Payer: Self-pay | Admitting: Family Medicine

## 2020-03-22 ENCOUNTER — Other Ambulatory Visit: Payer: Self-pay | Admitting: Family Medicine

## 2020-03-22 DIAGNOSIS — Z1231 Encounter for screening mammogram for malignant neoplasm of breast: Secondary | ICD-10-CM

## 2020-03-22 NOTE — Telephone Encounter (Signed)
Year script sent in yesterday

## 2020-04-07 ENCOUNTER — Telehealth: Payer: Self-pay | Admitting: Family Medicine

## 2020-04-07 NOTE — Telephone Encounter (Signed)
Left message for patient to call back and schedule Medicare Annual Wellness Visit (AWV) either virtually/audio only OR in office. Whatever the patients preference is.  Last AWV 03/24/2019; please schedule at anytime with [facility]Nurse Health Advisor at [facility].  This should be a 45 minute visit.

## 2020-05-03 ENCOUNTER — Other Ambulatory Visit: Payer: Self-pay | Admitting: Family Medicine

## 2020-05-03 ENCOUNTER — Ambulatory Visit
Admission: RE | Admit: 2020-05-03 | Discharge: 2020-05-03 | Disposition: A | Discharge status: Home / Self Care | Payer: PPO | Source: Ambulatory Visit | Attending: Family Medicine | Admitting: Family Medicine

## 2020-05-03 ENCOUNTER — Other Ambulatory Visit: Payer: Self-pay

## 2020-05-03 DIAGNOSIS — Z1231 Encounter for screening mammogram for malignant neoplasm of breast: Secondary | ICD-10-CM

## 2020-08-11 ENCOUNTER — Ambulatory Visit: Payer: PPO

## 2020-08-12 ENCOUNTER — Telehealth: Payer: Self-pay

## 2020-08-12 NOTE — Chronic Care Management (AMB) (Signed)
Chronic Care Management Pharmacy Assistant   Name: Kelly Barajas  MRN: [id number] DOB: 1951/05/28  Reason for Encounter: Disease State/ Hypertension Adherence Call  PCP : Willow Ora, MD  Allergies:  No Known Allergies  Medications: Outpatient Encounter Medications as of 08/12/2020  Medication Sig   aspirin EC 81 MG tablet Take 81 mg by mouth at bedtime.   Biotin w/ Vitamins C & E (HAIR SKIN & NAILS GUMMIES PO) Take by mouth.   Coenzyme Q10 (COQ10) 100 MG CAPS Take by mouth.   fish oil-omega-3 fatty acids 1000 MG capsule Take 1 g by mouth 4 (four) times daily.    lisinopril-hydrochlorothiazide (ZESTORETIC) 10-12.5 MG tablet Take 1 tablet by mouth once daily   LIVALO 4 MG TABS Take 1 tablet (4 mg total) by mouth at bedtime.   magnesium gluconate (MAGONATE) 500 MG tablet Take 500 mg by mouth 2 (two) times daily.   naproxen sodium (ANAPROX) 220 MG tablet Take 440 mg by mouth daily as needed. For pain   omeprazole (PRILOSEC) 20 MG capsule Take 20 mg by mouth daily.   SYNTHROID 88 MCG tablet TAKE 1 TABLET BY MOUTH ONCE DAILY BEFORE BREAKFAST   No facility-administered encounter medications on file as of 08/12/2020.    Current Diagnosis: Patient Active Problem List   Diagnosis Date Noted   Osteopenia 08/21/2019   Bilateral primary osteoarthritis of knee 02/19/2019   Obesity (BMI 30-39.9) 09/05/2016   Overactive bladder 03/07/2016   Vitamin D deficiency 03/07/2016   Hypothyroidism 06/01/2014   Gastroesophageal reflux disease without esophagitis 11/27/2013   Essential hypertension 11/27/2013   Mixed hyperlipidemia 10/04/2012   Reviewed chart prior to disease state call. Spoke with patient regarding BP  Recent Office Vitals: BP Readings from Last 3 Encounters:  03/03/20 134/82  08/21/19 128/76  03/24/19 126/74   Pulse Readings from Last 3 Encounters:  03/03/20 85  08/21/19 81  02/19/19 100    Wt Readings from Last 3 Encounters:  03/03/20  190 lb 12.8 oz (86.5 kg)  08/21/19 203 lb 6.4 oz (92.3 kg)  03/24/19 204 lb 9.6 oz (92.8 kg)     Kidney Function Lab Results  Component Value Date/Time   CREATININE 0.85 08/21/2019 09:23 AM   CREATININE 0.90 08/14/2018 08:33 AM   GFR 66.13 08/21/2019 09:23 AM   GFRNONAA 70 (L) 09/22/2011 05:30 PM   GFRAA 81 (L) 09/22/2011 05:30 PM    BMP Latest Ref Rng & Units 08/21/2019 08/14/2018 08/15/2017  Glucose 70 - 99 mg/dL 97 99 96  BUN 6 - 23 mg/dL 18 13 16   Creatinine 0.40 - 1.20 mg/dL 3.81 8.29  Sodium 135 - 145 mEq/L 139 139 139  Potassium 3.5 - 5.1 mEq/L 4.5 4.2 4.5  Chloride 96 - 112 mEq/L 100 101 103  CO2 19 - 32 mEq/L 31 26 27   Calcium 8.4 - 10.5 mg/dL 9.37 16.9     Current antihypertensive regimen:  o Lisinopril HCTZ 10-12.5 mg daily   How often are you checking your Blood Pressure?     Current home BP readings:    What recent interventions/DTPs have been made by any provider to improve Blood Pressure control since last CPP Visit:    Any recent hospitalizations or ED visits since last visit with CPP?    What diet changes have been made to improve Blood Pressure Control?  o    What exercise is being done to improve your Blood Pressure Control?  o   Adherence  Review: Is the patient currently on ACE/ARB medication? Yes Does the patient have >5 day gap between last estimated fill dates? No   Star Rating Drugs: Lisinopril/HCTZ 10-12.5 mg last filled 06/16/2020   **Several unsuccessful attempts were made to contact the patient for a hypertension adherence call. I left several messages for the patient to return my call.**  April Amie Critchley, [facility] Clinical Pharmacist Assistant [phone]   Follow-Up:  Pharmacist Review

## 2020-09-01 ENCOUNTER — Other Ambulatory Visit: Payer: Self-pay

## 2020-09-01 ENCOUNTER — Ambulatory Visit (INDEPENDENT_AMBULATORY_CARE_PROVIDER_SITE_OTHER): Payer: PPO | Admitting: Family Medicine

## 2020-09-01 ENCOUNTER — Encounter: Payer: Self-pay | Admitting: Family Medicine

## 2020-09-01 VITALS — BP 130/82 | HR 76 | Temp 98.2°F | Resp 16 | Ht 64.0 in | Wt 187.0 lb

## 2020-09-01 DIAGNOSIS — K219 Gastro-esophageal reflux disease without esophagitis: Secondary | ICD-10-CM

## 2020-09-01 DIAGNOSIS — E039 Hypothyroidism, unspecified: Secondary | ICD-10-CM | POA: Diagnosis not present

## 2020-09-01 DIAGNOSIS — E782 Mixed hyperlipidemia: Secondary | ICD-10-CM

## 2020-09-01 DIAGNOSIS — Z0000 Encounter for general adult medical examination without abnormal findings: Secondary | ICD-10-CM

## 2020-09-01 DIAGNOSIS — I10 Essential (primary) hypertension: Secondary | ICD-10-CM

## 2020-09-01 DIAGNOSIS — R7989 Other specified abnormal findings of blood chemistry: Secondary | ICD-10-CM | POA: Diagnosis not present

## 2020-09-01 DIAGNOSIS — M8580 Other specified disorders of bone density and structure, unspecified site: Secondary | ICD-10-CM | POA: Diagnosis not present

## 2020-09-01 LAB — CBC WITH DIFFERENTIAL/PLATELET
Basophils Absolute: 0.1 10*3/uL (ref 0.0–0.1)
Basophils Relative: 0.9 % (ref 0.0–3.0)
Eosinophils Absolute: 0.2 10*3/uL (ref 0.0–0.7)
Eosinophils Relative: 2.8 % (ref 0.0–5.0)
HCT: 43.1 % (ref 36.0–46.0)
Hemoglobin: 14.8 g/dL (ref 12.0–15.0)
Lymphocytes Relative: 25.7 % (ref 12.0–46.0)
Lymphs Abs: 1.5 10*3/uL (ref 0.7–4.0)
MCHC: 34.3 g/dL (ref 30.0–36.0)
MCV: 92.2 fl (ref 78.0–100.0)
Monocytes Absolute: 0.7 10*3/uL (ref 0.1–1.0)
Monocytes Relative: 11.3 % (ref 3.0–12.0)
Neutro Abs: 3.5 10*3/uL (ref 1.4–7.7)
Neutrophils Relative %: 59.3 % (ref 43.0–77.0)
Platelets: 256 10*3/uL (ref 150.0–400.0)
RBC: 4.68 Mil/uL (ref 3.87–5.11)
RDW: 14 % (ref 11.5–15.5)
WBC: 5.9 10*3/uL (ref 4.0–10.5)

## 2020-09-01 LAB — COMPREHENSIVE METABOLIC PANEL
ALT: 23 U/L (ref 0–35)
AST: 24 U/L (ref 0–37)
Albumin: 4.6 g/dL (ref 3.5–5.2)
Alkaline Phosphatase: 76 U/L (ref 39–117)
BUN: 19 mg/dL (ref 6–23)
CO2: 28 mEq/L (ref 19–32)
Calcium: 10 mg/dL (ref 8.4–10.5)
Chloride: 99 mEq/L (ref 96–112)
Creatinine, Ser: 0.81 mg/dL (ref 0.40–1.20)
GFR: 73.26 mL/min (ref >60.00)
Glucose, Bld: 99 mg/dL (ref 70–99)
Potassium: 3.9 mEq/L (ref 3.5–5.1)
Sodium: 137 mEq/L (ref 135–145)
Total Bilirubin: 0.8 mg/dL (ref 0.2–1.2)
Total Protein: 7.1 g/dL (ref 6.0–8.3)

## 2020-09-01 LAB — TSH: TSH: 2.59 u[IU]/mL (ref 0.35–4.50)

## 2020-09-01 LAB — LIPID PANEL
Cholesterol: 195 mg/dL (ref 0–200)
HDL: 46 mg/dL (ref >39.00)
LDL Cholesterol: 114 mg/dL — ABNORMAL HIGH (ref 0–99)
NonHDL: 148.59
Total CHOL/HDL Ratio: 4
Triglycerides: 175 mg/dL — ABNORMAL HIGH (ref 0.0–149.0)
VLDL: 35 mg/dL (ref 0.0–40.0)

## 2020-09-01 LAB — VITAMIN B12: Vitamin B-12: 525 pg/mL (ref 211–911)

## 2020-09-01 MED ORDER — LIVALO 4 MG PO TABS
4.0000 mg | ORAL_TABLET | Freq: Every day | ORAL | 3 refills | Status: DC
Start: 2020-09-01 — End: 2020-11-11

## 2020-09-01 MED ORDER — LISINOPRIL-HYDROCHLOROTHIAZIDE 20-12.5 MG PO TABS: 1.0000 | ORAL_TABLET | Freq: Every day | ORAL | 3 refills | Status: DC

## 2020-09-01 NOTE — Progress Notes (Signed)
Subjective  Chief Complaint  Patient presents with  . Annual Exam    Fasting   . Hypothyroidism  . Hypertension    HPI: Kelly Barajas is a 70 y.o. female who presents to [facility] [place] today for a Female Wellness Visit. She also has the concerns and/or needs as listed above in the chief complaint. These will be addressed in addition to the Health Maintenance Visit.   Wellness Visit: annual visit with health maintenance review and exam with and without Pap   HM: mamm up to date. dexa due. CRC up to date. Feels well. 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 Happy. No cog concerns. No mood concerns. No falls. imms up to date. eligilbe for next covid booster.  Chronic disease f/u and/or acute problem visit: (deemed necessary to be done in addition to the wellness visit):  HTN: home readings stay 130s/80s. Feeling well. Taking medications w/o adverse effects. No symptoms of CHF, angina; no palpitations, sob, cp or lower extremity edema. Compliant with meds.  On low dose zestoretic daily  Hypothyroidism: Takes 88 mcg thyroid daily.  Energy level is great.  No symptoms of high or low thyroid.  She is not currently on biotin.  Hyperlipidemia on statin.  Fasting today for recheck.  No adverse effects.  Does have history of mildly elevated LFTs.  No right upper quadrant pain or jaundice.  Osteopenia by most recent bone density was in 19.  She does not take calcium or vitamin D supplements she remains active.  No history of fractures.  GERD: Well-controlled on omeprazole 20 mg daily.  She gets this over-the-counter.  She has tried stopping it but gets reflux.  Assessment  1. Annual physical exam   2. Essential hypertension   3. Mixed hyperlipidemia   4. Acquired hypothyroidism   5. Osteopenia, unspecified location   6. Elevated LFTs   7. Gastroesophageal reflux disease without esophagitis      Plan  Female Wellness Visit:  Age  appropriate Health Maintenance and Prevention measures were discussed with patient. Included topics are cancer screening recommendations, ways to keep healthy (see AVS) including dietary and exercise recommendations, regular eye and dental care, use of seat belts, and avoidance of moderate alcohol use and tobacco use.  Mammogram due in November.  BMI: discussed patient's BMI and encouraged positive lifestyle modifications to help get to or maintain a target BMI.  HM needs and immunizations were addressed and ordered. See below for orders. See HM and immunization section for updates.  Routine labs and screening tests ordered including cmp, cbc and lipids where appropriate.  Discussed recommendations regarding Vit D and calcium supplementation (see AVS)  Chronic disease management visit and/or acute problem visit:  Osteopenia: We will defer bone density until November.  Educated on osteopenia and progression to osteoporosis.  Discussed prevention.  Recommend calcium and vitamin D supplementation.  See treatment summary.  Hypertension: Fairly good control but would like better.  Will increase Zestoretic to 20/12.5 daily.  Continue low-sodium diet.  Recheck renal function electrolytes today.  Hyperlipidemia on Livalo 4 mg nightly.  Recheck fasting lipids today.  Monitor LFTs.  Elevated LFTs: Has been stable.  If elevated then will get ramoplanin ultrasound.  GERD: Well-controlled on PPI daily.  Check for vitamin B12 deficiency.  Education given.   Follow up: 6 months to recheck blood pressure Orders Placed This Encounter  Procedures  . DG Bone Density  . CBC with Differential/Platelet  .  Comprehensive metabolic panel  . Lipid panel  . TSH  . Vitamin B12   Meds ordered this encounter  Medications  . LIVALO 4 MG TABS    Sig: Take 1 tablet (4 mg total) by mouth at bedtime.    Dispense:  90 tablet    Refill:  3      Body mass index is 32.1 kg/m. Wt Readings from Last 3 Encounters:   09/01/20 187 lb (84.8 kg)  03/03/20 190 lb 12.8 oz (86.5 kg)  08/21/19 203 lb 6.4 oz (92.3 kg)     Patient Active Problem List   Diagnosis Date Noted  . Hypothyroidism 06/01/2014    Priority: High  . Essential hypertension 11/27/2013    Priority: High    Negative Stress Test 2017: false positive EKG changes.   . Mixed hyperlipidemia 10/04/2012    Priority: High    Intolerant to crestor and lipitor: myalgias and mm cramps; tolerating livalo   . Osteopenia 08/21/2019    Priority: Medium  . Gastroesophageal reflux disease without esophagitis 11/27/2013    Priority: Medium  . Bilateral primary osteoarthritis of knee 02/19/2019    Priority: Low  . Obesity (BMI 30-39.9) 09/05/2016    Priority: Low  . Overactive bladder 03/07/2016    Priority: Low  . Vitamin D deficiency 03/07/2016    Priority: Low  . Elevated LFTs 09/01/2020    Improved over time; neg hep b and c. Likely fatty liver. Never imaged. asypmtomatic    Health Maintenance  Topic Date Due  . COVID-19 Vaccine (2 - Booster for [employer] series) 01/06/2020  . DEXA SCAN  04/12/2021  . MAMMOGRAM  05/03/2021  . TETANUS/TDAP  10/17/2021  . COLONOSCOPY (Pts 45-38yrs Insurance coverage will need to be confirmed)  03/26/2025  . INFLUENZA VACCINE  Completed  . Hepatitis C Screening  Completed  . PNA vac Low Risk Adult  Completed  . HPV VACCINES  Aged Out   Immunization History  Administered Date(s) Administered  . Fluad Quad(high Dose 65+) 02/19/2019, 03/03/2020  . Influenza, High Dose Seasonal PF 02/12/2018  . Janssen (J&J) SARS-COV-2 Vaccination 10/22/2019, 11/11/2019  . Pneumococcal Conjugate-13 09/05/2016  . Pneumococcal Polysaccharide-23 08/17/2015, 10/23/2017  . Tdap 10/18/2011  . Zoster Recombinat (Shingrix) 02/28/2018, 08/28/2018   We updated and reviewed the patient's past history in detail and it is documented below. Allergies: Patient has No Known Allergies. Past Medical History Patient  has a past  medical history of Arthritis, Essential hypertension (11/27/2013), GERD (gastroesophageal reflux disease), Hyperlipidemia, Hypothyroidism, and Osteopenia (08/21/2019). Past Surgical History Patient  has a past surgical history that includes Tonsillectomy; Dilation and curettage, diagnostic / therapeutic; and Knee arthroscopy w/ meniscal repair (Right, 07/16/2017). Family History: Patient family history includes Arthritis in her mother; CAD in her father and mother; Colon cancer (age of onset: 72) in her maternal grandmother; Depression in her mother; Diabetes in her mother; Diabetes Mellitus II in her mother; Heart attack in her father; Hyperlipidemia in her father and mother; Hypertension in her brother, mother, and sister; Hypothyroidism in her daughter and daughter. Social History:  Patient  reports that she has never smoked. She has never used smokeless tobacco. She reports that she does not drink alcohol and does not use drugs.  Review of Systems: Constitutional: negative for fever or malaise Ophthalmic: negative for photophobia, double vision or loss of vision Cardiovascular: negative for chest pain, dyspnea on exertion, or new LE swelling Respiratory: negative for SOB or persistent cough Gastrointestinal: negative for abdominal pain, change  in bowel habits or melena Genitourinary: negative for dysuria or gross hematuria, no abnormal uterine bleeding or disharge Musculoskeletal: negative for new gait disturbance or muscular weakness, has intermittent knee and hand pain due to OA Integumentary: negative for new or persistent rashes, no breast lumps Neurological: negative for TIA or stroke symptoms Psychiatric: negative for SI or delusions Allergic/Immunologic: negative for hives  Patient Care Team    Relationship Specialty Notifications Start End  Willow Ora, MD PCP - General Family Medicine  02/12/18   Eldred Manges, MD Consulting Physician Orthopedic Surgery  08/15/17   Obgyn, [facility] [place]     02/21/18   Jonelle Sidle, MD Consulting Physician Cardiology  02/21/18   Derryl Harbor, OD Consulting Physician Optometry  03/24/19   Dahlia Byes, [facility] Pharmacist Pharmacist  01/07/20    Comment: [phone]    Objective  Vitals: BP 130/82   Pulse 76   Temp 98.2 F (36.8 C) (Temporal)   Resp 16   Ht 5\' 4"  (1.626 m)   Wt 187 lb (84.8 kg)   SpO2 96%   BMI 32.10 kg/m  General:  Well developed, well nourished, no acute distress  Psych:  Alert and orientedx3,normal mood and affect HEENT:  Normocephalic, atraumatic, non-icteric sclera,  supple neck without adenopathy, mass or thyromegaly Cardiovascular:  Normal S1, S2, RRR without gallop, rub, soft systolic murmur present Respiratory:  Good breath sounds bilaterally, CTAB with normal respiratory effort Gastrointestinal: normal bowel sounds, soft, non-tender, no noted masses. No HSM MSK: Mild OA changes in hands, no contusions. Joints are without erythema or swelling.  Skin:  Warm, no rashes or suspicious lesions noted Neurologic:    Mental status is normal. CN 2-11 are normal. Gross motor and sensory exams are normal. Normal gait. No tremor Breast Exam: No mass, skin retraction or nipple discharge is appreciated in either breast. No axillary adenopathy. Fibrocystic changes are not noted    Commons side effects, risks, benefits, and alternatives for medications and treatment plan prescribed today were discussed, and the patient expressed understanding of the given instructions. Patient is instructed to call or message via MyChart if he/she has any questions or concerns regarding our treatment plan. No barriers to understanding were identified. We discussed Red Flag symptoms and signs in detail. Patient expressed understanding regarding what to do in case of urgent or emergency type symptoms.   Medication list was reconciled, printed and provided to the patient in AVS. Patient instructions and summary information was reviewed with the  patient as documented in the AVS. This note was prepared with assistance of Dragon voice recognition software. Occasional wrong-word or sound-a-like substitutions may have occurred due to the inherent limitations of voice recognition software  This visit occurred during the SARS-CoV-2 public health emergency.  Safety protocols were in place, including screening questions prior to the visit, additional usage of staff PPE, and extensive cleaning of exam room while observing appropriate contact time as indicated for disinfecting solutions.

## 2020-09-01 NOTE — Patient Instructions (Signed)
Please return in 6 months for hypertension follow up.  I will release your lab results to you on your MyChart account with further instructions. Please reply with any questions.   If you have any questions or concerns, please don't hesitate to send me a message via MyChart or call the office at [phone]. Thank you for visiting with [place] today! It's our pleasure caring for you.   Calcium Intake Recommendations You can take Caltrate Plus twice a day or get it through your diet or other OTC supplements (Viactiv, OsCal etc)  Calcium is a mineral that affects many functions in the body, including:  Blood clotting.  Blood vessel function.  Nerve impulse conduction.  Hormone secretion.  Muscle contraction.  Bone and teeth functions.  Most of your body's calcium supply is stored in your bones and teeth. When your calcium stores are low, you may be at risk for low bone mass, bone loss, and bone fractures. Consuming enough calcium helps to grow healthy bones and teeth and to prevent breakdown over time. It is very important that you get enough calcium if you are:  A child undergoing rapid growth.  An adolescent girl.  A pre- or post-menopausal woman.  A woman whose menstrual cycle has stopped due to anorexia nervosa or regular intense exercise.  An individual with lactose intolerance or a milk allergy.  A vegetarian.  What is my plan? Try to consume the recommended amount of calcium daily based on your age. Depending on your overall health, your health care provider may recommend increased calcium intake.General daily calcium intake recommendations by age are:  Birth to 6 months: 200 mg.  Infants 7 to 12 months: 260 mg.  Children 1 to 3 years: 700 mg.  Children 4 to 8 years: 1,000 mg.  Children 9 to 13 years: 1,300 mg.  Teens 14 to 18 years: 1,300 mg.  Adults 19 to 50 years: 1,000 mg.  Adult women 51 to 70 years: 1,200 mg.  Adult men 51 to 70 years: 1,000  mg.  Adults 71 years and older: 1,200 mg.  Pregnant and breastfeeding teens: 1,300 mg.  Pregnant and breastfeeding adults: 1,000 mg.  What do I need to know about calcium intake?  In order for the body to absorb calcium, it needs vitamin D. You can get vitamin D through (we recommend getting [phone] units of Vitamin D daily) ? Direct exposure of the skin to sunlight. ? Foods, such as egg yolks, liver, saltwater fish, and fortified milk. ? Supplements.  Consuming too much calcium may cause: ? Constipation. ? Decreased absorption of iron and zinc. ? Kidney stones.  Calcium supplements may interact with certain medicines. Check with your health care provider before starting any calcium supplements.  Try to get most of your calcium from food. What foods can I eat? Grains  Fortified oatmeal. Fortified ready-to-eat cereals. Fortified frozen waffles. Vegetables Turnip greens. Broccoli. Fruits Fortified orange juice. Meats and Other Protein Sources Canned sardines with bones. Canned salmon with bones. Soy beans. Tofu. Baked beans. Almonds. [place] nuts. Sunflower seeds. Dairy Milk. Yogurt. Cheese. Cottage cheese. Beverages Fortified soy milk. Fortified rice milk. Sweets/Desserts Pudding. Ice Cream. Milkshakes. Blackstrap molasses. The items listed above may not be a complete list of recommended foods or beverages. Contact your dietitian for more options. What foods can affect my calcium intake? It may be more difficult for your body to use calcium or calcium may leave your body more quickly if you consume large amounts of:  Sodium.  Protein.  Caffeine.  Alcohol.  This information is not intended to replace advice given to you by your health care provider. Make sure you discuss any questions you have with your health care provider. Document Released: 01/11/2004 Document Revised: 12/17/2015 Document Reviewed: 11/04/2013 Elsevier Interactive Patient Education  2018 [employer].   Osteopenia  Osteopenia is a loss of thickness (density) inside the bones. Another name for osteopenia is low bone mass. Mild osteopenia is a normal part of aging. It is not a disease, and it does not cause symptoms. However, if you have osteopenia and continue to lose bone mass, you could develop a condition that causes the bones to become thin and break more easily (osteoporosis). Osteoporosis can cause you to lose some height, have back pain, and have a stooped posture. Although osteopenia is not a disease, making changes to your lifestyle and diet can help to prevent osteopenia from developing into osteoporosis. What are the causes? Osteopenia is caused by loss of calcium in the bones. Bones are constantly changing. Old bone cells are continually being replaced with new bone cells. This process builds new bone. The mineral calcium is needed to build new bone and maintain bone density. Bone density is usually highest around age 22. After that, most people's bodies cannot replace all the bone they have lost with new bone. What increases the risk? You are more likely to develop this condition if: You are older than age 62. You are a woman who went through menopause early. You have a long illness that keeps you in bed. You do not get enough exercise. You lack certain nutrients (malnutrition). You have an overactive thyroid gland (hyperthyroidism). You use products that contain nicotine or tobacco, such as cigarettes, e-cigarettes and chewing tobacco, or you drink a lot of alcohol. You are taking medicines that weaken the bones, such as steroids. What are the signs or symptoms? This condition does not cause any symptoms. You may have a slightly higher risk for bone breaks (fractures), so getting fractures more easily than normal may be an indication of osteopenia. How is this diagnosed? This condition may be diagnosed based on an X-ray exam that measures bone density (dual-energy X-ray  absorptiometry, or DEXA). This test can measure bone density in your hips, spine, and wrists. Osteopenia has no symptoms, so this condition is usually diagnosed after a routine bone density screening test is done for osteoporosis. This routine screening is usually done for: Women who are age 62 or older. Men who are age 54 or older. If you have risk factors for osteopenia, you may have the screening test at an earlier age. How is this treated? Making dietary and lifestyle changes can lower your risk for osteoporosis. If you have severe osteopenia that is close to becoming osteoporosis, this condition can be treated with medicines and dietary supplements such as calcium and vitamin D. These supplements help to rebuild bone density. Follow these instructions at home: Eating and drinking Eat a diet that is high in calcium and vitamin D. Calcium is found in dairy products, beans, salmon, and leafy green vegetables like spinach and broccoli. Look for foods that have vitamin D and calcium added to them (fortified foods), such as orange juice, cereal, and bread.   Lifestyle Do 30 minutes or more of a weight-bearing exercise every day, such as walking, jogging, or playing a sport. These types of exercises strengthen the bones. Do not use any products that contain nicotine or tobacco, such as cigarettes, e-cigarettes,  and chewing tobacco. If you need help quitting, ask your health care provider. Do not drink alcohol if: Your health care provider tells you not to drink. You are pregnant, may be pregnant, or are planning to become pregnant. If you drink alcohol: Limit how much you use to: 0-1 drink a day for women. 0-2 drinks a day for men. Be aware of how much alcohol is in your drink. In the [place] one drink equals one 12 oz bottle of beer (355 mL), one 5 oz glass of wine (148 mL), or one 1 oz glass of hard liquor (44 mL). General instructions Take over-the-counter and prescription medicines only as  told by your health care provider. These include vitamins and supplements. Take precautions at home to lower your risk of falling, such as: Keeping rooms well-lit and free of clutter, such as cords. Installing safety rails on stairs. Using rubber mats in the bathroom or other areas that are often wet or slippery. Keep all follow-up visits. This is important. Contact a health care provider if: You have not had a bone density screening for osteoporosis and you are: A woman who is age 8 or older. A man who is age 63 or older. You are a postmenopausal woman who has not had a bone density screening for osteoporosis. You are older than age 57 and you want to know if you should have bone density screening for osteoporosis. Summary Osteopenia is a loss of thickness (density) inside the bones. Another name for osteopenia is low bone mass. Osteopenia is not a disease, but it may increase your risk for a condition that causes the bones to become thin and break more easily (osteoporosis). You may be at risk for osteopenia if you are older than age 59 or if you are a woman who went through early menopause. Osteopenia does not cause any symptoms, but it can be diagnosed with a bone density screening test. Dietary and lifestyle changes are the first treatment for osteopenia. These may lower your risk for osteoporosis. This information is not intended to replace advice given to you by your health care provider. Make sure you discuss any questions you have with your health care provider. Document Revised: 11/13/2019 Document Reviewed: 11/13/2019 Elsevier Patient Education  2021 [employer].

## 2020-09-20 ENCOUNTER — Other Ambulatory Visit: Payer: Self-pay | Admitting: Family Medicine

## 2020-09-20 DIAGNOSIS — Z0000 Encounter for general adult medical examination without abnormal findings: Secondary | ICD-10-CM

## 2020-09-20 DIAGNOSIS — M8580 Other specified disorders of bone density and structure, unspecified site: Secondary | ICD-10-CM

## 2020-10-31 ENCOUNTER — Other Ambulatory Visit: Payer: Self-pay | Admitting: Family Medicine

## 2020-11-05 ENCOUNTER — Telehealth: Payer: Self-pay

## 2020-11-05 NOTE — Chronic Care Management (AMB) (Signed)
Chronic Care Management Pharmacy Assistant   Name: Kelly Barajas  MRN: [id number] DOB: [birth year]  Reason for Encounter: Hypertension Disease State Call  Recent office visits:  09/01/20- Asencion Partridge, MD- seen for annual wellness visit, increased lisinopril- HCTZ from 10-12.5 mg daily to 20-12.5 mg daily, follow up 6 months   Recent consult visits:  No visits noted  Hospital visits:  None in previous 6 months  Medications: Outpatient Encounter Medications as of 11/05/2020  Medication Sig  . aspirin EC 81 MG tablet Take 81 mg by mouth at bedtime.  . Coenzyme Q10 (COQ10) 100 MG CAPS Take by mouth.  . fish oil-omega-3 fatty acids 1000 MG capsule Take 1 g by mouth 4 (four) times daily.   Marland Kitchen lisinopril-hydrochlorothiazide (ZESTORETIC) 20-12.5 MG tablet Take 1 tablet by mouth daily.  Marland Kitchen LIVALO 4 MG TABS Take 1 tablet (4 mg total) by mouth at bedtime.  . magnesium gluconate (MAGONATE) 500 MG tablet Take 500 mg by mouth 2 (two) times daily.  . naproxen sodium (ANAPROX) 220 MG tablet Take 440 mg by mouth daily as needed. For pain  . omeprazole (PRILOSEC) 20 MG capsule Take 20 mg by mouth daily.  Marland Kitchen SYNTHROID 88 MCG tablet TAKE 1 TABLET BY MOUTH ONCE DAILY BEFORE BREAKFAST   No facility-administered encounter medications on file as of 11/05/2020.    Reviewed chart prior to disease state call. Spoke with patient regarding BP  Recent Office Vitals: BP Readings from Last 3 Encounters:  09/01/20 130/82  03/03/20 134/82  08/21/19 128/76   Pulse Readings from Last 3 Encounters:  09/01/20 76  03/03/20 85  08/21/19 81    Wt Readings from Last 3 Encounters:  09/01/20 187 lb (84.8 kg)  03/03/20 190 lb 12.8 oz (86.5 kg)  08/21/19 203 lb 6.4 oz (92.3 kg)     Kidney Function Lab Results  Component Value Date/Time   CREATININE 0.81 09/01/2020 09:13 AM   CREATININE 0.85 08/21/2019 09:23 AM   GFR 73.26 09/01/2020 09:13 AM   GFRNONAA 70 (L) 09/22/2011 05:30 PM   GFRAA 81 (L)  09/22/2011 05:30 PM    BMP Latest Ref Rng & Units 09/01/2020 08/21/2019 08/14/2018  Glucose 70 - 99 mg/dL 99 97 99  BUN 6 - 23 mg/dL 19 18 13   Creatinine 0.40 - 1.20 mg/dL 5.36 4.68  Sodium 135 - 145 mEq/L 137 139 139  Potassium 3.5 - 5.1 mEq/L 3.9 4.5 4.2  Chloride 96 - 112 mEq/L 99 100 101  CO2 19 - 32 mEq/L 28 31 26   Calcium 8.4 - 10.5 mg/dL 0.32 12.2   I spoke with Kelly Barajas this morning and she is doing well. [place] and couldn't offer a lengthy conversation. Despite not keeping track of her BP previously she agreed to start a log when she returns from vacation in which we will connect about. She is up to date on all of her medications and had no concerns otherwise.   Current antihypertensive regimen:  Lisinopril HCTZ 20-12.5 mg daily  How often are you checking your Blood Pressure?  Patient stated she does not check her BP   Current home BP readings: N/A  What recent interventions/DTPs have been made by any provider to improve Blood Pressure control since last CPP Visit: No recent interventions   Any recent hospitalizations or ED visits since last visit with CPP? No  Adherence Review: Is the patient currently on ACE/ARB medication?  Yes Does the patient have >5 day gap between last estimated fill dates? No  Star Rating Drugs: Lisinopril HCTZ 20-12.5 mg- 90 DS last filled 09/01/20  Purvis Kilts CPA, CMA

## 2020-11-11 ENCOUNTER — Other Ambulatory Visit: Payer: Self-pay | Admitting: Family Medicine

## 2020-12-01 ENCOUNTER — Telehealth: Payer: Self-pay | Admitting: Family Medicine

## 2020-12-01 NOTE — Telephone Encounter (Signed)
Spoke with patient she req a call back next week 12/06/20 to schedule AWV

## 2020-12-07 ENCOUNTER — Telehealth: Payer: Self-pay | Admitting: Family Medicine

## 2020-12-07 NOTE — Telephone Encounter (Signed)
Copied from CRM [id number]. Topic: Medicare AWV >> Dec 07, 2020 11:22 AM Harris-Coley, [place]on Gully wrote: Reason for CRM: Left message for patient to schedule Annual Wellness Visit.  Please schedule with Nurse Health Advisor Lanier Ensign, RN at [facility].

## 2020-12-10 IMAGING — MG DIGITAL SCREENING BILAT W/ CAD
4 series · 4 of 4 positions shown · non-contrast
Comparison: Previous exam(s).

CLINICAL DATA: Screening.

EXAM:
DIGITAL SCREENING BILATERAL MAMMOGRAM WITH CAD

[R CC]
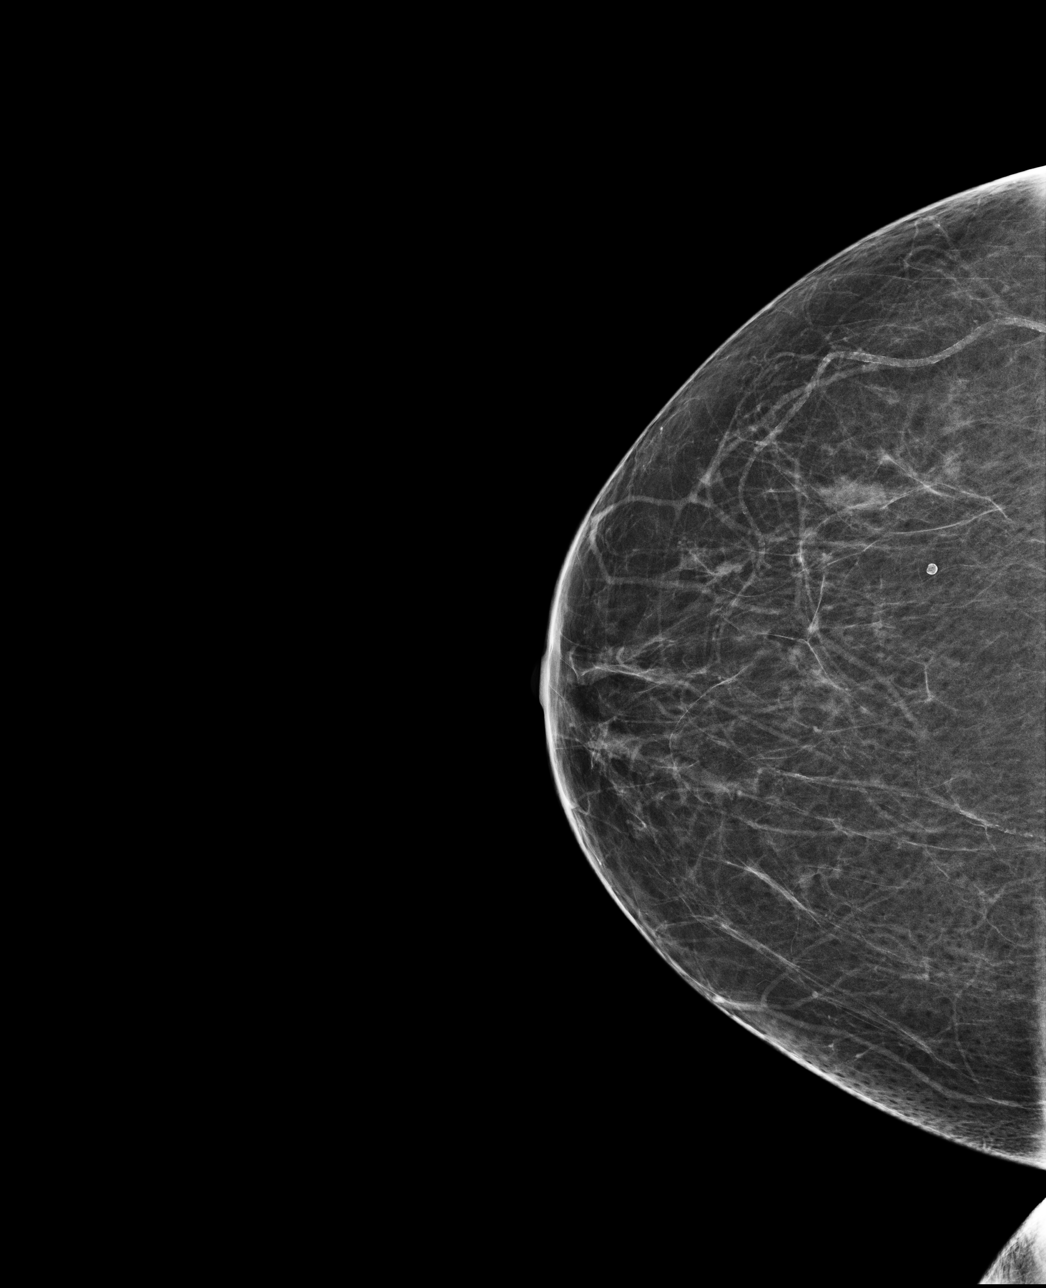

[R MLO]
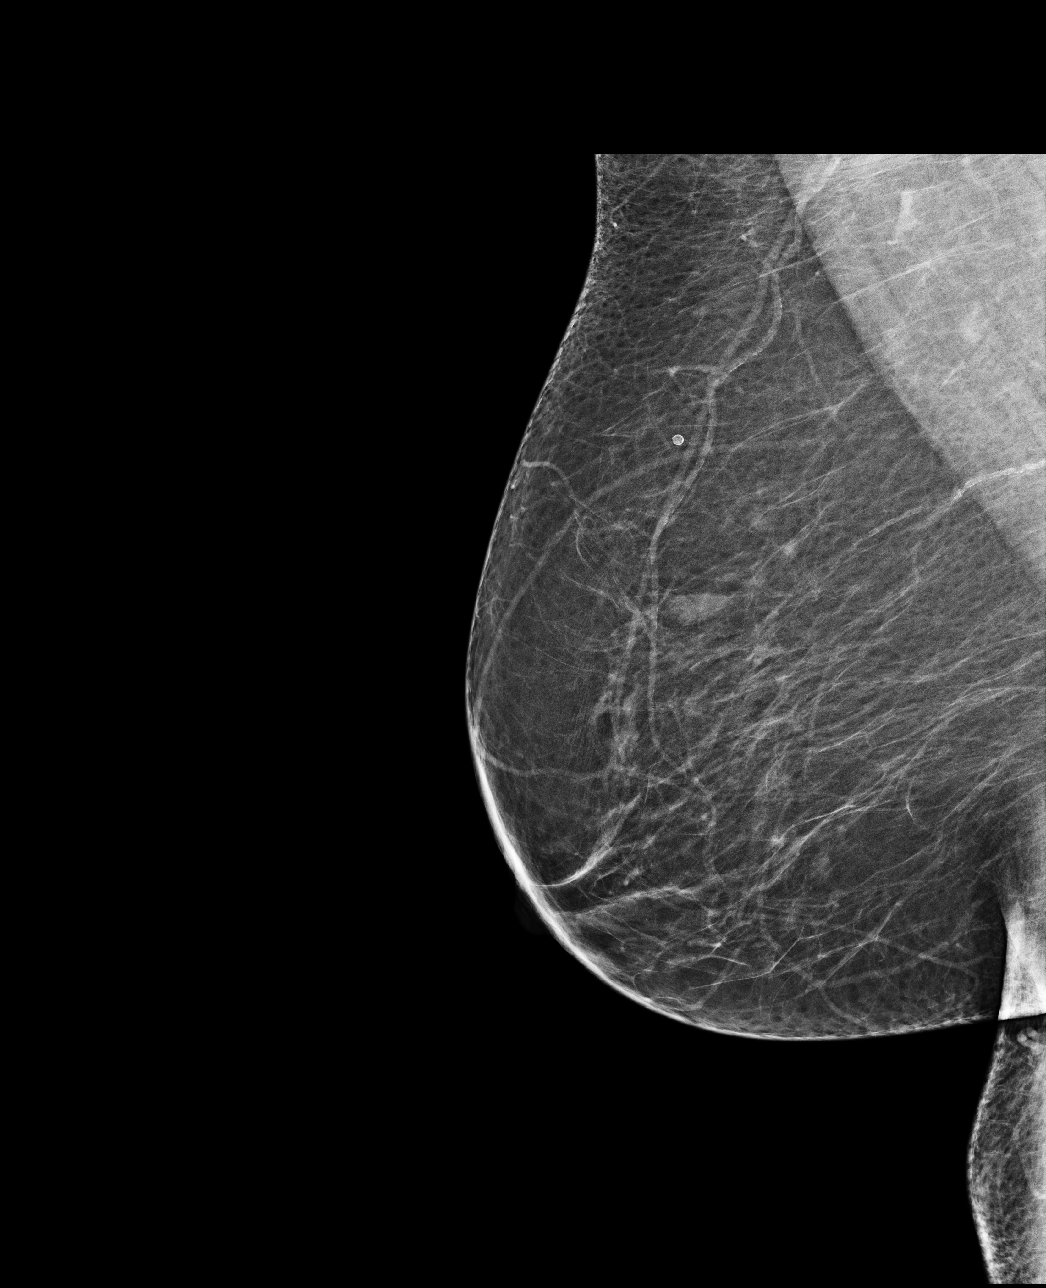

[L CC]
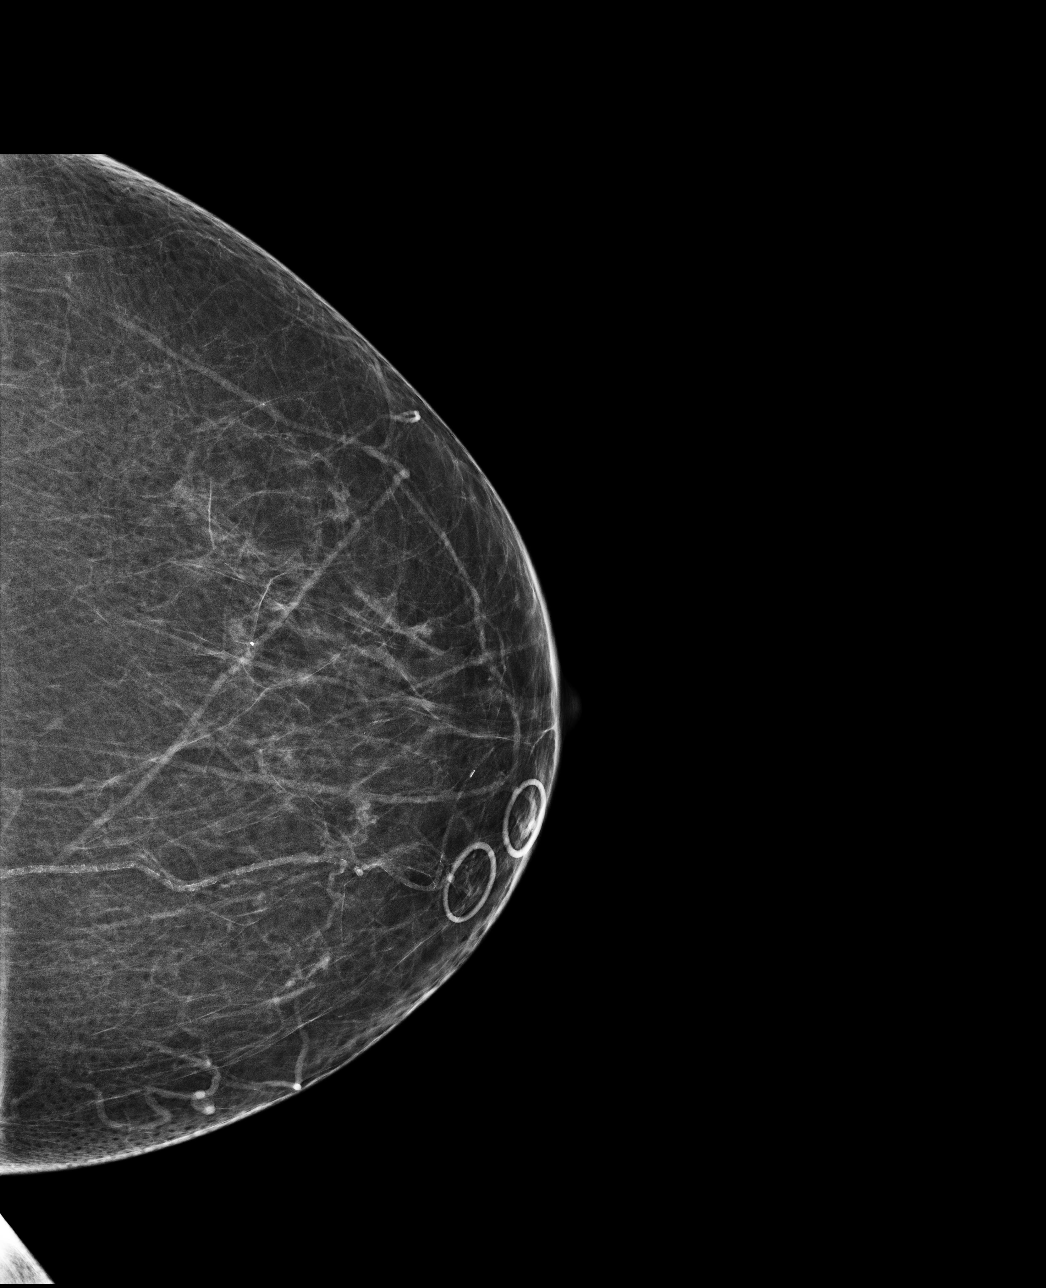

[L MLO]
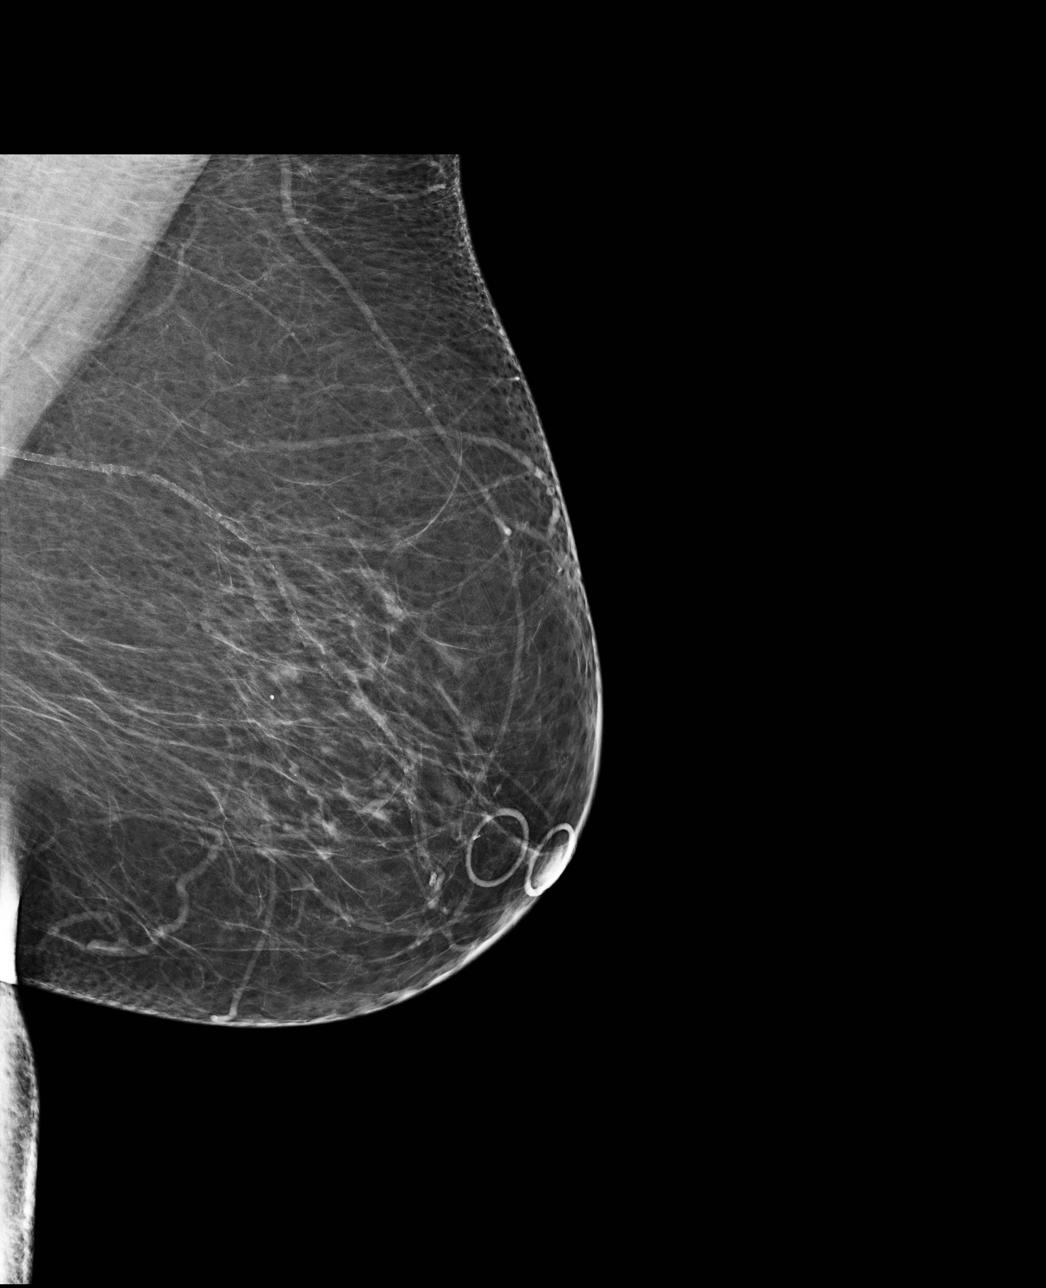

[4 of 4 positions shown; findings below may reference images not displayed]

ACR Breast Density Category b: There are scattered areas of
fibroglandular density.
FINDINGS: There are no findings suspicious for malignancy. Images were
processed with CAD.
IMPRESSION: No mammographic evidence of malignancy. A result letter of this
screening mammogram will be mailed directly to the patient.

RECOMMENDATION:
Screening mammogram in one year. (Code:AS-G-LCT)

BI-RADS CATEGORY  1: Negative.

## 2021-01-19 ENCOUNTER — Telehealth: Payer: Self-pay | Admitting: Pharmacist

## 2021-01-19 NOTE — Chronic Care Management (AMB) (Addendum)
Chronic Care Management Pharmacy Assistant   Name: Kelly Barajas  MRN: [id number] DOB: May 20, 1951   Reason for Encounter: Hypertension Disease State Call    Recent office visits:  None  Recent consult visits:  None  Hospital visits:  None in previous 6 months  Medications: Outpatient Encounter Medications as of 01/19/2021  Medication Sig   aspirin EC 81 MG tablet Take 81 mg by mouth at bedtime.   Coenzyme Q10 (COQ10) 100 MG CAPS Take by mouth.   fish oil-omega-3 fatty acids 1000 MG capsule Take 1 g by mouth 4 (four) times daily.    lisinopril-hydrochlorothiazide (ZESTORETIC) 20-12.5 MG tablet Take 1 tablet by mouth daily.   LIVALO 4 MG TABS TAKE 1 TABLET BY MOUTH AT BEDTIME   magnesium gluconate (MAGONATE) 500 MG tablet Take 500 mg by mouth 2 (two) times daily.   naproxen sodium (ANAPROX) 220 MG tablet Take 440 mg by mouth daily as needed. For pain   omeprazole (PRILOSEC) 20 MG capsule Take 20 mg by mouth daily.   SYNTHROID 88 MCG tablet TAKE 1 TABLET BY MOUTH ONCE DAILY BEFORE BREAKFAST   No facility-administered encounter medications on file as of 01/19/2021.   Reviewed chart prior to disease state call. Spoke with patient regarding BP  Recent Office Vitals: BP Readings from Last 3 Encounters:  09/01/20 130/82  03/03/20 134/82  08/21/19 128/76   Pulse Readings from Last 3 Encounters:  09/01/20 76  03/03/20 85  08/21/19 81    Wt Readings from Last 3 Encounters:  09/01/20 187 lb (84.8 kg)  03/03/20 190 lb 12.8 oz (86.5 kg)  08/21/19 203 lb 6.4 oz (92.3 kg)     Kidney Function Lab Results  Component Value Date/Time   CREATININE 0.81 09/01/2020 09:13 AM   CREATININE 0.85 08/21/2019 09:23 AM   GFR 73.26 09/01/2020 09:13 AM   GFRNONAA 70 (L) 09/22/2011 05:30 PM   GFRAA 81 (L) 09/22/2011 05:30 PM    BMP Latest Ref Rng & Units 09/01/2020 08/21/2019 08/14/2018  Glucose 70 - 99 mg/dL 99 97 99  BUN 6 - 23 mg/dL 19 18 13   Creatinine 0.40 - 1.20 mg/dL  0.10 2.72  Sodium 135 - 145 mEq/L 137 139 139  Potassium 3.5 - 5.1 mEq/L 3.9 4.5 4.2  Chloride 96 - 112 mEq/L 99 100 101  CO2 19 - 32 mEq/L 28 31 26   Calcium 8.4 - 10.5 mg/dL 5.36 64.4    Current antihypertensive regimen:  Lisinopril-HCTZ 20-12.5 mg daily  How often are you checking your Blood Pressure? when feeling symptomatic  Current home BP readings: patient states she has not had any symptoms lately so therefore she has not checked her blood pressure.  What recent interventions/DTPs have been made by any provider to improve Blood Pressure control since last CPP Visit: No interventions or DTP's.  Any recent hospitalizations or ED visits since last visit with CPP? No  What diet changes have been made to improve Blood Pressure Control?  Patient states her biggest meal is at lunch. She only eats light meals at night.  What exercise is being done to improve your Blood Pressure Control?  Patient states she currently exercises at the gym. She does silver sneakers twice a week and rides 3 miles on bicycle.  Adherence Review: Is the patient currently on ACE/ARB medication? Yes Does the patient have >5 day gap between last estimated fill dates? No   Patient scheduled a follow up appointment with the clinical pharmacist  04/04/2021 at 9:00 am.  Future Appointments  Date Time Provider Department Center  03/03/2021  8:30 AM [facility] DX DEXA 1 [facility]  03/08/2021  8:30 AM Willow Ora, MD [facility]  04/04/2021  9:00 AM [facility] PHARMACIST [facility]    Star Rating Drugs: Lisinopril/HCTZ 20-12.5 mg last filled 11/25/2020 90 DS  April D Calhoun, [facility] Clinical Pharmacist Assistant [phone]   10 minutes spent in review, coordination, and documentation.  Reviewed by: Willa Frater, PharmD Clinical Pharmacist [phone]

## 2021-01-31 ENCOUNTER — Other Ambulatory Visit: Payer: Self-pay | Admitting: Family Medicine

## 2021-03-03 ENCOUNTER — Other Ambulatory Visit: Payer: Self-pay

## 2021-03-03 ENCOUNTER — Ambulatory Visit
Admission: RE | Admit: 2021-03-03 | Discharge: 2021-03-03 | Disposition: A | Discharge status: Home / Self Care | Payer: PPO | Source: Ambulatory Visit | Attending: Family Medicine | Admitting: Family Medicine

## 2021-03-03 DIAGNOSIS — Z780 Asymptomatic menopausal state: Secondary | ICD-10-CM | POA: Diagnosis not present

## 2021-03-03 DIAGNOSIS — M8589 Other specified disorders of bone density and structure, multiple sites: Secondary | ICD-10-CM | POA: Diagnosis not present

## 2021-03-03 DIAGNOSIS — M8580 Other specified disorders of bone density and structure, unspecified site: Secondary | ICD-10-CM

## 2021-03-03 DIAGNOSIS — Z0000 Encounter for general adult medical examination without abnormal findings: Secondary | ICD-10-CM

## 2021-03-08 ENCOUNTER — Encounter: Payer: Self-pay | Admitting: Family Medicine

## 2021-03-08 ENCOUNTER — Ambulatory Visit (INDEPENDENT_AMBULATORY_CARE_PROVIDER_SITE_OTHER): Payer: PPO | Admitting: Family Medicine

## 2021-03-08 ENCOUNTER — Other Ambulatory Visit: Payer: Self-pay

## 2021-03-08 VITALS — BP 140/90 | HR 82 | Temp 98.1°F | Ht 64.0 in | Wt 189.6 lb

## 2021-03-08 DIAGNOSIS — E782 Mixed hyperlipidemia: Secondary | ICD-10-CM | POA: Diagnosis not present

## 2021-03-08 DIAGNOSIS — I10 Essential (primary) hypertension: Secondary | ICD-10-CM | POA: Diagnosis not present

## 2021-03-08 DIAGNOSIS — Z7185 Encounter for immunization safety counseling: Secondary | ICD-10-CM

## 2021-03-08 DIAGNOSIS — E669 Obesity, unspecified: Secondary | ICD-10-CM | POA: Diagnosis not present

## 2021-03-08 DIAGNOSIS — E039 Hypothyroidism, unspecified: Secondary | ICD-10-CM

## 2021-03-08 DIAGNOSIS — Z23 Encounter for immunization: Secondary | ICD-10-CM | POA: Diagnosis not present

## 2021-03-08 DIAGNOSIS — M8580 Other specified disorders of bone density and structure, unspecified site: Secondary | ICD-10-CM

## 2021-03-08 MED ORDER — LISINOPRIL-HYDROCHLOROTHIAZIDE 20-25 MG PO TABS: 1.0000 | ORAL_TABLET | Freq: Every day | ORAL | 3 refills | Status: DC

## 2021-03-08 NOTE — Patient Instructions (Signed)
Please return in 3 months for blood pressure recheck.  I have ordered a larger dose of your blood pressure pill. This should help.  Start taking your blood pressures at home on the new pill: we want them 120s/70s ideally but always < 140/90. Eat a low salt diet.   If you have any questions or concerns, please don't hesitate to send me a message via MyChart or call the office at [phone]. Thank you for visiting with [place] today! It's our pleasure caring for you.   Osteopenia Osteopenia is a loss of thickness (density) inside the bones. Another name for osteopenia is low bone mass. Mild osteopenia is a normal part of aging. It is not a disease, and it does not cause symptoms. However, if you have osteopenia and continue to lose bone mass, you could develop a condition that causes the bones to become thin and break more easily (osteoporosis). Osteoporosis can cause you to lose some height, have back pain, and have a stooped posture. Although osteopenia is not a disease, making changes to your lifestyle and diet can help to prevent osteopenia from developing into osteoporosis. What are the causes? Osteopenia is caused by loss of calcium in the bones. Bones are constantly changing. Old bone cells are continually being replaced with new bone cells. This process builds new bone. The mineral calcium is needed to build new bone and maintain bone density. Bone density is usually highest around age 44. After that, most people's bodies cannot replace all the bone they have lost with new bone. What increases the risk? You are more likely to develop this condition if: You are older than age 68. You are a woman who went through menopause early. You have a long illness that keeps you in bed. You do not get enough exercise. You lack certain nutrients (malnutrition). You have an overactive thyroid gland (hyperthyroidism). You use products that contain nicotine or tobacco, such as cigarettes, e-cigarettes and  chewing tobacco, or you drink a lot of alcohol. You are taking medicines that weaken the bones, such as steroids. What are the signs or symptoms? This condition does not cause any symptoms. You may have a slightly higher risk for bone breaks (fractures), so getting fractures more easily than normal may be an indication of osteopenia. How is this diagnosed? This condition may be diagnosed based on an X-ray exam that measures bone density (dual-energy X-ray absorptiometry, or DEXA). This test can measure bone density in your hips, spine, and wrists. Osteopenia has no symptoms, so this condition is usually diagnosed after a routine bone density screening test is done for osteoporosis. This routine screening is usually done for: Women who are age 38 or older. Men who are age 70 or older. If you have risk factors for osteopenia, you may have the screening test at an earlier age. How is this treated? Making dietary and lifestyle changes can lower your risk for osteoporosis. If you have severe osteopenia that is close to becoming osteoporosis, this condition can be treated with medicines and dietary supplements such as calcium and vitamin D. These supplements help to rebuild bone density. Follow these instructions at home: Eating and drinking Eat a diet that is high in calcium and vitamin D. Calcium is found in dairy products, beans, salmon, and leafy green vegetables like spinach and broccoli. Look for foods that have vitamin D and calcium added to them (fortified foods), such as orange juice, cereal, and bread.  Lifestyle Do 30 minutes or more of a  weight-bearing exercise every day, such as walking, jogging, or playing a sport. These types of exercises strengthen the bones. Do not use any products that contain nicotine or tobacco, such as cigarettes, e-cigarettes, and chewing tobacco. If you need help quitting, ask your health care provider. Do not drink alcohol if: Your health care provider tells  you not to drink. You are pregnant, may be pregnant, or are planning to become pregnant. If you drink alcohol: Limit how much you use to: 0-1 drink a day for women. 0-2 drinks a day for men. Be aware of how much alcohol is in your drink. In the [place] one drink equals one 12 oz bottle of beer (355 mL), one 5 oz glass of wine (148 mL), or one 1 oz glass of hard liquor (44 mL). General instructions Take over-the-counter and prescription medicines only as told by your health care provider. These include vitamins and supplements. Take precautions at home to lower your risk of falling, such as: Keeping rooms well-lit and free of clutter, such as cords. Installing safety rails on stairs. Using rubber mats in the bathroom or other areas that are often wet or slippery. Keep all follow-up visits. This is important. Contact a health care provider if: You have not had a bone density screening for osteoporosis and you are: A woman who is age 39 or older. A man who is age 55 or older. You are a postmenopausal woman who has not had a bone density screening for osteoporosis. You are older than age 74 and you want to know if you should have bone density screening for osteoporosis. Summary Osteopenia is a loss of thickness (density) inside the bones. Another name for osteopenia is low bone mass. Osteopenia is not a disease, but it may increase your risk for a condition that causes the bones to become thin and break more easily (osteoporosis). You may be at risk for osteopenia if you are older than age 31 or if you are a woman who went through early menopause. Osteopenia does not cause any symptoms, but it can be diagnosed with a bone density screening test. Dietary and lifestyle changes are the first treatment for osteopenia. These may lower your risk for osteoporosis. This information is not intended to replace advice given to you by your health care provider. Make sure you discuss any questions you have  with your health care provider. Document Revised: 11/13/2019 Document Reviewed: 11/13/2019 Elsevier Patient Education  2022 [employer].

## 2021-03-08 NOTE — Progress Notes (Signed)
Subjective  CC:  Chief Complaint  Patient presents with   Hyperlipidemia   Hypertension   Hypothyroidism    HPI: Kelly Barajas is a 70 y.o. female who presents to the office today to address the problems listed above in the chief complaint. Hypertension f/u: Control is fair . Pt reports she is doing well. taking medications as instructed, no medication side effects noted, no TIAs, no chest pain on exertion, no dyspnea on exertion, no swelling of ankles. Had been well controlled but elevated today in spite of increase in meds last visit. Reports [place] 2 mornings ago. Otherwise, diet is fairly healthy. Weight is stable. She has been to the gym regulalry for last several months: bicycle, silver sneakers. She denies adverse effects from his BP medications. Compliance with medication is good.  Reviewed recent bone density: lowest T =-1.8. on caltrate plus bid now. Exercising.  HLD on livalo 4 and tolerating it Low thyroid w/o sxs.   Assessment  1. Essential hypertension   2. Mixed hyperlipidemia   3. Osteopenia, unspecified location   4. Acquired hypothyroidism   5. Need for immunization against influenza   6. Vaccine counseling   7. Obesity (BMI 30-39.9)      Plan   Hypertension f/u: BP control is marginally well  controlled. Increase to zestoretic 20/25, start home monitoring and recheck in 3 months. Low salt diet recommended. Discussed weight loss Hyperlipidemia f/u: at goal, continue livalo 4mg  qhs Low thyroid is clinically euthyroid. Osteopenia: continue caltrate plus and discussed strength training in addition to cardi exercises.  Flu shot today. Counseled on covid booster Education regarding management of these chronic disease states was given. Management strategies discussed on successive visits include dietary and exercise recommendations, goals of achieving and maintaining IBW, and lifestyle modifications aiming for adequate sleep and minimizing stressors.    Follow up: 3 mo for bp recheck  Orders Placed This Encounter  Procedures   Flu Vaccine QUAD High Dose(Fluad)    Meds ordered this encounter  Medications   lisinopril-hydrochlorothiazide (ZESTORETIC) 20-25 MG tablet    Sig: Take 1 tablet by mouth daily.    Dispense:  90 tablet    Refill:  3      BP Readings from Last 3 Encounters:  03/08/21 140/90  09/01/20 130/82  03/03/20 134/82   Wt Readings from Last 3 Encounters:  03/08/21 189 lb 9.6 oz (86 kg)  09/01/20 187 lb (84.8 kg)  03/03/20 190 lb 12.8 oz (86.5 kg)    Lab Results  Component Value Date   CHOL 195 09/01/2020   CHOL 199 08/21/2019   CHOL 186 08/14/2018   Lab Results  Component Value Date   HDL 46.00 09/01/2020   HDL 43.70 08/21/2019   HDL 39.20 08/14/2018   Lab Results  Component Value Date   LDLCALC 114 (H) 09/01/2020   LDLCALC 110 (H) 08/14/2018   Lab Results  Component Value Date   TRIG 175.0 (H) 09/01/2020   TRIG 215.0 (H) 08/21/2019   TRIG 183.0 (H) 08/14/2018   Lab Results  Component Value Date   CHOLHDL 4 09/01/2020   CHOLHDL 5 08/21/2019   CHOLHDL 5 08/14/2018   Lab Results  Component Value Date   LDLDIRECT 120.0 08/21/2019   LDLDIRECT 119.0 08/15/2017   Lab Results  Component Value Date   CREATININE 0.81 09/01/2020   BUN 19 09/01/2020   NA 137 09/01/2020   K 3.9 09/01/2020   CL 99 09/01/2020   CO2 28  09/01/2020    The 10-year ASCVD risk score (Arnett DK, et al., 2019) is: 16.8%   Values used to calculate the score:     Age: 52 years     Sex: Female     Is Non-Hispanic African American: No     Diabetic: No     Tobacco smoker: No     Systolic Blood Pressure: 140 mmHg     Is BP treated: Yes     HDL Cholesterol: 46 mg/dL     Total Cholesterol: 195 mg/dL  I reviewed the patients updated PMH, FH, and SocHx.    Patient Active Problem List   Diagnosis Date Noted   Hypothyroidism 06/01/2014    Priority: High   Essential hypertension 11/27/2013    Priority: High    Mixed hyperlipidemia 10/04/2012    Priority: High   Osteopenia 08/21/2019    Priority: Medium   Gastroesophageal reflux disease without esophagitis 11/27/2013    Priority: Medium   Bilateral primary osteoarthritis of knee 02/19/2019    Priority: Low   Obesity (BMI 30-39.9) 09/05/2016    Priority: Low   Overactive bladder 03/07/2016    Priority: Low   Vitamin D deficiency 03/07/2016    Priority: Low   Elevated LFTs 09/01/2020    Allergies: Patient has no known allergies.  Social History: Patient  reports that she has never smoked. She has never used smokeless tobacco. She reports that she does not drink alcohol and does not use drugs.  Current Meds  Medication Sig   aspirin EC 81 MG tablet Take 81 mg by mouth at bedtime.   CALCIUM PO Take by mouth.   Coenzyme Q10 (COQ10) 100 MG CAPS Take by mouth.   fish oil-omega-3 fatty acids 1000 MG capsule Take 1 g by mouth 4 (four) times daily.    lisinopril-hydrochlorothiazide (ZESTORETIC) 20-25 MG tablet Take 1 tablet by mouth daily.   LIVALO 4 MG TABS TAKE 1 TABLET BY MOUTH AT BEDTIME   magnesium gluconate (MAGONATE) 500 MG tablet Take 500 mg by mouth 2 (two) times daily.   naproxen sodium (ANAPROX) 220 MG tablet Take 440 mg by mouth daily as needed. For pain   omeprazole (PRILOSEC) 20 MG capsule Take 20 mg by mouth daily.   SYNTHROID 88 MCG tablet TAKE 1 TABLET BY MOUTH ONCE DAILY BEFORE BREAKFAST   [DISCONTINUED] lisinopril-hydrochlorothiazide (ZESTORETIC) 20-12.5 MG tablet Take 1 tablet by mouth daily.    Review of Systems: Cardiovascular: negative for chest pain, palpitations, leg swelling, orthopnea Respiratory: negative for SOB, wheezing or persistent cough Gastrointestinal: negative for abdominal pain Genitourinary: negative for dysuria or gross hematuria  Objective  Vitals: BP 140/90   Pulse 82   Temp 98.1 F (36.7 C) (Temporal)   Ht 5\' 4"  (1.626 m)   Wt 189 lb 9.6 oz (86 kg)   SpO2 97%   BMI 32.54 kg/m  General: no  acute distress  Psych:  Alert and oriented, normal mood and affect HEENT:  Normocephalic, atraumatic, supple neck  Cardiovascular:  RRR without murmur. no edema Respiratory:  Good breath sounds bilaterally, CTAB with normal respiratory effort Neurologic:   Mental status is normal Commons side effects, risks, benefits, and alternatives for medications and treatment plan prescribed today were discussed, and the patient expressed understanding of the given instructions. Patient is instructed to call or message via MyChart if he/she has any questions or concerns regarding our treatment plan. No barriers to understanding were identified. We discussed Red Flag symptoms and signs in  detail. Patient expressed understanding regarding what to do in case of urgent or emergency type symptoms.  Medication list was reconciled, printed and provided to the patient in AVS. Patient instructions and summary information was reviewed with the patient as documented in the AVS. This note was prepared with assistance of Dragon voice recognition software. Occasional wrong-word or sound-a-like substitutions may have occurred due to the inherent limitations of voice recognition software  This visit occurred during the SARS-CoV-2 public health emergency.  Safety protocols were in place, including screening questions prior to the visit, additional usage of staff PPE, and extensive cleaning of exam room while observing appropriate contact time as indicated for disinfecting solutions.

## 2021-03-24 NOTE — Progress Notes (Signed)
Chronic Care Management Pharmacy Note  04/05/2021 Name:  Kelly Barajas MRN:  [id number] DOB:  Jun 26, 1950  Summary: PharmD follow up.  Reports BP has improved since increased lisiopril/HCTZ.  Ranging from 104-125/65-80.  Recommendations/Changes made from today's visit: None continue to monitor BP  Plan: FU 6 months   Subjective: Kelly Barajas is an 70 y.o. year old female who is a primary patient of Leamon Arnt, MD.  The CCM team was consulted for assistance with disease management and care coordination needs.    Engaged with patient by telephone for follow up visit in response to provider referral for pharmacy case management and/or care coordination services.   Consent to Services:  The patient was given the following information about Chronic Care Management services today, agreed to services, and gave verbal consent: 1. CCM service includes personalized support from designated clinical staff supervised by the primary care provider, including individualized plan of care and coordination with other care providers 2. 24/7 contact phone numbers for assistance for urgent and routine care needs. 3. Service will only be billed when office clinical staff spend 20 minutes or more in a month to coordinate care. 4. Only one practitioner may furnish and bill the service in a calendar month. 5.The patient may stop CCM services at any time (effective at the end of the month) by phone call to the office staff. 6. The patient will be responsible for cost sharing (co-pay) of up to 20% of the service fee (after annual deductible is met). Patient agreed to services and consent obtained.  Patient Care Team: Leamon Arnt, MD as PCP - General (Family Medicine) Marybelle Killings, MD as Consulting Physician (Orthopedic Surgery) Obgyn, Windell Hummingbird, Aloha Gell, MD as Consulting Physician (Cardiology) Melina Schools, OD as Consulting Physician (Optometry) Edythe Clarity, [facility]  (Pharmacist)  Recent office visits:  03/08/21 Jonni Sanger) - Zestoretic 20/25 increased dose and monitor BP at home   Recent consult visits:  None   Hospital visits:  None in previous 6 months   Objective:  Lab Results  Component Value Date   CREATININE 0.81 09/01/2020   BUN 19 09/01/2020   GFR 73.26 09/01/2020   GFRNONAA 70 (L) 09/22/2011   GFRAA 81 (L) 09/22/2011   NA 137 09/01/2020   K 3.9 09/01/2020   CALCIUM 10.0 09/01/2020   CO2 28 09/01/2020   GLUCOSE 99 09/01/2020    Lab Results  Component Value Date/Time   GFR 73.26 09/01/2020 09:13 AM   GFR 66.13 08/21/2019 09:23 AM    Last diabetic Eye exam: No results found for: HMDIABEYEEXA  Last diabetic Foot exam: No results found for: HMDIABFOOTEX   Lab Results  Component Value Date   CHOL 195 09/01/2020   HDL 46.00 09/01/2020   LDLCALC 114 (H) 09/01/2020   LDLDIRECT 120.0 08/21/2019   TRIG 175.0 (H) 09/01/2020   CHOLHDL 4 09/01/2020    Hepatic Function Latest Ref Rng & Units 09/01/2020 08/21/2019 09/25/2018  Total Protein 6.0 - 8.3 g/dL 7.1 7.0 6.7  Albumin 3.5 - 5.2 g/dL 4.6 4.4 4.2  AST 0 - 37 U/L 24 34 46(H)  ALT 0 - 35 U/L 23 36(H) 52(H)  Alk Phosphatase 39 - 117 U/L 76 87 86  Total Bilirubin 0.2 - 1.2 mg/dL 0.8 0.6 0.7  Bilirubin, Direct 0.0 - 0.3 mg/dL - - 0.1    Lab Results  Component Value Date/Time   TSH 2.59 09/01/2020 09:13 AM   TSH 2.03 08/21/2019 09:23 AM  FREET4 1.43 08/21/2019 09:23 AM    CBC Latest Ref Rng & Units 09/01/2020 08/21/2019 08/14/2018  WBC 4.0 - 10.5 K/uL 5.9 6.0 6.5  Hemoglobin 12.0 - 15.0 g/dL 14.8 14.7 15.3(H)  Hematocrit 36.0 - 46.0 % 43.1 44.1 45.3  Platelets 150.0 - 400.0 K/uL 256.0 254.0 244.0    Lab Results  Component Value Date/Time   VD25OH 65.65 08/21/2019 09:23 AM    Clinical ASCVD: No  The 10-year ASCVD risk score (Arnett DK, et al., 2019) is: 16.8%   Values used to calculate the score:     Age: 1 years     Sex: Female     Is Non-Hispanic African American:  No     Diabetic: No     Tobacco smoker: No     Systolic Blood Pressure: 341 mmHg     Is BP treated: Yes     HDL Cholesterol: 46 mg/dL     Total Cholesterol: 195 mg/dL    Depression screen [facility] 2/9 09/01/2020 03/24/2019 08/14/2018  Decreased Interest 0 0 0  Down, Depressed, Hopeless 0 0 0  PHQ - 2 Score 0 0 0  Altered sleeping - - -  Tired, decreased energy - - -  Change in appetite - - -  Feeling bad or failure about yourself  - - -  Trouble concentrating - - -  Moving slowly or fidgety/restless - - -  Suicidal thoughts - - -  PHQ-9 Score - - -     Social History   Tobacco Use  Smoking Status Never  Smokeless Tobacco Never   BP Readings from Last 3 Encounters:  03/08/21 140/90  09/01/20 130/82  03/03/20 134/82   Pulse Readings from Last 3 Encounters:  03/08/21 82  09/01/20 76  03/03/20 85   Wt Readings from Last 3 Encounters:  03/08/21 189 lb 9.6 oz (86 kg)  09/01/20 187 lb (84.8 kg)  03/03/20 190 lb 12.8 oz (86.5 kg)   BMI Readings from Last 3 Encounters:  03/08/21 32.54 kg/m  09/01/20 32.10 kg/m  03/03/20 32.75 kg/m    Assessment/Interventions: Review of patient past medical history, allergies, medications, health status, including review of consultants reports, laboratory and other test data, was performed as part of comprehensive evaluation and provision of chronic care management services.   SDOH:  (Social Determinants of Health) assessments and interventions performed: Yes  Financial Resource Strain: Not on file    SDOH Screenings   Alcohol Screen: Not on file  Depression (PHQ2-9): Low Risk    PHQ-2 Score: 0  Financial Resource Strain: Not on file  Food Insecurity: Not on file  Housing: Not on file  Physical Activity: Not on file  Social Connections: Not on file  Stress: Not on file  Tobacco Use: Low Risk    Smoking Tobacco Use: Never   Smokeless Tobacco Use: Never   Passive Exposure: Not on file  Transportation Needs: Not on file    [place]  No Known Allergies  Medications Reviewed Today     Reviewed by Edythe Clarity, [facility] (Pharmacist) on 04/05/21 at [place] List Status: <None>   Medication Order Taking? Sig Documenting Provider Last Dose Status Informant  aspirin EC 81 MG tablet [id number] Yes Take 81 mg by mouth at bedtime. [provider] Taking Active Self  CALCIUM PO [id number] Yes Take by mouth. [provider] Taking Active   Coenzyme Q10 (COQ10) 100 MG CAPS [id number] Yes Take by mouth. [provider] Taking  Active   fish oil-omega-3 fatty acids 1000 MG capsule [id number] Yes Take 1 g by mouth 4 (four) times daily.  [provider] Taking Active Self  lisinopril-hydrochlorothiazide (ZESTORETIC) 20-25 MG tablet [id number] Yes Take 1 tablet by mouth daily. Leamon Arnt, MD Taking Active   LIVALO 4 MG TABS [id number] Yes TAKE 1 TABLET BY MOUTH AT BEDTIME Leamon Arnt, MD Taking Active   magnesium gluconate (MAGONATE) 500 MG tablet [id number] Yes Take 500 mg by mouth 2 (two) times daily. [provider] Taking Active   naproxen sodium (ANAPROX) 220 MG tablet [id number] Yes Take 440 mg by mouth daily as needed. For pain [provider] Taking Active Self  omeprazole (PRILOSEC) 20 MG capsule [id number] Yes Take 20 mg by mouth daily. [provider] Taking Active   SYNTHROID 88 MCG tablet [id number] Yes TAKE 1 TABLET BY MOUTH ONCE DAILY BEFORE BREAKFAST Leamon Arnt, MD Taking Active             Patient Active Problem List   Diagnosis Date Noted   Elevated LFTs 09/01/2020   Osteopenia 08/21/2019   Bilateral primary osteoarthritis of knee 02/19/2019   Obesity (BMI 30-39.9) 09/05/2016   Overactive bladder 03/07/2016   Vitamin D deficiency 03/07/2016   Hypothyroidism 06/01/2014   Gastroesophageal reflux disease without esophagitis 11/27/2013   Essential hypertension 11/27/2013   Mixed hyperlipidemia 10/04/2012    Immunization History   Administered Date(s) Administered   Fluad Quad(high Dose 65+) 02/19/2019, 03/03/2020, 03/08/2021   Influenza, High Dose Seasonal PF 02/12/2018   Janssen (J&J) SARS-COV-2 Vaccination 10/22/2019, 11/11/2019   Pneumococcal Conjugate-13 09/05/2016   Pneumococcal Polysaccharide-23 08/17/2015, 10/23/2017   Tdap 10/18/2011   Zoster Recombinat (Shingrix) 02/28/2018, 08/28/2018    Conditions to be addressed/monitored:  HTN, GERD, Hypothyroidism, HLD  Care Plan : General Pharmacy (Adult)  Updates made by Edythe Clarity, [place] since 04/05/2021 12:00 AM     Problem: HTN, GERD, Hypothyroidism, HLD   Priority: High  Onset Date: 10/04/2021  Note:   Current Barriers:  None at this time  Pharmacist Clinical Goal(s):  Patient will maintain control of BP as evidenced by home monitoring  through collaboration with PharmD and provider.   Interventions: 1:1 collaboration with Leamon Arnt, MD regarding development and update of comprehensive plan of care as evidenced by provider attestation and co-signature Inter-disciplinary care team collaboration (see longitudinal plan of care) Comprehensive medication review performed; medication list updated in electronic medical record  Hypertension (BP goal <130/80) -Controlled -Current treatment: Lisinopril-HCTZ 20/42m daily -Medications previously tried: none noted  -Current home readings: 126/80 is the highest it has been, 104/65 is the lowest -Denies hypotensive/hypertensive symptoms -Educated on BP goals and benefits of medications for prevention of heart attack, stroke and kidney damage; Exercise goal of 150 minutes per week; Importance of home blood pressure monitoring; -Recently increased dose of this med, she reports BP has been much better since she was taking this new dose.  Denies any adverse effects.  BP range has been good, no dizziness associated with the lower BP readings. -Counseled to monitor BP at home a few times per week,  document, and provide log at future appointments -Recommended to continue current medication  Hyperlipidemia: (LDL goal < 100) -Not ideally controlled -Current treatment: Livalo 46mdaily -Medications previously tried: none noted  -Educated on Cholesterol goals;  Benefits of statin for ASCVD risk reduction; Importance of limiting foods high in cholesterol; -Recommended to continue current medication -Recheck lipids, consider statin intensification if  remains elevated or increases  Hypothyroidism (Goal: Maintain TSH) -Controlled -Current treatment  Synthroid 76mg daily -Medications previously tried: none ntoed -Takes brand name Synthroid, discussed not switching between brand/generic  -TSH in normal -Recommended to continue current medication  Patient Goals/Self-Care Activities Patient will:  - take medications as prescribed focus on medication adherence by pill count check blood pressure daily, document, and provide at future appointments  Follow Up Plan: The care management team will reach out to the patient again over the next 180 days.      Goal: Patient Stated        Medication Assistance: None required.  Patient affirms current coverage meets needs.  Compliance/Adherence/Medication fill history: Care Gaps: Patient needs AWV  Star-Rating Drugs: Livalo 478m08/27/22 90ds  [facility]one: [phone]: [phone] We discussed: Benefits of medication synchronization, packaging and delivery as well as enhanced pharmacist oversight with [employer]. Patient decided to: Continue current medication management strategy  Care Plan and Follow Up Patient Decision:  Patient agrees to Care Plan and Follow-up.  Plan: The care management team will reach out to the patient again over the next 180 days.  ChBeverly MilchPharmD Clinical  Pharmacist ([phone]

## 2021-04-04 ENCOUNTER — Ambulatory Visit (INDEPENDENT_AMBULATORY_CARE_PROVIDER_SITE_OTHER): Payer: PPO | Admitting: Pharmacist

## 2021-04-04 DIAGNOSIS — I10 Essential (primary) hypertension: Secondary | ICD-10-CM

## 2021-04-04 DIAGNOSIS — E782 Mixed hyperlipidemia: Secondary | ICD-10-CM

## 2021-04-05 NOTE — Patient Instructions (Addendum)
Visit Information   Goals Addressed             This Visit's Progress    Track and Manage My Blood Pressure-Hypertension       Timeframe:  Long-Range Goal Priority:  High Start Date:   04/05/21                          Expected End Date:  10/04/21                     Follow Up Date 07/06/21    - check blood pressure weekly - choose a place to take my blood pressure (home, clinic or office, retail store)    Why is this important?   You won't feel high blood pressure, but it can still hurt your blood vessels.  High blood pressure can cause heart or kidney problems. It can also cause a stroke.  Making lifestyle changes like losing a little weight or eating less salt will help.  Checking your blood pressure at home and at different times of the day can help to control blood pressure.  If the doctor prescribes medicine remember to take it the way the doctor ordered.  Call the office if you cannot afford the medicine or if there are questions about it.     Notes:        Patient Care Plan: General Pharmacy (Adult)     Problem Identified: HTN, GERD, Hypothyroidism, HLD   Priority: High  Onset Date: 10/04/2021  Note:   Current Barriers:  None at this time  Pharmacist Clinical Goal(s):  Patient will maintain control of BP as evidenced by home monitoring  through collaboration with PharmD and provider.   Interventions: 1:1 collaboration with Willow Ora, MD regarding development and update of comprehensive plan of care as evidenced by provider attestation and co-signature Inter-disciplinary care team collaboration (see longitudinal plan of care) Comprehensive medication review performed; medication list updated in electronic medical record  Hypertension (BP goal <130/80) -Controlled -Current treatment: Lisinopril-HCTZ 20/25mg  daily -Medications previously tried: none noted  -Current home readings: 126/80 is the highest it has been, 104/65 is the lowest -Denies  hypotensive/hypertensive symptoms -Educated on BP goals and benefits of medications for prevention of heart attack, stroke and kidney damage; Exercise goal of 150 minutes per week; Importance of home blood pressure monitoring; -Recently increased dose of this med, she reports BP has been much better since she was taking this new dose.  Denies any adverse effects.  BP range has been good, no dizziness associated with the lower BP readings. -Counseled to monitor BP at home a few times per week, document, and provide log at future appointments -Recommended to continue current medication  Hyperlipidemia: (LDL goal < 100) -Not ideally controlled -Current treatment: Livalo 4mg  daily -Medications previously tried: none noted  -Educated on Cholesterol goals;  Benefits of statin for ASCVD risk reduction; Importance of limiting foods high in cholesterol; -Recommended to continue current medication -Recheck lipids, consider statin intensification if remains elevated or increases  Hypothyroidism (Goal: Maintain TSH) -Controlled -Current treatment  Synthroid daily -Medications previously tried: none ntoed -Takes brand name Synthroid, discussed not switching between brand/generic  -TSH in normal -Recommended to continue current medication  Patient Goals/Self-Care Activities Patient will:  - take medications as prescribed focus on medication adherence by pill count check blood pressure daily, document, and provide at future appointments  Follow Up Plan: The care management team  will reach out to the patient again over the next 180 days.      Goal: Patient Stated        Patient verbalizes understanding of instructions provided today and agrees to view in MyChart.  Telephone follow up appointment with pharmacy team member scheduled for: 6 months  Erroll Luna, [place]  [phone]

## 2021-04-06 ENCOUNTER — Other Ambulatory Visit: Payer: Self-pay | Admitting: Family Medicine

## 2021-04-06 DIAGNOSIS — Z1231 Encounter for screening mammogram for malignant neoplasm of breast: Secondary | ICD-10-CM

## 2021-04-11 DIAGNOSIS — I10 Essential (primary) hypertension: Secondary | ICD-10-CM

## 2021-04-11 DIAGNOSIS — E782 Mixed hyperlipidemia: Secondary | ICD-10-CM

## 2021-04-18 ENCOUNTER — Encounter: Payer: Self-pay | Admitting: Family Medicine

## 2021-04-18 ENCOUNTER — Ambulatory Visit (INDEPENDENT_AMBULATORY_CARE_PROVIDER_SITE_OTHER): Payer: PPO | Admitting: Family Medicine

## 2021-04-18 ENCOUNTER — Other Ambulatory Visit: Payer: Self-pay

## 2021-04-18 ENCOUNTER — Ambulatory Visit (INDEPENDENT_AMBULATORY_CARE_PROVIDER_SITE_OTHER)
Admission: RE | Admit: 2021-04-18 | Discharge: 2021-04-18 | Disposition: A | Discharge status: Home / Self Care | Payer: PPO | Source: Ambulatory Visit | Attending: Family Medicine | Admitting: Family Medicine

## 2021-04-18 VITALS — BP 124/70 | HR 95 | Temp 98.2°F | Wt 187.8 lb

## 2021-04-18 DIAGNOSIS — M79662 Pain in left lower leg: Secondary | ICD-10-CM

## 2021-04-18 DIAGNOSIS — I10 Essential (primary) hypertension: Secondary | ICD-10-CM | POA: Diagnosis not present

## 2021-04-18 DIAGNOSIS — M1712 Unilateral primary osteoarthritis, left knee: Secondary | ICD-10-CM | POA: Diagnosis not present

## 2021-04-18 LAB — BASIC METABOLIC PANEL
BUN: 23 mg/dL (ref 6–23)
CO2: 28 mEq/L (ref 19–32)
Calcium: 10.2 mg/dL (ref 8.4–10.5)
Chloride: 99 mEq/L (ref 96–112)
Creatinine, Ser: 0.94 mg/dL (ref 0.40–1.20)
GFR: 61.01 mL/min (ref >60.00)
Glucose, Bld: 149 mg/dL — ABNORMAL HIGH (ref 70–99)
Potassium: 4.4 mEq/L (ref 3.5–5.1)
Sodium: 136 mEq/L (ref 135–145)

## 2021-04-18 LAB — CK: Total CK: 76 U/L (ref 7–177)

## 2021-04-18 LAB — VITAMIN D 25 HYDROXY (VIT D DEFICIENCY, FRACTURES): VITD: 47.45 ng/mL (ref 30.00–100.00)

## 2021-04-18 NOTE — Patient Instructions (Addendum)
Please follow up as scheduled for your next visit with me: 06/15/2021   Please go to our [facility] office to get your xrays done. You can walk in M-F between 8:30am- noon or 1pm - 5pm. Tell them you are there for xrays ordered by me. They will send me the results, then I will let you know the results with instructions.   Address: [address]  The Xray department is located in the basement.   If you have any questions or concerns, please don't hesitate to send me a message via MyChart or call the office at [phone]. Thank you for visiting with Kelly Barajas today! It's our pleasure caring for you.   I will let you next steps after lab and xray reports return.   Your blood pressure looks better.

## 2021-04-18 NOTE — Progress Notes (Signed)
Subjective  CC:  Chief Complaint  Patient presents with   Leg Pain    Left leg, intermittent pain since September    HPI: Kelly Barajas is a 70 y.o. female who presents to the office today to address the problems listed above in the chief complaint. 70 year old complains of left calf pain.  Started during the summer actually.  Noted mild intermittent pain with exercise class.  She changed to do a recumbent bike and that made it better.  However over the course of the last several months she has noted pain on the outer side of her left lower leg with ambulation, stair climbing and bicycling.  No pain at rest.  However yesterday, pain was so bad it limited her ability to get some projects done.  Needed to take naproxen and Advil for the pain.  She denies swelling of the calf, tenderness, knee or ankle pain.  She is on a statin.  No right-sided symptoms.  Having a little bit of a limp today because of her symptoms. Hypertension follow-up: We adjusted her medication up at last visit.  She feels well.  Tolerating well.  Does not check blood pressures at home.  No chest pain.  Assessment  1. Pain of left calf   2. Essential hypertension      Plan  Pain in left calf: Abnormal knee exam.  Check knee x-ray.  Suspect pain is related to osteoarthritis of knee.  Baker's cyst is also possible.  Check lab work.  Offered steroid injection but patient defers currently.  Naproxen as needed.  May need referral depending on x-ray results.  Low risk for DVT.  Check D-dimer.  Hold statin Hypertension: Now well controlled.  Follow up: Return for as scheduled.  06/15/2021  Orders Placed This Encounter  Procedures   DG Knee Complete 4 Views Left   D-dimer, quantitative   CK (Creatine Kinase)   VITAMIN D 25 Hydroxy (Vit-D Deficiency, Fractures)   Basic metabolic panel   No orders of the defined types were placed in this encounter.     I reviewed the patients updated PMH, FH, and SocHx.     Patient Active Problem List   Diagnosis Date Noted   Hypothyroidism 06/01/2014    Priority: High   Essential hypertension 11/27/2013    Priority: High   Mixed hyperlipidemia 10/04/2012    Priority: High   Osteopenia 08/21/2019    Priority: Medium    Gastroesophageal reflux disease without esophagitis 11/27/2013    Priority: Medium    Bilateral primary osteoarthritis of knee 02/19/2019    Priority: Low   Obesity (BMI 30-39.9) 09/05/2016    Priority: Low   Overactive bladder 03/07/2016    Priority: Low   Vitamin D deficiency 03/07/2016    Priority: Low   Elevated LFTs 09/01/2020   Current Meds  Medication Sig   aspirin EC 81 MG tablet Take 81 mg by mouth at bedtime.   CALCIUM PO Take by mouth.   Coenzyme Q10 (COQ10) 100 MG CAPS Take by mouth.   lisinopril-hydrochlorothiazide (ZESTORETIC) 20-25 MG tablet Take 1 tablet by mouth daily.   LIVALO 4 MG TABS TAKE 1 TABLET BY MOUTH AT BEDTIME   magnesium gluconate (MAGONATE) 500 MG tablet Take 500 mg by mouth 2 (two) times daily.   naproxen sodium (ANAPROX) 220 MG tablet Take 440 mg by mouth daily as needed. For pain   Omega-3 Fatty Acids (FISH OIL PO) Take 2,000 mg by mouth 2 (two) times  daily. 4000mg  total   omeprazole (PRILOSEC) 20 MG capsule Take 20 mg by mouth daily.   SYNTHROID 88 MCG tablet TAKE 1 TABLET BY MOUTH ONCE DAILY BEFORE BREAKFAST    Allergies: Patient has No Known Allergies. Family History: Patient family history includes Arthritis in her mother; CAD in her father and mother; Colon cancer (age of onset: 89) in her maternal grandmother; Depression in her mother; Diabetes in her mother; Diabetes Mellitus II in her mother; Heart attack in her father; Hyperlipidemia in her father and mother; Hypertension in her brother, mother, and sister; Hypothyroidism in her daughter and daughter. Social History:  Patient  reports that she has never smoked. She has never used smokeless tobacco. She reports that she does not drink  alcohol and does not use drugs.  Review of Systems: Constitutional: Negative for fever malaise or anorexia Cardiovascular: negative for chest pain Respiratory: negative for SOB or persistent cough Gastrointestinal: negative for abdominal pain  Objective  Vitals: BP 124/70   Pulse 95   Temp 98.2 F (36.8 C) (Temporal)   Wt 187 lb 12.8 oz (85.2 kg)   SpO2 97%   BMI 32.24 kg/m  General: no acute distress , A&Ox3, antalgic gait with limp Left knee: non tender, no effusion or warmth. Decreased extension.  Calf: non tender, no cords. No masses. No edema Cor: RRR Lungs CTAB    Commons side effects, risks, benefits, and alternatives for medications and treatment plan prescribed today were discussed, and the patient expressed understanding of the given instructions. Patient is instructed to call or message via MyChart if he/she has any questions or concerns regarding our treatment plan. No barriers to understanding were identified. We discussed Red Flag symptoms and signs in detail. Patient expressed understanding regarding what to do in case of urgent or emergency type symptoms.  Medication list was reconciled, printed and provided to the patient in AVS. Patient instructions and summary information was reviewed with the patient as documented in the AVS. This note was prepared with assistance of Dragon voice recognition software. Occasional wrong-word or sound-a-like substitutions may have occurred due to the inherent limitations of voice recognition software  This visit occurred during the SARS-CoV-2 public health emergency.  Safety protocols were in place, including screening questions prior to the visit, additional usage of staff PPE, and extensive cleaning of exam room while observing appropriate contact time as indicated for disinfecting solutions.

## 2021-04-19 LAB — D-DIMER, QUANTITATIVE: D-Dimer, Quant: 0.3 mcg/mL FEU (ref <0.50)

## 2021-04-20 NOTE — Progress Notes (Signed)
Please call patient: I have reviewed his/her lab results. Xray shows worsening medial knee arthritis; I suspect this is the cause ofher pain. Irecommend trial of a steroid injection OR refer to ortho for further evaluation.

## 2021-04-21 ENCOUNTER — Telehealth: Payer: Self-pay

## 2021-04-21 MED ORDER — TRAMADOL HCL 50 MG PO TABS
50.0000 mg | ORAL_TABLET | Freq: Three times a day (TID) | ORAL | 0 refills | Status: AC | PRN
Start: 2021-04-21 — End: 2021-04-26

## 2021-04-21 NOTE — Telephone Encounter (Signed)
I have sent in tramadol to be used for moderate pain that is uncontrolled with nsaids: I recommend using naprosyn 1-2 tabs twice a day with food first.   Thanks

## 2021-04-21 NOTE — Telephone Encounter (Signed)
Patient is requesting a pain medication until she can come in for the steroid injection

## 2021-04-21 NOTE — Telephone Encounter (Signed)
Spoke with patient, gave a verbal understanding °

## 2021-04-21 NOTE — Addendum Note (Signed)
Addended by: Asencion Partridge on: 04/21/2021 12:07 PM   Modules accepted: Orders

## 2021-04-21 NOTE — Progress Notes (Signed)
Unable to LVM for patient, VM is full.

## 2021-04-21 NOTE — Telephone Encounter (Signed)
Error

## 2021-04-30 ENCOUNTER — Other Ambulatory Visit: Payer: Self-pay | Admitting: Family Medicine

## 2021-05-11 ENCOUNTER — Ambulatory Visit
Admission: RE | Admit: 2021-05-11 | Discharge: 2021-05-11 | Disposition: A | Discharge status: Home / Self Care | Payer: PPO | Source: Ambulatory Visit | Attending: Family Medicine | Admitting: Family Medicine

## 2021-05-11 DIAGNOSIS — Z1231 Encounter for screening mammogram for malignant neoplasm of breast: Secondary | ICD-10-CM | POA: Diagnosis not present

## 2021-05-17 ENCOUNTER — Telehealth: Payer: Self-pay | Admitting: Pharmacist

## 2021-05-17 ENCOUNTER — Ambulatory Visit: Payer: PPO | Admitting: Family Medicine

## 2021-05-17 NOTE — Chronic Care Management (AMB) (Signed)
Chronic Care Management Pharmacy Assistant   Name: Lyniah Fujita  MRN: [id number] DOB: [birth year]   Reason for Encounter: Hypertension Adherence Call    Recent office visits:  04/18/2021 [facility] (PCP) Willow Ora, MD; Pain in left calf: Abnormal knee exam.  Check knee x-ray.  Suspect pain is related to osteoarthritis of knee.  Baker's cyst is also possible.  Check lab work.  Offered steroid injection but patient defers currently.  Naproxen as needed.  May need referral depending on x-ray results.  Low risk for DVT.  Check D-dimer.  Hold statin  Recent consult visits:  None  Hospital visits:  None in previous 6 months  Medications: Outpatient Encounter Medications as of 05/17/2021  Medication Sig   aspirin EC 81 MG tablet Take 81 mg by mouth at bedtime.   CALCIUM PO Take by mouth.   Coenzyme Q10 (COQ10) 100 MG CAPS Take by mouth.   lisinopril-hydrochlorothiazide (ZESTORETIC) 20-25 MG tablet Take 1 tablet by mouth daily.   LIVALO 4 MG TABS TAKE 1 TABLET BY MOUTH AT BEDTIME   magnesium gluconate (MAGONATE) 500 MG tablet Take 500 mg by mouth 2 (two) times daily.   naproxen sodium (ANAPROX) 220 MG tablet Take 440 mg by mouth daily as needed. For pain   Omega-3 Fatty Acids (FISH OIL PO) Take 2,000 mg by mouth 2 (two) times daily. 4000mg  total   omeprazole (PRILOSEC) 20 MG capsule Take 20 mg by mouth daily.   SYNTHROID 88 MCG tablet TAKE 1 TABLET BY MOUTH ONCE DAILY BEFORE BREAKFAST   No facility-administered encounter medications on file as of 05/17/2021.   Reviewed chart prior to disease state call. Spoke with patient regarding BP  Recent Office Vitals: BP Readings from Last 3 Encounters:  04/18/21 124/70  03/08/21 140/90  09/01/20 130/82   Pulse Readings from Last 3 Encounters:  04/18/21 95  03/08/21 82  09/01/20 76    Wt Readings from Last 3 Encounters:  04/18/21 187 lb 12.8 oz (85.2 kg)  03/08/21 189 lb 9.6 oz (86 kg)  09/01/20 187 lb (84.8 kg)     Kidney  Function Lab Results  Component Value Date/Time   CREATININE 0.94 04/18/2021 10:56 AM   CREATININE 0.81 09/01/2020 09:13 AM   GFR 61.01 04/18/2021 10:56 AM   GFRNONAA 70 (L) 09/22/2011 05:30 PM   GFRAA 81 (L) 09/22/2011 05:30 PM    BMP Latest Ref Rng & Units 04/18/2021 09/01/2020 08/21/2019  Glucose 70 - 99 mg/dL 10/21/2019) 99 97  BUN 6 - 23 mg/dL 23 19 18   Creatinine 0.40 - 1.20 mg/dL 850(Y 7.74  Sodium 135 - 145 mEq/L 136 137 139  Potassium 3.5 - 5.1 mEq/L 4.4 3.9 4.5  Chloride 96 - 112 mEq/L 99 99 100  CO2 19 - 32 mEq/L 28 28 31   Calcium 8.4 - 10.5 mg/dL 1.28 7.86    Current antihypertensive regimen:  Lisinopril-HCTZ 20-25 mg daily  How often are you checking your Blood Pressure? 1-2x per week  Current home BP readings: 130/75, 113/70, 127/85, 128/88  What recent interventions/DTPs have been made by any provider to improve Blood Pressure control since last CPP Visit: No recent interventions or DTPs.  Any recent hospitalizations or ED visits since last visit with CPP? No  What diet changes have been made to improve Blood Pressure Control?  Pt states she controls her diet with portion control. She eats her largest meal of the day at lunch with a light meal at dinner.  What exercise is being done to improve your Blood Pressure Control?  Pt states she is currently unable to exercise due to knee pain.  Adherence Review: Is the patient currently on ACE/ARB medication? Yes Does the patient have >5 day gap between last estimated fill dates? No   Care Gaps: Medicare Annual Wellness: Due now - pt scheduled 06/30/20 Hemoglobin A1C: n/a Colonoscopy: Next due on 03/26/2025 Dexa Scan: Next due on 03/03/2024 Mammogram:  Next due on 05/11/2022  Future Appointments  Date Time Provider Department Center  05/19/2021  1:45 PM Eldred Manges, MD [facility] None  06/15/2021  8:30 AM Willow Ora, MD [facility]  06/30/2021  9:30 AM [facility] HEALTH COACH [facility]  10/24/2021  3:00  PM [facility] CCM PHARMACIST [facility]     Star Rating Drugs: Lisinopril/HCTZ 12.5 mg/20 mg last filled 04/30/2021 90 DS Livalo 4 mg last filled 05/04/2021 90 DS  April D Calhoun, [facility] Clinical Pharmacist Assistant [phone]

## 2021-05-19 ENCOUNTER — Encounter: Payer: Self-pay | Admitting: Orthopaedic Surgery

## 2021-05-19 ENCOUNTER — Other Ambulatory Visit: Payer: Self-pay

## 2021-05-19 ENCOUNTER — Ambulatory Visit (INDEPENDENT_AMBULATORY_CARE_PROVIDER_SITE_OTHER): Payer: PPO | Admitting: Orthopaedic Surgery

## 2021-05-19 VITALS — Ht 63.0 in | Wt 187.0 lb

## 2021-05-19 DIAGNOSIS — M1712 Unilateral primary osteoarthritis, left knee: Secondary | ICD-10-CM

## 2021-05-19 DIAGNOSIS — M170 Bilateral primary osteoarthritis of knee: Secondary | ICD-10-CM

## 2021-05-19 MED ORDER — LIDOCAINE HCL 1 % IJ SOLN
0.5000 mL | INTRAMUSCULAR | Status: AC | PRN
  Administered 2021-05-19: .5 mL

## 2021-05-19 MED ORDER — BUPIVACAINE HCL 0.5 % IJ SOLN
3.0000 mL | INTRAMUSCULAR | Status: AC | PRN
  Administered 2021-05-19: 3 mL via INTRA_ARTICULAR

## 2021-05-19 MED ORDER — METHYLPREDNISOLONE ACETATE 40 MG/ML IJ SUSP
40.0000 mg | INTRAMUSCULAR | Status: AC | PRN
  Administered 2021-05-19: 40 mg via INTRA_ARTICULAR

## 2021-05-19 NOTE — Progress Notes (Signed)
Office Visit Note   Patient: Kelly Barajas           Date of Birth: 1950/06/19           MRN: [id number] Visit Date: 05/19/2021              Requested by: Willow Ora, MD [address] [place],  [place] [address] PCP: Willow Ora, MD   Assessment & Plan: Visit Diagnoses:  1. Bilateral primary osteoarthritis of knee     Plan: Left knee injection performed.  We will recheck her in 3 months.  We discussed areas where she has bone-on-bone changes with her osteoarthritis moderate to severe.  Follow-Up Instructions: No follow-ups on file.   Orders:  No orders of the defined types were placed in this encounter.  No orders of the defined types were placed in this encounter.     Procedures: Large Joint Inj: L knee on 05/19/2021 3:04 PM Indications: joint swelling and pain Details: 22 G 1.5 in needle, anterolateral approach  Arthrogram: No  Medications: 0.5 mL lidocaine 1 %; 3 mL bupivacaine 0.5 %; 40 mg methylPREDNISolone acetate 40 MG/ML Outcome: tolerated well, no immediate complications Procedure, treatment alternatives, risks and benefits explained, specific risks discussed. Consent was given by the patient. Immediately prior to procedure a time out was called to verify the correct patient, procedure, equipment, support staff and site/side marked as required. Patient was prepped and draped in the usual sterile fashion.      Clinical Data: No additional findings.   Subjective: Chief Complaint  Patient presents with   Left Knee - Pain    HPI 70 year old female seen with left knee pain started during the summer gradually progressing.  She was taking some steps Aleve in the morning 2 tablets now is up to 2 4 tablets still having pain.  Problems going up and down the steps pain bothers her at night she goes to gym rides a recumbent bike.  Problems with ladder steps in her house.  Pain hurts laterally in her knee posterior lateral as well as anteromedial.   She has history of osteopenia on bone density test.  No true locking.  She has aching in her knee and some intermittent swelling.  X-ray showed standing bone-on-bone changes medial compartment and with knee flexed view lateral compartment is bone-on-bone changes.  Review of Systems positive for osteopenia hypothyroidism hypertension.  Patient takes vitamin D supplement.   Objective: Vital Signs: Ht 5\' 3"  (1.6 m)   Wt 187 lb (84.8 kg)   BMI 33.13 kg/m   Physical Exam Constitutional:      Appearance: She is well-developed.  HENT:     Head: Normocephalic.     Right Ear: External ear normal.     Left Ear: External ear normal. There is no impacted cerumen.  Eyes:     Pupils: Pupils are equal, round, and reactive to light.  Neck:     Thyroid: No thyromegaly.     Trachea: No tracheal deviation.  Cardiovascular:     Rate and Rhythm: Normal rate.  Pulmonary:     Effort: Pulmonary effort is normal.  Abdominal:     Palpations: Abdomen is soft.  Musculoskeletal:     Cervical back: No rigidity.  Skin:    General: Skin is warm and dry.  Neurological:     Mental Status: She is alert and oriented to person, place, and time.  Psychiatric:        Behavior: Behavior  normal.    Ortho Exam crepitus knee range of motion she goes up 1 step at a time using her right leg first.  With left knee she does not hop step.  Pain with patellofemoral loading quadriceps contracture she does have full extension good quad strength collateral ligaments are balanced.  Minimal trochanteric bursal tenderness negative logroll the hips no sciatic notch tenderness.  Specialty Comments:  No specialty comments available.  Imaging: Narrative & Impression  CLINICAL DATA:  Left knee pain   EXAM: LEFT KNEE - COMPLETE 4+ VIEW   COMPARISON:  Left knee x-ray 04/04/2016   FINDINGS: No acute fracture or dislocation identified. Bones are osteopenic. Moderate to severe narrowing of the medial compartment joint  space, worsened since previous study, with associated subchondral sclerosis and small osteophytes. Mild narrowing of the patellofemoral joint with marginal osteophytes. Small joint effusion.   IMPRESSION: 1. No acute osseous abnormality identified. 2. Small joint effusion. 3. Degenerative changes, progressed since previous study.     Electronically Signed   By: Jannifer Hick M.D.   On: 04/19/2021 15:06     PMFS History: Patient Active Problem List   Diagnosis Date Noted   Elevated LFTs 09/01/2020   Osteopenia 08/21/2019   Bilateral primary osteoarthritis of knee 02/19/2019   Obesity (BMI 30-39.9) 09/05/2016   Overactive bladder 03/07/2016   Vitamin D deficiency 03/07/2016   Hypothyroidism 06/01/2014   Gastroesophageal reflux disease without esophagitis 11/27/2013   Essential hypertension 11/27/2013   Mixed hyperlipidemia 10/04/2012   Past Medical History:  Diagnosis Date   Arthritis    Essential hypertension 11/27/2013   GERD (gastroesophageal reflux disease)    Hyperlipidemia    Hypothyroidism    Osteopenia 08/21/2019    Family History  Problem Relation Age of Onset   Hyperlipidemia Mother    Diabetes Mellitus II Mother    CAD Mother    Hypertension Mother    Depression Mother    Diabetes Mother    Arthritis Mother    CAD Father    Hyperlipidemia Father    Heart attack Father    Hypertension Sister    Hypertension Brother    Hypothyroidism Daughter    Colon cancer Maternal Grandmother 59   Hypothyroidism Daughter     Past Surgical History:  Procedure Laterality Date   DILATION AND CURETTAGE, DIAGNOSTIC / THERAPEUTIC     KNEE ARTHROSCOPY W/ MENISCAL REPAIR Right 07/16/2017   TONSILLECTOMY     Social History   Occupational History   Occupation: retired Engineer, agricultural  Tobacco Use   Smoking status: Never   Smokeless tobacco: Never  Vaping Use   Vaping Use: Never used  Substance and Sexual Activity   Alcohol use: No   Drug use:  No   Sexual activity: Never    Birth control/protection: Post-menopausal

## 2021-06-15 ENCOUNTER — Encounter: Payer: Self-pay | Admitting: Family Medicine

## 2021-06-15 ENCOUNTER — Ambulatory Visit (INDEPENDENT_AMBULATORY_CARE_PROVIDER_SITE_OTHER): Payer: PPO | Admitting: Family Medicine

## 2021-06-15 ENCOUNTER — Other Ambulatory Visit: Payer: Self-pay

## 2021-06-15 VITALS — BP 112/58 | HR 79 | Temp 98.3°F | Ht 63.0 in | Wt 191.2 lb

## 2021-06-15 DIAGNOSIS — M170 Bilateral primary osteoarthritis of knee: Secondary | ICD-10-CM | POA: Diagnosis not present

## 2021-06-15 DIAGNOSIS — I10 Essential (primary) hypertension: Secondary | ICD-10-CM

## 2021-06-15 DIAGNOSIS — E782 Mixed hyperlipidemia: Secondary | ICD-10-CM | POA: Diagnosis not present

## 2021-06-15 NOTE — Patient Instructions (Signed)
Please return in 3 months for your annual complete physical; please come fasting.   Glad you feel young and great!  Let me know if you have any problems with your blood pressure.   If you have any questions or concerns, please don't hesitate to send me a message via MyChart or call the office at [phone]. Thank you for visiting with [place] today! It's our pleasure caring for you.

## 2021-06-15 NOTE — Progress Notes (Signed)
Subjective  CC:  Chief Complaint  Patient presents with   Hypertension    Patients BP's has been around the 120/60's at home.     HPI: Kelly Barajas is a 71 y.o. female who presents to the office today to address the problems listed above in the chief complaint. Hypertension f/u: Control is good . Pt reports she is doing well. taking medications as instructed, no medication side effects noted, no TIAs, no chest pain on exertion, no dyspnea on exertion, no swelling of ankles. She denies adverse effects from his BP medications. Compliance with medication is good.  Monitoring home blood pressures: Reviewed log. Blood pressures averaging 120s over 60s to 70s.  No lightheadedness, palpitations. Hyperlipidemia tolerating Livalo 4 mg nightly.  No myalgias Reviewed orthopedics recent visit notes for left knee osteoarthritis.  He has bone-on-bone changes when standing.  He had steroid injection.  Good response.  Has follow-up in March.  Still pains and swells if overuse.  had to ice and rest over the holidays.  Likely will need knee replacement in the near future.  Assessment  1. Essential hypertension   2. Mixed hyperlipidemia   3. Bilateral primary osteoarthritis of knee      Plan   Hypertension f/u: BP control is well controlled.  Will monitor for symptoms of low blood pressures.  Continue Zestoretic 20/25 daily. Hyperlipidemia f/u: Tolerating low-dose statin. Bilateral knee osteoarthritis, left worse than right.  Continue ice as needed, Tylenol or Aleve.  Follow-up with Ortho.  Education regarding management of these chronic disease states was given. Management strategies discussed on successive visits include dietary and exercise recommendations, goals of achieving and maintaining IBW, and lifestyle modifications aiming for adequate sleep and minimizing stressors.   Follow up: 3 months for complete physical     BP Readings from Last 3 Encounters:  06/15/21 (!) 112/58   04/18/21 124/70  03/08/21 140/90   Wt Readings from Last 3 Encounters:  06/15/21 191 lb 3.2 oz (86.7 kg)  05/19/21 187 lb (84.8 kg)  04/18/21 187 lb 12.8 oz (85.2 kg)    Lab Results  Component Value Date   CHOL 195 09/01/2020   CHOL 199 08/21/2019   CHOL 186 08/14/2018   Lab Results  Component Value Date   HDL 46.00 09/01/2020   HDL 43.70 08/21/2019   HDL 39.20 08/14/2018   Lab Results  Component Value Date   LDLCALC 114 (H) 09/01/2020   LDLCALC 110 (H) 08/14/2018   Lab Results  Component Value Date   TRIG 175.0 (H) 09/01/2020   TRIG 215.0 (H) 08/21/2019   TRIG 183.0 (H) 08/14/2018   Lab Results  Component Value Date   CHOLHDL 4 09/01/2020   CHOLHDL 5 08/21/2019   CHOLHDL 5 08/14/2018   Lab Results  Component Value Date   LDLDIRECT 120.0 08/21/2019   LDLDIRECT 119.0 08/15/2017   Lab Results  Component Value Date   CREATININE 0.94 04/18/2021   BUN 23 04/18/2021   NA 136 04/18/2021   K 4.4 04/18/2021   CL 99 04/18/2021   CO2 28 04/18/2021    The 10-year ASCVD risk score (Arnett DK, et al., 2019) is: 11.1%   Values used to calculate the score:     Age: 10 years     Sex: Female     Is Non-Hispanic African American: No     Diabetic: No     Tobacco smoker: No     Systolic Blood Pressure: XX123456 mmHg  Is BP treated: Yes     HDL Cholesterol: 46 mg/dL     Total Cholesterol: 195 mg/dL  I reviewed the patients updated PMH, FH, and SocHx.    Patient Active Problem List   Diagnosis Date Noted   Hypothyroidism 06/01/2014    Priority: High   Essential hypertension 11/27/2013    Priority: High   Mixed hyperlipidemia 10/04/2012    Priority: High   Osteopenia 08/21/2019    Priority: Medium    Gastroesophageal reflux disease without esophagitis 11/27/2013    Priority: Medium    Bilateral primary osteoarthritis of knee 02/19/2019    Priority: Low   Obesity (BMI 30-39.9) 09/05/2016    Priority: Low   Overactive bladder 03/07/2016    Priority: Low    Vitamin D deficiency 03/07/2016    Priority: Low   Elevated LFTs 09/01/2020    Allergies: Patient has no known allergies.  Social History: Patient  reports that she has never smoked. She has never used smokeless tobacco. She reports that she does not drink alcohol and does not use drugs.  Current Meds  Medication Sig   aspirin EC 81 MG tablet Take 81 mg by mouth at bedtime.   CALCIUM PO Take by mouth.   Coenzyme Q10 (COQ10) 100 MG CAPS Take by mouth.   lisinopril-hydrochlorothiazide (ZESTORETIC) 20-25 MG tablet Take 1 tablet by mouth daily.   LIVALO 4 MG TABS TAKE 1 TABLET BY MOUTH AT BEDTIME   magnesium gluconate (MAGONATE) 500 MG tablet Take 500 mg by mouth 2 (two) times daily.   naproxen sodium (ANAPROX) 220 MG tablet Take 440 mg by mouth daily as needed. For pain   Omega-3 Fatty Acids (FISH OIL PO) Take 2,000 mg by mouth 2 (two) times daily. 4000mg  total   omeprazole (PRILOSEC) 20 MG capsule Take 20 mg by mouth daily.   SYNTHROID 88 MCG tablet TAKE 1 TABLET BY MOUTH ONCE DAILY BEFORE BREAKFAST    Review of Systems: Cardiovascular: negative for chest pain, palpitations, leg swelling, orthopnea Respiratory: negative for SOB, wheezing or persistent cough Gastrointestinal: negative for abdominal pain Genitourinary: negative for dysuria or gross hematuria  Objective  Vitals: BP (!) 112/58    Pulse 79    Temp 98.3 F (36.8 C) (Temporal)    Ht 5\' 3"  (1.6 m)    Wt 191 lb 3.2 oz (86.7 kg)    SpO2 98%    BMI 33.87 kg/m  General: no acute distress  Psych:  Alert and oriented, normal mood and affect HEENT:  Normocephalic, atraumatic, supple neck  Cardiovascular:  RRR with 2/6 systolic murmur. no edema Respiratory:  Good breath sounds bilaterally, CTAB with normal respiratory effort  Commons side effects, risks, benefits, and alternatives for medications and treatment plan prescribed today were discussed, and the patient expressed understanding of the given instructions. Patient is  instructed to call or message via MyChart if he/she has any questions or concerns regarding our treatment plan. No barriers to understanding were identified. We discussed Red Flag symptoms and signs in detail. Patient expressed understanding regarding what to do in case of urgent or emergency type symptoms.  Medication list was reconciled, printed and provided to the patient in AVS. Patient instructions and summary information was reviewed with the patient as documented in the AVS. This note was prepared with assistance of Dragon voice recognition software. Occasional wrong-word or sound-a-like substitutions may have occurred due to the inherent limitations of voice recognition software  This visit occurred during the SARS-CoV-2 public health  emergency.  Safety protocols were in place, including screening questions prior to the visit, additional usage of staff PPE, and extensive cleaning of exam room while observing appropriate contact time as indicated for disinfecting solutions.

## 2021-06-20 ENCOUNTER — Ambulatory Visit (INDEPENDENT_AMBULATORY_CARE_PROVIDER_SITE_OTHER): Payer: PPO

## 2021-06-20 ENCOUNTER — Other Ambulatory Visit: Payer: Self-pay

## 2021-06-20 DIAGNOSIS — Z0000 Encounter for general adult medical examination without abnormal findings: Secondary | ICD-10-CM | POA: Diagnosis not present

## 2021-06-20 NOTE — Patient Instructions (Signed)
Kelly Barajas , Thank you for taking time to come for your Medicare Wellness Visit. I appreciate your ongoing commitment to your health goals. Please review the following plan we discussed and let me know if I can assist you in the future.   Screening recommendations/referrals: Colonoscopy: Done 03/27/15 repeat 10 years  Mammogram: 05/11/21 repeat every year  Bone Density: Done 03/03/21 repeat every 3 years  Recommended yearly ophthalmology/optometry visit for glaucoma screening and checkup Recommended yearly dental visit for hygiene and checkup  Vaccinations: Influenza vaccine: done 03/08/21 repeat every year Pneumococcal vaccine: Up to date Tdap vaccine: 5//8/13 repeat every 10 year  Shingles vaccine: Completed 3/18 & 02/28/18    Covid-19:Completed 5/12 & 11/11/19  Advanced directives: Advance directive discussed with you today. I have provided a copy for you to complete at home and have notarized. Once this is complete please bring a copy in to our office so we can scan it into your chart.  Conditions/risks identified: exercise more   Next appointment: Follow up in one year for your annual wellness visit    Preventive Care 72 Years and Older, Female Preventive care refers to lifestyle choices and visits with your health care provider that can promote health and wellness. What does preventive care include? A yearly physical exam. This is also called an annual well check. Dental exams once or twice a year. Routine eye exams. Ask your health care provider how often you should have your eyes checked. Personal lifestyle choices, including: Daily care of your teeth and gums. Regular physical activity. Eating a healthy diet. Avoiding tobacco and drug use. Limiting alcohol use. Practicing safe sex. Taking low-dose aspirin every day. Taking vitamin and mineral supplements as recommended by your health care provider. What happens during an annual well check? The services and screenings  done by your health care provider during your annual well check will depend on your age, overall health, lifestyle risk factors, and family history of disease. Counseling  Your health care provider may ask you questions about your: Alcohol use. Tobacco use. Drug use. Emotional well-being. Home and relationship well-being. Sexual activity. Eating habits. History of falls. Memory and ability to understand (cognition). Work and work Astronomer. Reproductive health. Screening  You may have the following tests or measurements: Height, weight, and BMI. Blood pressure. Lipid and cholesterol levels. These may be checked every 5 years, or more frequently if you are over 58 years old. Skin check. Lung cancer screening. You may have this screening every year starting at age 37 if you have a 30-pack-year history of smoking and currently smoke or have quit within the past 15 years. Fecal occult blood test (FOBT) of the stool. You may have this test every year starting at age 66. Flexible sigmoidoscopy or colonoscopy. You may have a sigmoidoscopy every 5 years or a colonoscopy every 10 years starting at age 16. Hepatitis C blood test. Hepatitis B blood test. Sexually transmitted disease (STD) testing. Diabetes screening. This is done by checking your blood sugar (glucose) after you have not eaten for a while (fasting). You may have this done every 1-3 years. Bone density scan. This is done to screen for osteoporosis. You may have this done starting at age 46. Mammogram. This may be done every 1-2 years. Talk to your health care provider about how often you should have regular mammograms. Talk with your health care provider about your test results, treatment options, and if necessary, the need for more tests. Vaccines  Your health care provider  may recommend certain vaccines, such as: Influenza vaccine. This is recommended every year. Tetanus, diphtheria, and acellular pertussis (Tdap, Td)  vaccine. You may need a Td booster every 10 years. Zoster vaccine. You may need this after age 72. Pneumococcal 13-valent conjugate (PCV13) vaccine. One dose is recommended after age 49. Pneumococcal polysaccharide (PPSV23) vaccine. One dose is recommended after age 42. Talk to your health care provider about which screenings and vaccines you need and how often you need them. This information is not intended to replace advice given to you by your health care provider. Make sure you discuss any questions you have with your health care provider. Document Released: 06/25/2015 Document Revised: 02/16/2016 Document Reviewed: 03/30/2015 Elsevier Interactive Patient Education  2017 [place] Prevention in the Home Falls can cause injuries. They can happen to people of all ages. There are many things you can do to make your home safe and to help prevent falls. What can I do on the outside of my home? Regularly fix the edges of walkways and driveways and fix any cracks. Remove anything that might make you trip as you walk through a door, such as a raised step or threshold. Trim any bushes or trees on the path to your home. Use bright outdoor lighting. Clear any walking paths of anything that might make someone trip, such as rocks or tools. Regularly check to see if handrails are loose or broken. Make sure that both sides of any steps have handrails. Any raised decks and porches should have guardrails on the edges. Have any leaves, snow, or ice cleared regularly. Use sand or salt on walking paths during winter. Clean up any spills in your garage right away. This includes oil or grease spills. What can I do in the bathroom? Use night lights. Install grab bars by the toilet and in the tub and shower. Do not use towel bars as grab bars. Use non-skid mats or decals in the tub or shower. If you need to sit down in the shower, use a plastic, non-slip stool. Keep the floor dry. Clean up any  water that spills on the floor as soon as it happens. Remove soap buildup in the tub or shower regularly. Attach bath mats securely with double-sided non-slip rug tape. Do not have throw rugs and other things on the floor that can make you trip. What can I do in the bedroom? Use night lights. Make sure that you have a light by your bed that is easy to reach. Do not use any sheets or blankets that are too big for your bed. They should not hang down onto the floor. Have a firm chair that has side arms. You can use this for support while you get dressed. Do not have throw rugs and other things on the floor that can make you trip. What can I do in the kitchen? Clean up any spills right away. Avoid walking on wet floors. Keep items that you use a lot in easy-to-reach places. If you need to reach something above you, use a strong step stool that has a grab bar. Keep electrical cords out of the way. Do not use floor polish or wax that makes floors slippery. If you must use wax, use non-skid floor wax. Do not have throw rugs and other things on the floor that can make you trip. What can I do with my stairs? Do not leave any items on the stairs. Make sure that there are handrails on both sides  of the stairs and use them. Fix handrails that are broken or loose. Make sure that handrails are as long as the stairways. Check any carpeting to make sure that it is firmly attached to the stairs. Fix any carpet that is loose or worn. Avoid having throw rugs at the top or bottom of the stairs. If you do have throw rugs, attach them to the floor with carpet tape. Make sure that you have a light switch at the top of the stairs and the bottom of the stairs. If you do not have them, ask someone to add them for you. What else can I do to help prevent falls? Wear shoes that: Do not have high heels. Have rubber bottoms. Are comfortable and fit you well. Are closed at the toe. Do not wear sandals. If you use a  stepladder: Make sure that it is fully opened. Do not climb a closed stepladder. Make sure that both sides of the stepladder are locked into place. Ask someone to hold it for you, if possible. Clearly mark and make sure that you can see: Any grab bars or handrails. First and last steps. Where the edge of each step is. Use tools that help you move around (mobility aids) if they are needed. These include: Canes. Walkers. Scooters. Crutches. Turn on the lights when you go into a dark area. Replace any light bulbs as soon as they burn out. Set up your furniture so you have a clear path. Avoid moving your furniture around. If any of your floors are uneven, fix them. If there are any pets around you, be aware of where they are. Review your medicines with your doctor. Some medicines can make you feel dizzy. This can increase your chance of falling. Ask your doctor what other things that you can do to help prevent falls. This information is not intended to replace advice given to you by your health care provider. Make sure you discuss any questions you have with your health care provider. Document Released: 03/25/2009 Document Revised: 11/04/2015 Document Reviewed: 07/03/2014 Elsevier Interactive Patient Education  2017 [employer].

## 2021-06-20 NOTE — Progress Notes (Addendum)
Virtual Visit via Telephone Note  I connected with  Kelly Barajas on 06/20/21 at 11:45 AM EST by telephone and verified that I am speaking with the correct person using two identifiers.  Medicare Annual Wellness visit completed telephonically due to Covid-19 pandemic.   Persons participating in this call: This Health Coach and this patient.   Location: Patient: Home Provider: Office   I discussed the limitations, risks, security and privacy concerns of performing an evaluation and management service by telephone and the availability of in person appointments. The patient expressed understanding and agreed to proceed.  Unable to perform video visit due to video visit attempted and failed and/or patient does not have video capability.   Some vital signs may be absent or patient reported.   Willette Brace, LPN   Subjective:   Kelly Barajas is a 71 y.o. female who presents for Medicare Annual (Subsequent) preventive examination.  Review of Systems     Cardiac Risk Factors include: advanced age (>73men, >93 women);hypertension;dyslipidemia;obesity (BMI >30kg/m2)     Objective:    There were no vitals filed for this visit. There is no height or weight on file to calculate BMI.  Advanced Directives 06/20/2021 03/24/2019 02/21/2018 04/04/2016  Does Patient Have a Medical Advance Directive? No No No No  Would patient like information on creating a medical advance directive? Yes (MAU/Ambulatory/Procedural Areas - Information given) Yes (MAU/Ambulatory/Procedural Areas - Information given) Yes (MAU/Ambulatory/Procedural Areas - Information given) -    Current Medications (verified) Outpatient Encounter Medications as of 06/20/2021  Medication Sig   aspirin EC 81 MG tablet Take 81 mg by mouth at bedtime.   CALCIUM PO Take by mouth.   Coenzyme Q10 (COQ10) 100 MG CAPS Take by mouth.   lisinopril-hydrochlorothiazide (ZESTORETIC) 20-25 MG tablet Take 1 tablet by mouth  daily.   LIVALO 4 MG TABS TAKE 1 TABLET BY MOUTH AT BEDTIME   magnesium gluconate (MAGONATE) 500 MG tablet Take 500 mg by mouth 2 (two) times daily.   naproxen sodium (ANAPROX) 220 MG tablet Take 440 mg by mouth daily as needed. For pain   Omega-3 Fatty Acids (FISH OIL PO) Take 2,000 mg by mouth 2 (two) times daily. 4000mg  total   omeprazole (PRILOSEC) 20 MG capsule Take 20 mg by mouth daily.   SYNTHROID 88 MCG tablet TAKE 1 TABLET BY MOUTH ONCE DAILY BEFORE BREAKFAST   No facility-administered encounter medications on file as of 06/20/2021.    Allergies (verified) Patient has no known allergies.   History: Past Medical History:  Diagnosis Date   Arthritis    Essential hypertension 11/27/2013   GERD (gastroesophageal reflux disease)    Hyperlipidemia    Hypothyroidism    Osteopenia 08/21/2019   Past Surgical History:  Procedure Laterality Date   DILATION AND CURETTAGE, DIAGNOSTIC / THERAPEUTIC     KNEE ARTHROSCOPY W/ MENISCAL REPAIR Right 07/16/2017   TONSILLECTOMY     Family History  Problem Relation Age of Onset   Hyperlipidemia Mother    Diabetes Mellitus II Mother    CAD Mother    Hypertension Mother    Depression Mother    Diabetes Mother    Arthritis Mother    CAD Father    Hyperlipidemia Father    Heart attack Father    Hypertension Sister    Hypertension Brother    Hypothyroidism Daughter    Colon cancer Maternal Grandmother 14   Hypothyroidism Daughter    Social History   Socioeconomic History  Marital status: Divorced    Spouse name: Not on file   Number of children: 2   Years of education: Not on file   Highest education level: Not on file  Occupational History   Occupation: retired Engineer, water  Tobacco Use   Smoking status: Never   Smokeless tobacco: Never  Vaping Use   Vaping Use: Never used  Substance and Sexual Activity   Alcohol use: No   Drug use: No   Sexual activity: Never    Birth control/protection:  Post-menopausal  Other Topics Concern   Not on file  Social History Narrative   Not on file   Social Determinants of Health   Financial Resource Strain: Low Risk    Difficulty of Paying Living Expenses: Not hard at all  Food Insecurity: No Food Insecurity   Worried About Charity fundraiser in the Last Year: Never true   [place] in the Last Year: Never true  Transportation Needs: No Transportation Needs   Lack of Transportation (Medical): No   Lack of Transportation (Non-Medical): No  Physical Activity: Insufficiently Active   Days of Exercise per Week: 4 days   Minutes of Exercise per Session: 30 min  Stress: No Stress Concern Present   Feeling of Stress : Not at all  Social Connections: Moderately Isolated   Frequency of Communication with Friends and Family: More than three times a week   Frequency of Social Gatherings with Friends and Family: More than three times a week   Attends Religious Services: More than 4 times per year   Active Member of [employer] or Organizations: No   Attends Music therapist: Never   Marital Status: Divorced    Tobacco Counseling Counseling given: Not Answered   Clinical Intake:  Pre-visit preparation completed: Yes  Pain : No/denies pain     BMI - recorded: 33.87 Nutritional Status: BMI > 30  Obese Nutritional Risks: None Diabetes: No  How often do you need to have someone help you when you read instructions, pamphlets, or other written materials from your doctor or pharmacy?: 1 - Never  Diabetic?no  Interpreter Needed?: No  Information entered by :: Charlott Rakes, LPN   Activities of Daily Living In your present state of health, do you have any difficulty performing the following activities: 06/20/2021 09/01/2020  Hearing? N N  Vision? N N  Difficulty concentrating or making decisions? N N  Walking or climbing stairs? N N  Dressing or bathing? N N  Doing errands, shopping? N N  Preparing Food and eating ?  N -  Using the Toilet? N -  In the past six months, have you accidently leaked urine? N -  Do you have problems with loss of bowel control? N -  Managing your Medications? N -  Managing your Finances? N -  Housekeeping or managing your Housekeeping? N -  Some recent data might be hidden    Patient Care Team: Leamon Arnt, MD as PCP - General (Family Medicine) Marybelle Killings, MD as Consulting Physician (Orthopedic Surgery) Obgyn, Windell Hummingbird, Aloha Gell, MD as Consulting Physician (Cardiology) Melina Schools, OD as Consulting Physician (Optometry) Edythe Clarity, [facility] (Pharmacist)  Indicate any recent Medical Services you may have received from other than [facility] providers in the past year (date may be approximate).     Assessment:   This is a routine wellness examination for [place].  Hearing/Vision screen Hearing Screening - Comments:: Pt denies  any hearing issues  Vision Screening - Comments:: Pt follows up with my eye Dr for annual eye exams   Dietary issues and exercise activities discussed: Current Exercise Habits: Home exercise routine, Type of exercise: Other - see comments (stationary bike), Time (Minutes): 30, Frequency (Times/Week): 4, Weekly Exercise (Minutes/Week): 120   Goals Addressed             This Visit's Progress    Patient Stated       Exercise more        Depression Screen PHQ 2/9 Scores 06/20/2021 09/01/2020 03/24/2019 08/14/2018 02/21/2018 02/12/2018 08/15/2017  PHQ - 2 Score 0 0 0 0 0 0 0  PHQ- 9 Score - - - - - - 0    Fall Risk Fall Risk  06/20/2021 09/01/2020 08/21/2019 03/24/2019 08/14/2018  Falls in the past year? 0 0 0 0 0  Number falls in past yr: 0 0 - - 0  Injury with Fall? 0 0 - 0 0  Follow up Falls prevention discussed - Falls evaluation completed Falls evaluation completed;Education provided;Falls prevention discussed Falls evaluation completed    FALL RISK PREVENTION PERTAINING TO THE HOME:  Any stairs in or around the home? Yes  If  so, are there any without handrails? No  Home free of loose throw rugs in walkways, pet beds, electrical cords, etc? Yes  Adequate lighting in your home to reduce risk of falls? Yes   ASSISTIVE DEVICES UTILIZED TO PREVENT FALLS:  Life alert? No  Use of a cane, walker or w/c? No  Grab bars in the bathroom? No  Shower chair or bench in shower? yes Elevated toilet seat or a handicapped toilet? No   TIMED UP AND GO:  Was the test performed? No .   Cognitive Function: MMSE - Mini Mental State Exam 03/24/2019 02/21/2018  Orientation to time 5 5  Orientation to Place 5 5  Registration 3 3  Attention/ Calculation 5 5  Recall 3 3  Language- name 2 objects 2 2  Language- repeat 1 1  Language- follow 3 step command 3 3  Language- read & follow direction 1 1  Write a sentence 1 1  Copy design 1 1  Total score 30 30     6CIT Screen 06/20/2021  What Year? 0 points  What month? 0 points  What time? 0 points  Count back from 20 0 points  Months in reverse 0 points  Repeat phrase 0 points  Total Score 0    Immunizations Immunization History  Administered Date(s) Administered   Fluad Quad(high Dose 65+) 02/19/2019, 03/03/2020, 03/08/2021   Influenza, High Dose Seasonal PF 02/12/2018   Janssen (J&J) SARS-COV-2 Vaccination 10/22/2019, 11/11/2019   Pneumococcal Conjugate-13 09/05/2016   Pneumococcal Polysaccharide-23 08/17/2015, 10/23/2017   Tdap 10/18/2011   Zoster Recombinat (Shingrix) 02/28/2018, 08/28/2018    TDAP status: Up to date  Flu Vaccine status: Up to date  Pneumococcal vaccine status: Up to date  Covid-19 vaccine status: Completed vaccines  Qualifies for Shingles Vaccine? Yes   Zostavax completed Yes   Shingrix Completed?: Yes  Screening Tests Health Maintenance  Topic Date Due   COVID-19 Vaccine (3 - Booster for Janssen series) 07/07/2021 (Originally 01/06/2020)   TETANUS/TDAP  10/17/2021   MAMMOGRAM  05/11/2022   DEXA SCAN  03/03/2024   COLONOSCOPY (Pts  45-65yrs Insurance coverage will need to be confirmed)  03/26/2025   Pneumonia Vaccine 42+ Years old  Completed   INFLUENZA VACCINE  Completed   Hepatitis C  Screening  Completed   Zoster Vaccines- Shingrix  Completed   HPV VACCINES  Aged Out    Health Maintenance  There are no preventive care reminders to display for this patient.  Colorectal cancer screening: Type of screening: Colonoscopy. Completed 03/27/15. Repeat every 10 years  Mammogram status: Completed 05/11/21. Repeat every year  Bone Density status: Completed 03/03/21. Results reflect: Bone density results: OSTEOPENIA. Repeat every 3 years.   Additional Screening:  Hepatitis C Screening:  Completed 08/15/17  Vision Screening: Recommended annual ophthalmology exams for early detection of glaucoma and other disorders of the eye. Is the patient up to date with their annual eye exam?  Yes  Who is the provider or what is the name of the office in which the patient attends annual eye exams? [facility]  If pt is not established with a provider, would they like to be referred to a provider to establish care? No .   Dental Screening: Recommended annual dental exams for proper oral hygiene  Community Resource Referral / Chronic Care Management: CRR required this visit?  No   CCM required this visit?  No      Plan:     I have personally reviewed and noted the following in the patients chart:   Medical and social history Use of alcohol, tobacco or illicit drugs  Current medications and supplements including opioid prescriptions.  Functional ability and status Nutritional status Physical activity Advanced directives List of other physicians Hospitalizations, surgeries, and ER visits in previous 12 months Vitals Screenings to include cognitive, depression, and falls Referrals and appointments  In addition, I have reviewed and discussed with patient certain preventive protocols, quality metrics, and best practice  recommendations. A written personalized care plan for preventive services as well as general preventive health recommendations were provided to patient.     Willette Brace, LPN   [id number]   Nurse Notes: none

## 2021-06-30 ENCOUNTER — Ambulatory Visit: Payer: PPO

## 2021-07-29 ENCOUNTER — Other Ambulatory Visit: Payer: Self-pay | Admitting: Family Medicine

## 2021-07-30 ENCOUNTER — Other Ambulatory Visit: Payer: Self-pay | Admitting: Family Medicine

## 2021-08-11 ENCOUNTER — Encounter: Payer: Self-pay | Admitting: Orthopaedic Surgery

## 2021-08-11 ENCOUNTER — Other Ambulatory Visit: Payer: Self-pay

## 2021-08-11 ENCOUNTER — Ambulatory Visit (INDEPENDENT_AMBULATORY_CARE_PROVIDER_SITE_OTHER): Payer: PPO | Admitting: Orthopaedic Surgery

## 2021-08-11 VITALS — Ht 62.0 in | Wt 190.0 lb

## 2021-08-11 DIAGNOSIS — M170 Bilateral primary osteoarthritis of knee: Secondary | ICD-10-CM

## 2021-08-11 NOTE — Progress Notes (Signed)
? ?Office Visit Note ?  ?Patient: Kelly Barajas           ?Date of Birth: [birth year]           ?MRN: [id number] ?Visit Date: 08/11/2021 ?             ?Requested by: Leamon Arnt, MD ?[address] ?[place],  [place] [id number] ?PCP: Leamon Arnt, MD ? ? ?Assessment & Plan: ?Visit Diagnoses:  ?1. Bilateral primary osteoarthritis of knee   ? ? ?Plan: Patient would like to proceed with left total knee arthroplasty at the end of March.  Likely eventually need right total knee arthroplasty at some point and when she comes in for preop visit with Benjiman Core, PA-C she can get her right knee injected so we will give her less trouble when she rehabs her left knee. ? ?Procedure discussed risk surgery discussed.  Anesthesia postop therapy ,home therapy, preoperative block Exparel Marcaine discussed in detail questions elicited and answered she requests we proceed. ? ?Follow-Up Instructions: No follow-ups on file.  ? ?Orders:  ?No orders of the defined types were placed in this encounter. ? ?No orders of the defined types were placed in this encounter. ? ? ? ? Procedures: ?No procedures performed ? ? ?Clinical Data: ?No additional findings. ? ? ?Subjective: ?Chief Complaint  ?Patient presents with  ? Right Knee - Pain, Follow-up  ? Left Knee - Follow-up, Pain  ? ? ?HPI 71 year old female with history of bilateral knee osteoarthritis with knee flexion contracture previous injection 3 months ago left knee with some improvement.  Both knees are bothering her she is taking Aleve 2 p.o. twice daily using Bengay, heat, Biofreeze.  She was at a funeral had a stand 30 minutes states pain was excruciating and she finds that she has to sit down does not go places at times due to increased pain.  She has had problems with hypothyroidism hypertension and osteopenia.  Patient states the pain is gotten severe enough she is ready to proceed with total knee arthroplasty, left knee.  Patient lives alone but has a daughter and  stay to come stay with her after surgery.  Patient has bone-on-bone changes medial compartment with subchondral sclerosis and tricompartmental osteophytes. ? ?Review of Systems negative for cardiac or anesthetic problems.  Past history of hypothyroidism. ? ? ?Objective: ?Vital Signs: Ht 5\' 2"  (1.575 m)   Wt 190 lb (86.2 kg)   BMI 34.75 kg/m?  ? ?Physical Exam ?Constitutional:   ?   Appearance: She is well-developed.  ?HENT:  ?   Head: Normocephalic.  ?   Right Ear: External ear normal.  ?   Left Ear: External ear normal. There is no impacted cerumen.  ?Eyes:  ?   Pupils: Pupils are equal, round, and reactive to light.  ?Neck:  ?   Thyroid: No thyromegaly.  ?   Trachea: No tracheal deviation.  ?Cardiovascular:  ?   Rate and Rhythm: Normal rate.  ?Pulmonary:  ?   Effort: Pulmonary effort is normal.  ?Abdominal:  ?   Palpations: Abdomen is soft.  ?Musculoskeletal:  ?   Cervical back: No rigidity.  ?Skin: ?   General: Skin is warm and dry.  ?Neurological:  ?   Mental Status: She is alert and oriented to person, place, and time.  ?Psychiatric:     ?   Behavior: Behavior normal.  ? ? ?Ortho Exam crepitus with knee range of motion she lacks 10 degrees full extension  both right and left knee.  Negative logroll hips.  Reflexes are 2+ and symmetrical.  Distal pulses palpable.  No sciatic notch tenderness. ? ?Specialty Comments:  ?No specialty comments available. ? ?Imaging: ?Narrative & Impression  ?CLINICAL DATA:  Left knee pain ?  ?EXAM: ?LEFT KNEE - COMPLETE 4+ VIEW ?  ?COMPARISON:  Left knee x-ray 04/04/2016 ?  ?FINDINGS: ?No acute fracture or dislocation identified. Bones are osteopenic. ?Moderate to severe narrowing of the medial compartment joint space, ?worsened since previous study, with associated subchondral sclerosis ?and small osteophytes. Mild narrowing of the patellofemoral joint ?with marginal osteophytes. Small joint effusion. ?  ?IMPRESSION: ?1. No acute osseous abnormality identified. ?2. Small joint  effusion. ?3. Degenerative changes, progressed since previous study. ?  ?  ?Electronically Signed ?  By: Ofilia Neas M.D. ?  On: 04/19/2021 15:06 ?   ? ? ? ?PMFS History: ?Patient Active Problem List  ? Diagnosis Date Noted  ? Elevated LFTs 09/01/2020  ? Osteopenia 08/21/2019  ? Bilateral primary osteoarthritis of knee 02/19/2019  ? Obesity (BMI 30-39.9) 09/05/2016  ? Overactive bladder 03/07/2016  ? Vitamin D deficiency 03/07/2016  ? Hypothyroidism 06/01/2014  ? Gastroesophageal reflux disease without esophagitis 11/27/2013  ? Essential hypertension 11/27/2013  ? Mixed hyperlipidemia 10/04/2012  ? ?Past Medical History:  ?Diagnosis Date  ? Arthritis   ? Essential hypertension 11/27/2013  ? GERD (gastroesophageal reflux disease)   ? Hyperlipidemia   ? Hypothyroidism   ? Osteopenia 08/21/2019  ?  ?Family History  ?Problem Relation Age of Onset  ? Hyperlipidemia Mother   ? Diabetes Mellitus II Mother   ? CAD Mother   ? Hypertension Mother   ? Depression Mother   ? Diabetes Mother   ? Arthritis Mother   ? CAD Father   ? Hyperlipidemia Father   ? Heart attack Father   ? Hypertension Sister   ? Hypertension Brother   ? Hypothyroidism Daughter   ? Colon cancer Maternal Grandmother 72  ? Hypothyroidism Daughter   ?  ?Past Surgical History:  ?Procedure Laterality Date  ? DILATION AND CURETTAGE, DIAGNOSTIC / THERAPEUTIC    ? KNEE ARTHROSCOPY W/ MENISCAL REPAIR Right 07/16/2017  ? TONSILLECTOMY    ? ?Social History  ? ?Occupational History  ? Occupation: retired Engineer, water  ?Tobacco Use  ? Smoking status: Never  ? Smokeless tobacco: Never  ?Vaping Use  ? Vaping Use: Never used  ?Substance and Sexual Activity  ? Alcohol use: No  ? Drug use: No  ? Sexual activity: Never  ?  Birth control/protection: Post-menopausal  ? ? ? ? ? ? ?

## 2021-09-01 ENCOUNTER — Ambulatory Visit (INDEPENDENT_AMBULATORY_CARE_PROVIDER_SITE_OTHER): Payer: PPO | Admitting: Surgery

## 2021-09-01 ENCOUNTER — Encounter: Payer: Self-pay | Admitting: Surgery

## 2021-09-01 ENCOUNTER — Other Ambulatory Visit: Payer: Self-pay

## 2021-09-01 VITALS — BP 130/84 | HR 96 | Ht 62.0 in | Wt 190.0 lb

## 2021-09-01 DIAGNOSIS — M170 Bilateral primary osteoarthritis of knee: Secondary | ICD-10-CM

## 2021-09-01 NOTE — Progress Notes (Signed)
71 year old white female with history of DJD left knee and chronic pain comes in for preop evaluation.  States that left knee symptoms unchanged from previous visit and she is wanting to proceed with left total knee arthroplasty as scheduled.  Today history and physical performed.  Review of systems negative.  ? ? ?Plan ?Surgical procedure discussed in detail along with potential rehab/recovery time.  Patient also has DJD right knee.  I will add right knee intra-articular Marcaine/Depo-Medrol injection under anesthesia to her permit. ?

## 2021-09-06 ENCOUNTER — Other Ambulatory Visit: Payer: Self-pay

## 2021-09-09 NOTE — Pre-Procedure Instructions (Signed)
Surgical Instructions ? ? ? Your procedure is scheduled on Wednesday 09/14/21. ? ? Report to [facility] Main Entrance "A" at 10:35 A.M. A.M., then check in with the Admitting office. ? Call this number if you have problems the morning of surgery: ? [phone] ? ? If you have any questions prior to your surgery date call [phone]: Open Monday-Friday 8am-4pm ? ? ? Remember: ? Do not eat after midnight the night before your surgery ? ?You may drink clear liquids until 09:35 A.M. the morning of your surgery.   ?Clear liquids allowed are: Water, Non-Citrus Juices (without pulp), Carbonated Beverages, Clear Tea, Black Coffee ONLY (NO MILK, CREAM OR POWDERED CREAMER of any kind), and Gatorade ?  ?Patient Instructions ? ?The night before surgery:  ?No food after midnight. ONLY clear liquids after midnight ? ?The day of surgery (if you do NOT have diabetes):  ?Drink ONE (1) Pre-Surgery Clear Ensure by 09:35 A.M. the morning of surgery. Drink in one sitting. Do not sip.  ?This drink was given to you during your hospital  ?pre-op appointment visit. ? ?Nothing else to drink after completing the  ?Pre-Surgery Clear Ensure. ? ?       If you have questions, please contact your surgeon?s office. ? ? Take these medicines the morning of surgery with A SIP OF WATER:  ? omeprazole (PRILOSEC)  ? SYNTHROID ? ?As of today, STOP taking any Aspirin (unless otherwise instructed by your surgeon) Aleve, Naproxen, Ibuprofen, Motrin, Advil, Goody's, BC's, all herbal medications, fish oil, and all vitamins. ? ?         ?Do not wear jewelry or makeup ?Do not wear lotions, powders, perfumes/colognes, or deodorant. ?Do not shave 48 hours prior to surgery.  Men may shave face and neck. ?Do not bring valuables to the hospital. ?Do not wear nail polish, gel polish, artificial nails, or any other type of covering on natural nails (fingers and toes) ?If you have artificial nails or gel coating that need to be removed by a nail salon, please have this  removed prior to surgery. Artificial nails or gel coating may interfere with anesthesia's ability to adequately monitor your vital signs. ? ?[facility] is not responsible for any belongings or valuables. .  ? ?Do NOT Smoke (Tobacco/Vaping)  24 hours prior to your procedure ? ?If you use a CPAP at night, you may bring your mask for your overnight stay. ?  ?Contacts, glasses, hearing aids, dentures or partials may not be worn into surgery, please bring cases for these belongings ?  ?For patients admitted to the hospital, discharge time will be determined by your treatment team. ?  ?Patients discharged the day of surgery will not be allowed to drive home, and someone needs to stay with them for 24 hours. ? ? ?SURGICAL WAITING ROOM VISITATION ?Patients having surgery or a procedure in a hospital may have two support people. ?Children under the age of 47 must have an adult with them who is not the patient. ?They may stay in the waiting area during the procedure and may switch out with other visitors. If the patient needs to stay at the hospital during part of their recovery, the visitor guidelines for inpatient rooms apply. ? ?Please refer to the [facility] website for the visitor guidelines for Inpatients (after your surgery is over and you are in a regular room).  ? ? ? ? ? ?Special instructions:   ? ?Oral Hygiene is also important to reduce your risk of infection.  Remember - BRUSH YOUR TEETH THE MORNING OF SURGERY WITH YOUR REGULAR TOOTHPASTE ? ? ?[facility]- Preparing For Surgery ? ?Before surgery, you can play an important role. Because skin is not sterile, your skin needs to be as free of germs as possible. You can reduce the number of germs on your skin by washing with CHG (chlorahexidine gluconate) Soap before surgery.  CHG is an antiseptic cleaner which kills germs and bonds with the skin to continue killing germs even after washing.   ? ? ?Please do not use if you have an allergy to CHG or antibacterial  soaps. If your skin becomes reddened/irritated stop using the CHG.  ?Do not shave (including legs and underarms) for at least 48 hours prior to first CHG shower. It is OK to shave your face. ? ?Please follow these instructions carefully. ?  ? ? Shower the NIGHT BEFORE SURGERY and the MORNING OF SURGERY with CHG Soap.  ? If you chose to wash your hair, wash your hair first as usual with your normal shampoo. After you shampoo, rinse your hair and body thoroughly to remove the shampoo.  Then [employer] and genitals (private parts) with your normal soap and rinse thoroughly to remove soap. ? ?After that Use CHG Soap as you would any other liquid soap. You can apply CHG directly to the skin and wash gently with a scrungie or a clean washcloth.  ? ?Apply the CHG Soap to your body ONLY FROM THE NECK DOWN.  Do not use on open wounds or open sores. Avoid contact with your eyes, ears, mouth and genitals (private parts). Wash Face and genitals (private parts)  with your normal soap.  ? ?Wash thoroughly, paying special attention to the area where your surgery will be performed. ? ?Thoroughly rinse your body with warm water from the neck down. ? ?DO NOT shower/wash with your normal soap after using and rinsing off the CHG Soap. ? ?Pat yourself dry with a CLEAN TOWEL. ? ?Wear CLEAN PAJAMAS to bed the night before surgery ? ?Place CLEAN SHEETS on your bed the night before your surgery ? ?DO NOT SLEEP WITH PETS. ? ? ?Day of Surgery: ? ?Take a shower with CHG soap. ?Wear Clean/Comfortable clothing the morning of surgery ?Do not apply any deodorants/lotions.   ?Remember to brush your teeth WITH YOUR REGULAR TOOTHPASTE. ? ? ? ?If you received a COVID test during your pre-op visit  it is requested that you wear a mask when out in public, stay away from anyone that may not be feeling well and notify your surgeon if you develop symptoms. If you have been in contact with anyone that has tested positive in the last 10 days please notify you  surgeon. ? ?  ?Please read over the following fact sheets that you were given.  ? ?

## 2021-09-11 ENCOUNTER — Encounter: Payer: Self-pay | Admitting: Family Medicine

## 2021-09-12 ENCOUNTER — Other Ambulatory Visit: Payer: Self-pay

## 2021-09-12 ENCOUNTER — Ambulatory Visit (HOSPITAL_COMMUNITY)
Admission: RE | Admit: 2021-09-12 | Discharge: 2021-09-12 | Disposition: A | Discharge status: Home / Self Care | Payer: PPO | Source: Ambulatory Visit | Attending: Surgery | Admitting: Surgery

## 2021-09-12 ENCOUNTER — Encounter (HOSPITAL_COMMUNITY)
Admission: RE | Admit: 2021-09-12 | Discharge: 2021-09-12 | Disposition: A | Discharge status: Home / Self Care | Payer: PPO | Source: Ambulatory Visit | Attending: Orthopaedic Surgery | Admitting: Orthopaedic Surgery

## 2021-09-12 ENCOUNTER — Encounter (HOSPITAL_COMMUNITY): Payer: Self-pay

## 2021-09-12 DIAGNOSIS — E559 Vitamin D deficiency, unspecified: Secondary | ICD-10-CM | POA: Diagnosis not present

## 2021-09-12 DIAGNOSIS — Z01818 Encounter for other preprocedural examination: Secondary | ICD-10-CM | POA: Insufficient documentation

## 2021-09-12 DIAGNOSIS — G8918 Other acute postprocedural pain: Secondary | ICD-10-CM | POA: Diagnosis not present

## 2021-09-12 DIAGNOSIS — M170 Bilateral primary osteoarthritis of knee: Secondary | ICD-10-CM | POA: Diagnosis present

## 2021-09-12 DIAGNOSIS — R112 Nausea with vomiting, unspecified: Secondary | ICD-10-CM | POA: Diagnosis not present

## 2021-09-12 DIAGNOSIS — Z79890 Hormone replacement therapy (postmenopausal): Secondary | ICD-10-CM | POA: Diagnosis not present

## 2021-09-12 DIAGNOSIS — Z8261 Family history of arthritis: Secondary | ICD-10-CM | POA: Diagnosis not present

## 2021-09-12 DIAGNOSIS — E039 Hypothyroidism, unspecified: Secondary | ICD-10-CM | POA: Diagnosis not present

## 2021-09-12 DIAGNOSIS — K449 Diaphragmatic hernia without obstruction or gangrene: Secondary | ICD-10-CM | POA: Diagnosis not present

## 2021-09-12 DIAGNOSIS — E782 Mixed hyperlipidemia: Secondary | ICD-10-CM | POA: Diagnosis not present

## 2021-09-12 DIAGNOSIS — M1712 Unilateral primary osteoarthritis, left knee: Secondary | ICD-10-CM | POA: Diagnosis not present

## 2021-09-12 DIAGNOSIS — Z9109 Other allergy status, other than to drugs and biological substances: Secondary | ICD-10-CM | POA: Diagnosis not present

## 2021-09-12 DIAGNOSIS — Z6835 Body mass index (BMI) 35.0-35.9, adult: Secondary | ICD-10-CM | POA: Diagnosis not present

## 2021-09-12 DIAGNOSIS — Z0000 Encounter for general adult medical examination without abnormal findings: Secondary | ICD-10-CM | POA: Diagnosis not present

## 2021-09-12 DIAGNOSIS — I10 Essential (primary) hypertension: Secondary | ICD-10-CM | POA: Diagnosis not present

## 2021-09-12 DIAGNOSIS — Z79899 Other long term (current) drug therapy: Secondary | ICD-10-CM | POA: Diagnosis not present

## 2021-09-12 DIAGNOSIS — Z96652 Presence of left artificial knee joint: Secondary | ICD-10-CM | POA: Diagnosis not present

## 2021-09-12 DIAGNOSIS — Z818 Family history of other mental and behavioral disorders: Secondary | ICD-10-CM | POA: Diagnosis not present

## 2021-09-12 DIAGNOSIS — E669 Obesity, unspecified: Secondary | ICD-10-CM | POA: Diagnosis not present

## 2021-09-12 DIAGNOSIS — Z833 Family history of diabetes mellitus: Secondary | ICD-10-CM | POA: Diagnosis not present

## 2021-09-12 DIAGNOSIS — M8580 Other specified disorders of bone density and structure, unspecified site: Secondary | ICD-10-CM | POA: Diagnosis not present

## 2021-09-12 DIAGNOSIS — Z20822 Contact with and (suspected) exposure to covid-19: Secondary | ICD-10-CM | POA: Diagnosis not present

## 2021-09-12 DIAGNOSIS — Z800 Family history of malignant neoplasm of digestive organs: Secondary | ICD-10-CM | POA: Diagnosis not present

## 2021-09-12 DIAGNOSIS — Z8249 Family history of ischemic heart disease and other diseases of the circulatory system: Secondary | ICD-10-CM | POA: Diagnosis not present

## 2021-09-12 DIAGNOSIS — K219 Gastro-esophageal reflux disease without esophagitis: Secondary | ICD-10-CM | POA: Diagnosis not present

## 2021-09-12 DIAGNOSIS — T402X5A Adverse effect of other opioids, initial encounter: Secondary | ICD-10-CM | POA: Diagnosis not present

## 2021-09-12 DIAGNOSIS — Z83438 Family history of other disorder of lipoprotein metabolism and other lipidemia: Secondary | ICD-10-CM | POA: Diagnosis not present

## 2021-09-12 DIAGNOSIS — Z7982 Long term (current) use of aspirin: Secondary | ICD-10-CM | POA: Diagnosis not present

## 2021-09-12 HISTORY — DX: Inflammatory liver disease, unspecified: K75.9

## 2021-09-12 LAB — COMPREHENSIVE METABOLIC PANEL
ALT: 22 U/L (ref 0–44)
AST: 26 U/L (ref 15–41)
Albumin: 4.2 g/dL (ref 3.5–5.0)
Alkaline Phosphatase: 63 U/L (ref 38–126)
Anion gap: 6 (ref 5–15)
BUN: 17 mg/dL (ref 8–23)
CO2: 27 mmol/L (ref 22–32)
Calcium: 10 mg/dL (ref 8.9–10.3)
Chloride: 105 mmol/L (ref 98–111)
Creatinine, Ser: 0.9 mg/dL (ref 0.44–1.00)
GFR, Estimated: >60 mL/min (ref >60)
Glucose, Bld: 112 mg/dL — ABNORMAL HIGH (ref 70–99)
Potassium: 4.1 mmol/L (ref 3.5–5.1)
Sodium: 138 mmol/L (ref 135–145)
Total Bilirubin: 0.6 mg/dL (ref 0.3–1.2)
Total Protein: 6.7 g/dL (ref 6.5–8.1)

## 2021-09-12 LAB — CBC
HCT: 44.3 % (ref 36.0–46.0)
Hemoglobin: 14.4 g/dL (ref 12.0–15.0)
MCH: 30.8 pg (ref 26.0–34.0)
MCHC: 32.5 g/dL (ref 30.0–36.0)
MCV: 94.9 fL (ref 80.0–100.0)
Platelets: 272 10*3/uL (ref 150–400)
RBC: 4.67 MIL/uL (ref 3.87–5.11)
RDW: 13.3 % (ref 11.5–15.5)
WBC: 7.7 10*3/uL (ref 4.0–10.5)
nRBC: 0 % (ref 0.0–0.2)

## 2021-09-12 LAB — SURGICAL PCR SCREEN
MRSA, PCR: NEGATIVE
Staphylococcus aureus: NEGATIVE

## 2021-09-12 NOTE — Progress Notes (Signed)
PCP - Dr. Mardelle Matte ? ?Cardiologist - Dr. Julien Girt ? ?EP- Denies ? ?Endocrine- Denies ? ?Pulm- Denies ? ?Chest x-ray - 09/12/21 ? ?EKG - 09/12/21 ? ?Stress Test - Denies ? ?ECHO - 10/09/12 (E) ? ?Cardiac Cath - Denies ? ?AICD-na ?PM-na ?LOOP-na ? ?Nerve Stimulator- Denies ? ?Dialysis- Denies ? ?Sleep Study - Denies ?CPAP - Denies ? ?LABS- 09/12/21: CBC, CMP, PCR ? ?ASA- LD- 3/29 ? ?ERAS- Yes- 1 Ensure given  ? ?HA1C- Denies ? ?Anesthesia- No ? ?Pt denies having chest pain, sob, or fever at this time. All instructions explained to the pt, with a verbal understanding of the material. Pt agrees to go over the instructions while at home for a better understanding. Pt also instructed to wear a mask and social distance if she goes out. The opportunity to ask questions was provided.  ?

## 2021-09-13 ENCOUNTER — Ambulatory Visit (INDEPENDENT_AMBULATORY_CARE_PROVIDER_SITE_OTHER): Payer: PPO | Admitting: Family Medicine

## 2021-09-13 ENCOUNTER — Encounter: Payer: Self-pay | Admitting: Family Medicine

## 2021-09-13 ENCOUNTER — Telehealth: Payer: Self-pay | Admitting: *Deleted

## 2021-09-13 VITALS — BP 121/84 | HR 83 | Temp 98.6°F | Ht 62.0 in | Wt 193.8 lb

## 2021-09-13 DIAGNOSIS — Z0000 Encounter for general adult medical examination without abnormal findings: Secondary | ICD-10-CM

## 2021-09-13 DIAGNOSIS — E039 Hypothyroidism, unspecified: Secondary | ICD-10-CM

## 2021-09-13 DIAGNOSIS — M170 Bilateral primary osteoarthritis of knee: Secondary | ICD-10-CM

## 2021-09-13 DIAGNOSIS — E782 Mixed hyperlipidemia: Secondary | ICD-10-CM | POA: Diagnosis not present

## 2021-09-13 DIAGNOSIS — I10 Essential (primary) hypertension: Secondary | ICD-10-CM | POA: Diagnosis not present

## 2021-09-13 DIAGNOSIS — E559 Vitamin D deficiency, unspecified: Secondary | ICD-10-CM | POA: Diagnosis not present

## 2021-09-13 LAB — LIPID PANEL
Cholesterol: 193 mg/dL (ref 0–200)
HDL: 45.5 mg/dL (ref >39.00)
LDL Cholesterol: 110 mg/dL — ABNORMAL HIGH (ref 0–99)
NonHDL: 147.11
Total CHOL/HDL Ratio: 4
Triglycerides: 185 mg/dL — ABNORMAL HIGH (ref 0.0–149.0)
VLDL: 37 mg/dL (ref 0.0–40.0)

## 2021-09-13 LAB — TSH: TSH: 2.67 u[IU]/mL (ref 0.35–5.50)

## 2021-09-13 LAB — VITAMIN D 25 HYDROXY (VIT D DEFICIENCY, FRACTURES): VITD: 47.81 ng/mL (ref 30.00–100.00)

## 2021-09-13 NOTE — Patient Instructions (Signed)

## 2021-09-13 NOTE — Care Plan (Signed)
[facility] RNCM call to patient prior to her upcoming Left total knee arthroplasty with Dr. Ophelia Charter on 09/14/21. She is an Ortho bundle patient through [facility] and is agreeable to case management. She lives alone, but has a daughter who will be coming to assist her for a week post op, then a sister, who also will be available. She has a RW, 3in1/BSC, toilet riser, and shower chair in the home. No other DME needed. Anticipate HHPT will be needed after short hospital stay. Referral made to [facility] after choice provided. She requested OPPT be in [facility] location near her. CM will assist with scheduling when appropriate. Reviewed all post op care instructions. Will continue to follow for needs. ?

## 2021-09-13 NOTE — Progress Notes (Signed)
? ?Subjective  ?Chief Complaint  ?Patient presents with  ? Annual Exam  ?  She is not fasting. Did not specify any concerns. She states having labs done for Pre Op.   ? Hyperlipidemia  ? Hypertension  ? Hypothyroidism  ? ? ?HPI: Kelly Barajas is a 71 y.o. female who presents to [place] at [place] today for a Female Wellness Visit. She also has the concerns and/or needs as listed above in the chief complaint. These will be addressed in addition to the Health Maintenance Visit.  ? ?Wellness Visit: annual visit with health maintenance review and exam without Pap ? ?Health maintenance: All screens are current.  Overall, she continues to thrive. ?Chronic disease f/u and/or acute problem visit: (deemed necessary to be done in addition to the wellness visit): ?Bilateral end-stage knee osteoarthritis: For left knee replacement tomorrow and hopeful to have right knee replaced later this year.  Reviewed preop labs, normal CBC and CMP done yesterday. ?Hypertension:Feeling well. Taking medications w/o adverse effects. No symptoms of CHF, angina; no palpitations, sob, cp or lower extremity edema. Compliant with meds.  ?Hyperlipidemia on Livalo.  Tolerates well.  Fasting today for recheck. ?Hypothyroidism has been well controlled on 88 mcg of levothyroxine daily.  She feels good overall.  No energy concerns.  No symptoms of high or low thyroid.  She is compliant with medication. ?History of vitamin D deficiency: Takes over-the-counter calcium with vitamin D twice daily.  Bone density has osteopenia. ? ?Assessment  ?1. Annual physical exam   ?2. Essential hypertension   ?3. Mixed hyperlipidemia   ?4. Acquired hypothyroidism   ?5. Bilateral primary osteoarthritis of knee   ?6. Vitamin D deficiency   ? ?  ?Plan  ?Female Wellness Visit: ?Age appropriate Health Maintenance and Prevention measures were discussed with patient. Included topics are cancer screening recommendations, ways to keep healthy (see AVS)  including dietary and exercise recommendations, regular eye and dental care, use of seat belts, and avoidance of moderate alcohol use and tobacco use.  Screens are current ?BMI: discussed patient's BMI and encouraged positive lifestyle modifications to help get to or maintain a target BMI. ?HM needs and immunizations were addressed and ordered. See below for orders. See HM and immunization section for updates.  Immunizations are current ?Routine labs and screening tests ordered including cmp, cbc and lipids where appropriate. ?Discussed recommendations regarding Vit D and calcium supplementation (see AVS) ? ?Chronic disease management visit and/or acute problem visit: ?Hypertension is well controlled.  Continue low-dose Zestoretic.  Recheck renal function and electrolytes. ?Hyperlipidemia has been well controlled on Livalo 4 mg nightly.  Recheck today.  Fasting.  Check LFTs. ?Monitoring thyroid levels on levothyroxine 88 mcg daily.  Clinically euthyroid. ?Recheck vitamin D with history of osteopenia. ?Preop clearance: Average risk. ? ?Follow up: 6 months for blood pressure recheck ?Orders Placed This Encounter  ?Procedures  ? Lipid panel  ? TSH  ? VITAMIN D 25 Hydroxy (Vit-D Deficiency, Fractures)  ? ?No orders of the defined types were placed in this encounter. ? ?  ? ?Body mass index is 35.45 kg/m?. ?Wt Readings from Last 3 Encounters:  ?09/13/21 193 lb 12.8 oz (87.9 kg)  ?09/12/21 194 lb 8 oz (88.2 kg)  ?09/01/21 190 lb (86.2 kg)  ? ? ? ?Patient Active Problem List  ? Diagnosis Date Noted  ? Hypothyroidism 06/01/2014  ?  Priority: High  ? Essential hypertension 11/27/2013  ?  Priority: High  ?  Negative Stress  Test 2017: false positive EKG changes. ?  ? Mixed hyperlipidemia 10/04/2012  ?  Priority: High  ?  Intolerant to crestor and lipitor: myalgias and mm cramps; tolerating livalo ?  ? Osteopenia 08/21/2019  ?  Priority: Medium   ?  Dexa 02/2021 lowest T =-1.8, on caltrate plus bid and exercise ?  ?  Gastroesophageal reflux disease without esophagitis 11/27/2013  ?  Priority: Medium   ? Bilateral primary osteoarthritis of knee 02/19/2019  ?  Priority: Low  ? Obesity (BMI 30-39.9) 09/05/2016  ?  Priority: Low  ? Overactive bladder 03/07/2016  ?  Priority: Low  ? Vitamin D deficiency 03/07/2016  ?  Priority: Low  ? Elevated LFTs 09/01/2020  ?  Improved over time; neg hep b and c. Likely fatty liver. Never imaged. asypmtomatic ?  ? ?Health Maintenance  ?Topic Date Due  ? COVID-19 Vaccine (3 - Booster for Janssen series) 09/29/2021 (Originally 01/06/2020)  ? TETANUS/TDAP  10/17/2021  ? INFLUENZA VACCINE  01/10/2022  ? MAMMOGRAM  05/11/2022  ? DEXA SCAN  03/03/2024  ? COLONOSCOPY (Pts 45-4yrs Insurance coverage will need to be confirmed)  03/26/2025  ? Pneumonia Vaccine 43+ Years old  Completed  ? Hepatitis C Screening  Completed  ? Zoster Vaccines- Shingrix  Completed  ? HPV VACCINES  Aged Out  ? ?Immunization History  ?Administered Date(s) Administered  ? Fluad Quad(high Dose 65+) 02/19/2019, 03/03/2020, 03/08/2021  ? Influenza, High Dose Seasonal PF 02/12/2018  ? Janssen (J&J) SARS-COV-2 Vaccination 10/22/2019, 11/11/2019  ? Pneumococcal Conjugate-13 09/05/2016  ? Pneumococcal Polysaccharide-23 08/17/2015, 10/23/2017  ? Tdap 10/18/2011  ? Zoster Recombinat (Shingrix) 02/28/2018, 08/28/2018  ? ?We updated and reviewed the patient's past history in detail and it is documented below. ?Allergies: ?Patient is allergic to nickel. ?Past Medical History ?Patient  has a past medical history of Arthritis, Essential hypertension (11/27/2013), GERD (gastroesophageal reflux disease), Hepatitis, Hyperlipidemia, Hypothyroidism, and Osteopenia (08/21/2019). ?Past Surgical History ?Patient  has a past surgical history that includes Tonsillectomy; Dilation and curettage, diagnostic / therapeutic; Knee arthroscopy w/ meniscal repair (Right, 07/16/2017); and Dilation and curettage of uterus. ?Family History: ?Patient family history  includes Arthritis in her mother; CAD in her father and mother; Colon cancer (age of onset: 53) in her maternal grandmother; Depression in her mother; Diabetes in her mother; Diabetes Mellitus II in her mother; Heart attack in her father; Hyperlipidemia in her father and mother; Hypertension in her brother, mother, and sister; Hypothyroidism in her daughter and daughter. ?Social History:  ?Patient  reports that she has never smoked. She has never used smokeless tobacco. She reports current alcohol use. She reports that she does not use drugs. ? ?Review of Systems: ?Constitutional: negative for fever or malaise ?Ophthalmic: negative for photophobia, double vision or loss of vision ?Cardiovascular: negative for chest pain, dyspnea on exertion, or new LE swelling ?Respiratory: negative for SOB or persistent cough ?Gastrointestinal: negative for abdominal pain, change in bowel habits or melena ?Genitourinary: negative for dysuria or gross hematuria, no abnormal uterine bleeding or disharge ?Musculoskeletal: negative for new gait disturbance or muscular weakness ?Integumentary: negative for new or persistent rashes, no breast lumps ?Neurological: negative for TIA or stroke symptoms ?Psychiatric: negative for SI or delusions ?Allergic/Immunologic: negative for hives ? ?Patient Care Team  ?  Relationship Specialty Notifications Start End  ?Leamon Arnt, MD PCP - General Family Medicine  02/12/18   ?Marybelle Killings, MD Consulting Physician Orthopedic Surgery  08/15/17   ?Obgyn, Erling Conte    02/21/18   ?  Satira Sark, MD Consulting Physician Cardiology  02/21/18   ?Melina Schools, OD Consulting Physician Optometry  03/24/19   ?Edythe Clarity, [facility]  Pharmacist  04/05/21   ? Comment: [phone]  ? ? ?Objective  ?Vitals: BP 121/84   Pulse 83   Temp 98.6 ?F (37 ?C) (Temporal)   Ht 5\' 2"  (1.575 m)   Wt 193 lb 12.8 oz (87.9 kg)   SpO2 97%   BMI 35.45 kg/m?  ?General:  Well developed, well nourished, no acute distress   ?Psych:  Alert and orientedx3,normal mood and affect ?HEENT:  Normocephalic, atraumatic, non-icteric sclera,  supple neck without adenopathy, mass or thyromegaly ?Cardiovascular:  Normal S1, S2, RRR without gallop, rub or

## 2021-09-13 NOTE — Telephone Encounter (Signed)
Ortho bundle Pre-op call completed. 

## 2021-09-13 NOTE — H&P (Signed)
TOTAL KNEE ADMISSION H&P ? ?Patient is being admitted for left total knee arthroplasty and right knee injection ?Subjective: ? ?Chief Complaint:left knee pain. ? ?HPI: Kelly Barajas, 71 y.o. female, has a history of pain and functional disability in the left knee due to arthritis and has failed non-surgical conservative treatments for greater than 12 weeks to includeNSAID's and/or analgesics, corticosteriod injections, supervised PT with diminished ADL's post treatment, use of assistive devices, and activity modification.  Onset of symptoms was gradual, starting 10 years ago with gradually worsening course since that time.  Patient currently rates pain in the left knee(s) at 10 out of 10 with activity. Patient has night pain, worsening of pain with activity and weight bearing, pain that interferes with activities of daily living, pain with passive range of motion, crepitus, and joint swelling.  Patient has evidence of subchondral cysts, subchondral sclerosis, periarticular osteophytes, and joint space narrowing by imaging studies.. There is no active infection. ? ?Patient Active Problem List  ? Diagnosis Date Noted  ? Elevated LFTs 09/01/2020  ? Osteopenia 08/21/2019  ? Bilateral primary osteoarthritis of knee 02/19/2019  ? Obesity (BMI 30-39.9) 09/05/2016  ? Overactive bladder 03/07/2016  ? Vitamin D deficiency 03/07/2016  ? Hypothyroidism 06/01/2014  ? Gastroesophageal reflux disease without esophagitis 11/27/2013  ? Essential hypertension 11/27/2013  ? Mixed hyperlipidemia 10/04/2012  ? ?Past Medical History:  ?Diagnosis Date  ? Arthritis   ? Essential hypertension 11/27/2013  ? GERD (gastroesophageal reflux disease)   ? Hepatitis   ? Diagnosed 50 years ago, unsure of which type, but she did have jaundice.  ? Hyperlipidemia   ? Hypothyroidism   ? Osteopenia 08/21/2019  ?  ?Past Surgical History:  ?Procedure Laterality Date  ? DILATION AND CURETTAGE OF UTERUS    ? DILATION AND CURETTAGE, DIAGNOSTIC /  THERAPEUTIC    ? KNEE ARTHROSCOPY W/ MENISCAL REPAIR Right 07/16/2017  ? TONSILLECTOMY    ?  ?No current facility-administered medications for this encounter.  ? ?Current Outpatient Medications  ?Medication Sig Dispense Refill Last Dose  ? aspirin EC 81 MG tablet Take 81 mg by mouth at bedtime. (Patient not taking: Reported on 09/13/2021)     ? Calcium Citrate-Vitamin D (CALCIUM + D PO) Take 1 tablet by mouth 2 (two) times daily. (Patient not taking: Reported on 09/13/2021)     ? Coenzyme Q10 (COQ10) 100 MG CAPS Take 100 mg by mouth daily. (Patient not taking: Reported on 09/13/2021)     ? lisinopril-hydrochlorothiazide (ZESTORETIC) 20-12.5 MG tablet Take 1 tablet by mouth daily.     ? LIVALO 4 MG TABS TAKE 1 TABLET BY MOUTH AT BEDTIME 90 tablet 3   ? magnesium gluconate (MAGONATE) 500 MG tablet Take 500 mg by mouth in the morning and at bedtime. (Patient not taking: Reported on 09/13/2021)     ? Omega-3 Fatty Acids (FISH OIL PO) Take 2,000 mg by mouth 2 (two) times daily.     ? omeprazole (PRILOSEC) 20 MG capsule Take 20 mg by mouth daily. (Patient not taking: Reported on 09/13/2021)     ? OVER THE COUNTER MEDICATION Inhale 1 puff into the lungs daily as needed (congestion). himalayan pink salt inhaler     ? SYNTHROID 88 MCG tablet TAKE 1 TABLET BY MOUTH ONCE DAILY BEFORE BREAKFAST 90 tablet 0   ? ?Allergies  ?Allergen Reactions  ? Nickel Itching  ?  ?Social History  ? ?Tobacco Use  ? Smoking status: Never  ? Smokeless tobacco: Never  ?Substance  Use Topics  ? Alcohol use: Yes  ?  Comment: socially  ?  ?Family History  ?Problem Relation Age of Onset  ? Hyperlipidemia Mother   ? Diabetes Mellitus II Mother   ? CAD Mother   ? Hypertension Mother   ? Depression Mother   ? Diabetes Mother   ? Arthritis Mother   ? CAD Father   ? Hyperlipidemia Father   ? Heart attack Father   ? Hypertension Sister   ? Hypertension Brother   ? Hypothyroidism Daughter   ? Colon cancer Maternal Grandmother 46  ? Hypothyroidism Daughter   ?  ? ?Review  of Systems  ?Constitutional:  Positive for activity change.  ?HENT: Negative.    ?Respiratory: Negative.    ?Cardiovascular: Negative.   ?Genitourinary: Negative.   ?Psychiatric/Behavioral: Negative.    ? ?Objective: ? ?Physical Exam ?HENT:  ?   Head: Normocephalic.  ?   Nose: Nose normal.  ?Eyes:  ?   Extraocular Movements: Extraocular movements intact.  ?Cardiovascular:  ?   Rate and Rhythm: Regular rhythm.  ?   Heart sounds: Normal heart sounds.  ?Pulmonary:  ?   Effort: Pulmonary effort is normal. No respiratory distress.  ?Abdominal:  ?   General: Bowel sounds are normal.  ?   Tenderness: There is no abdominal tenderness.  ?Neurological:  ?   Mental Status: She is alert and oriented to person, place, and time.  ?Psychiatric:     ?   Mood and Affect: Mood normal.  ? ? ?Vital signs in last 24 hours: ?Temp:  [97.7 ?F (36.5 ?C)-98.6 ?F (37 ?C)] 98.6 ?F (37 ?C) (04/04 [id number]) ?Pulse Rate:  [83-94] 83 (04/04 0833) ?Resp:  [18] 18 (04/03 1452) ?BP: (121-134)/(78-84) 121/84 (04/04 [id number]) ?SpO2:  [96 %-97 %] 97 % (04/04 0833) ?Weight:  [87.9 kg-88.2 kg] 87.9 kg (04/04 0833) ? ?Labs: ? ? ?Estimated body mass index is 35.45 kg/m? as calculated from the following: ?  Height as of 09/13/21: 5\' 2"  (1.575 m). ?  Weight as of 09/13/21: 87.9 kg. ? ? ?Imaging Review ?Plain radiographs demonstrate moderate degenerative joint disease of the left knee(s). The overall alignment ismild varus. The bone quality appears to be good for age and reported activity level. ? ? ? ? ? ?Assessment/Plan: ? ?End stage arthritis, left knee  ? ?The patient history, physical examination, clinical judgment of the provider and imaging studies are consistent with end stage degenerative joint disease of the left knee(s) and total knee arthroplasty is deemed medically necessary. The treatment options including medical management, injection therapy arthroscopy and arthroplasty were discussed at length. The risks and benefits of total knee arthroplasty were  presented and reviewed. The risks due to aseptic loosening, infection, stiffness, patella tracking problems, thromboembolic complications and other imponderables were discussed. The patient acknowledged the explanation, agreed to proceed with the plan and consent was signed. Patient is being admitted for inpatient treatment for surgery, pain control, PT, OT, prophylactic antibiotics, VTE prophylaxis, progressive ambulation and ADL's and discharge planning. The patient is planning to be discharged home with home health services ? ?Will also do right knee intra-articular marc/depo injection under anesthesia.  ? ? ? ?Patient's anticipated LOS is less than 2 midnights, meeting these requirements: ?- Younger than 67 ?- Lives within 1 hour of care ?- Has a competent adult at home to recover with post-op recover ?- NO history of ? - Chronic pain requiring opiods ? - Diabetes ? - Coronary Artery Disease ? - Heart  failure ? - Heart attack ? - Stroke ? - DVT/VTE ? - Cardiac arrhythmia ? - Respiratory Failure/COPD ? - Renal failure ? - Anemia ? - Advanced Liver disease ? ? ?

## 2021-09-14 ENCOUNTER — Other Ambulatory Visit: Payer: Self-pay

## 2021-09-14 ENCOUNTER — Observation Stay (HOSPITAL_COMMUNITY): Payer: PPO

## 2021-09-14 ENCOUNTER — Encounter (HOSPITAL_COMMUNITY)
Admission: RE | Disposition: A | Discharge status: Home / Self Care | Payer: Self-pay | Source: Home / Self Care | Attending: Orthopaedic Surgery

## 2021-09-14 ENCOUNTER — Inpatient Hospital Stay (HOSPITAL_COMMUNITY)
Admission: RE | Admit: 2021-09-14 | Discharge: 2021-09-16 | DRG: 470 | Disposition: A | Discharge status: Home Health | Payer: PPO | Attending: Orthopaedic Surgery | Admitting: Orthopaedic Surgery

## 2021-09-14 ENCOUNTER — Ambulatory Visit (HOSPITAL_COMMUNITY): Payer: PPO | Admitting: Certified Registered Nurse Anesthetist

## 2021-09-14 ENCOUNTER — Encounter (HOSPITAL_COMMUNITY): Payer: Self-pay | Admitting: Orthopaedic Surgery

## 2021-09-14 DIAGNOSIS — E039 Hypothyroidism, unspecified: Secondary | ICD-10-CM | POA: Diagnosis present

## 2021-09-14 DIAGNOSIS — Z6835 Body mass index (BMI) 35.0-35.9, adult: Secondary | ICD-10-CM

## 2021-09-14 DIAGNOSIS — Z96652 Presence of left artificial knee joint: Secondary | ICD-10-CM | POA: Diagnosis not present

## 2021-09-14 DIAGNOSIS — Z20822 Contact with and (suspected) exposure to covid-19: Secondary | ICD-10-CM | POA: Diagnosis present

## 2021-09-14 DIAGNOSIS — G8918 Other acute postprocedural pain: Secondary | ICD-10-CM | POA: Diagnosis not present

## 2021-09-14 DIAGNOSIS — Z818 Family history of other mental and behavioral disorders: Secondary | ICD-10-CM

## 2021-09-14 DIAGNOSIS — M1712 Unilateral primary osteoarthritis, left knee: Secondary | ICD-10-CM | POA: Diagnosis not present

## 2021-09-14 DIAGNOSIS — Z83438 Family history of other disorder of lipoprotein metabolism and other lipidemia: Secondary | ICD-10-CM

## 2021-09-14 DIAGNOSIS — M8580 Other specified disorders of bone density and structure, unspecified site: Secondary | ICD-10-CM | POA: Diagnosis present

## 2021-09-14 DIAGNOSIS — Z7982 Long term (current) use of aspirin: Secondary | ICD-10-CM

## 2021-09-14 DIAGNOSIS — K219 Gastro-esophageal reflux disease without esophagitis: Secondary | ICD-10-CM | POA: Diagnosis present

## 2021-09-14 DIAGNOSIS — Z01818 Encounter for other preprocedural examination: Principal | ICD-10-CM

## 2021-09-14 DIAGNOSIS — Z79890 Hormone replacement therapy (postmenopausal): Secondary | ICD-10-CM

## 2021-09-14 DIAGNOSIS — I10 Essential (primary) hypertension: Secondary | ICD-10-CM | POA: Diagnosis not present

## 2021-09-14 DIAGNOSIS — Z8261 Family history of arthritis: Secondary | ICD-10-CM

## 2021-09-14 DIAGNOSIS — R112 Nausea with vomiting, unspecified: Secondary | ICD-10-CM | POA: Diagnosis not present

## 2021-09-14 DIAGNOSIS — Z833 Family history of diabetes mellitus: Secondary | ICD-10-CM

## 2021-09-14 DIAGNOSIS — T402X5A Adverse effect of other opioids, initial encounter: Secondary | ICD-10-CM | POA: Diagnosis not present

## 2021-09-14 DIAGNOSIS — Z800 Family history of malignant neoplasm of digestive organs: Secondary | ICD-10-CM

## 2021-09-14 DIAGNOSIS — Z8249 Family history of ischemic heart disease and other diseases of the circulatory system: Secondary | ICD-10-CM

## 2021-09-14 DIAGNOSIS — Z79899 Other long term (current) drug therapy: Secondary | ICD-10-CM

## 2021-09-14 DIAGNOSIS — Z9109 Other allergy status, other than to drugs and biological substances: Secondary | ICD-10-CM

## 2021-09-14 DIAGNOSIS — E669 Obesity, unspecified: Secondary | ICD-10-CM | POA: Diagnosis present

## 2021-09-14 DIAGNOSIS — M170 Bilateral primary osteoarthritis of knee: Secondary | ICD-10-CM | POA: Diagnosis not present

## 2021-09-14 HISTORY — PX: INJECTION KNEE: SHX2446

## 2021-09-14 HISTORY — PX: TOTAL KNEE ARTHROPLASTY: SHX125

## 2021-09-14 SURGERY — ARTHROPLASTY, KNEE, TOTAL
Anesthesia: Monitor Anesthesia Care | Site: Knee | Laterality: Right

## 2021-09-14 MED ORDER — DEXAMETHASONE SODIUM PHOSPHATE 10 MG/ML IJ SOLN
INTRAMUSCULAR | Status: AC
  Filled 2021-09-14: qty 2

## 2021-09-14 MED ORDER — PHENOL 1.4 % MT LIQD: 1.0000 | OROMUCOSAL | Status: DC | PRN

## 2021-09-14 MED ORDER — LISINOPRIL-HYDROCHLOROTHIAZIDE 20-12.5 MG PO TABS: 1.0000 | ORAL_TABLET | Freq: Every day | ORAL | Status: DC

## 2021-09-14 MED ORDER — HYDROMORPHONE HCL 1 MG/ML IJ SOLN
0.5000 mg | INTRAMUSCULAR | Status: DC | PRN
  Administered 2021-09-15: 0.5 mg via INTRAVENOUS
  Filled 2021-09-14: qty 0.5

## 2021-09-14 MED ORDER — PROPOFOL 10 MG/ML IV BOLUS
INTRAVENOUS | Status: DC | PRN
  Administered 2021-09-14: 50 mg via INTRAVENOUS

## 2021-09-14 MED ORDER — LEVOTHYROXINE SODIUM 88 MCG PO TABS
88.0000 ug | ORAL_TABLET | Freq: Every day | ORAL | Status: DC
  Administered 2021-09-15 – 2021-09-16 (×2): 88 ug via ORAL
  Filled 2021-09-14 (×2): qty 1

## 2021-09-14 MED ORDER — OXYCODONE HCL 5 MG PO TABS: 5.0000 mg | ORAL_TABLET | Freq: Once | ORAL | Status: DC | PRN

## 2021-09-14 MED ORDER — PROPOFOL 500 MG/50ML IV EMUL
INTRAVENOUS | Status: DC | PRN
  Administered 2021-09-14: 75 ug/kg/min via INTRAVENOUS

## 2021-09-14 MED ORDER — BUPIVACAINE HCL (PF) 0.25 % IJ SOLN
INTRAMUSCULAR | Status: AC
  Filled 2021-09-14: qty 30

## 2021-09-14 MED ORDER — PROPOFOL 10 MG/ML IV BOLUS
INTRAVENOUS | Status: AC
  Filled 2021-09-14: qty 20

## 2021-09-14 MED ORDER — TRANEXAMIC ACID-NACL 1000-0.7 MG/100ML-% IV SOLN
INTRAVENOUS | Status: DC | PRN
  Administered 2021-09-14: 1000 mg via INTRAVENOUS

## 2021-09-14 MED ORDER — SODIUM CHLORIDE 0.9 % IV SOLN: INTRAVENOUS | Status: DC

## 2021-09-14 MED ORDER — FENTANYL CITRATE (PF) 100 MCG/2ML IJ SOLN: 50.0000 ug | Freq: Once | INTRAMUSCULAR | Status: AC

## 2021-09-14 MED ORDER — METOCLOPRAMIDE HCL 5 MG/ML IJ SOLN
5.0000 mg | Freq: Three times a day (TID) | INTRAMUSCULAR | Status: DC | PRN
  Administered 2021-09-15: 10 mg via INTRAVENOUS
  Filled 2021-09-14: qty 2

## 2021-09-14 MED ORDER — ACETAMINOPHEN 160 MG/5ML PO SOLN: 1000.0000 mg | Freq: Once | ORAL | Status: DC | PRN

## 2021-09-14 MED ORDER — ACETAMINOPHEN 10 MG/ML IV SOLN
INTRAVENOUS | Status: AC
  Filled 2021-09-14: qty 100

## 2021-09-14 MED ORDER — LACTATED RINGERS IV SOLN: INTRAVENOUS | Status: DC

## 2021-09-14 MED ORDER — OXYCODONE HCL 5 MG/5ML PO SOLN: 5.0000 mg | Freq: Once | ORAL | Status: DC | PRN

## 2021-09-14 MED ORDER — ORAL CARE MOUTH RINSE: 15.0000 mL | Freq: Once | OROMUCOSAL | Status: AC

## 2021-09-14 MED ORDER — SUCCINYLCHOLINE CHLORIDE 200 MG/10ML IV SOSY
PREFILLED_SYRINGE | INTRAVENOUS | Status: AC
  Filled 2021-09-14: qty 10

## 2021-09-14 MED ORDER — ONDANSETRON HCL 4 MG/2ML IJ SOLN
4.0000 mg | Freq: Four times a day (QID) | INTRAMUSCULAR | Status: DC | PRN
  Administered 2021-09-15: 4 mg via INTRAVENOUS
  Filled 2021-09-14: qty 2

## 2021-09-14 MED ORDER — PANTOPRAZOLE SODIUM 40 MG PO TBEC
40.0000 mg | DELAYED_RELEASE_TABLET | Freq: Every day | ORAL | Status: DC
  Administered 2021-09-16: 40 mg via ORAL
  Filled 2021-09-14 (×2): qty 1

## 2021-09-14 MED ORDER — ONDANSETRON HCL 4 MG/2ML IJ SOLN
INTRAMUSCULAR | Status: DC | PRN
  Administered 2021-09-14: 4 mg via INTRAVENOUS

## 2021-09-14 MED ORDER — ACETAMINOPHEN 325 MG PO TABS: 325.0000 mg | ORAL_TABLET | Freq: Four times a day (QID) | ORAL | Status: DC | PRN

## 2021-09-14 MED ORDER — POLYETHYLENE GLYCOL 3350 17 G PO PACK: 17.0000 g | PACK | Freq: Every day | ORAL | Status: DC | PRN

## 2021-09-14 MED ORDER — ROPIVACAINE HCL 7.5 MG/ML IJ SOLN
INTRAMUSCULAR | Status: DC | PRN
  Administered 2021-09-14: 20 mL via PERINEURAL

## 2021-09-14 MED ORDER — BUPIVACAINE LIPOSOME 1.3 % IJ SUSP
INTRAMUSCULAR | Status: DC | PRN
  Administered 2021-09-14: 40 mL via INTRAMUSCULAR

## 2021-09-14 MED ORDER — PROPOFOL 1000 MG/100ML IV EMUL
INTRAVENOUS | Status: AC
  Filled 2021-09-14: qty 100

## 2021-09-14 MED ORDER — MAGNESIUM OXIDE -MG SUPPLEMENT 400 (240 MG) MG PO TABS
400.0000 mg | ORAL_TABLET | Freq: Two times a day (BID) | ORAL | Status: DC
  Administered 2021-09-14 – 2021-09-16 (×3): 400 mg via ORAL
  Filled 2021-09-14 (×4): qty 1

## 2021-09-14 MED ORDER — PRAVASTATIN SODIUM 40 MG PO TABS
80.0000 mg | ORAL_TABLET | Freq: Every day | ORAL | Status: DC
  Administered 2021-09-15: 80 mg via ORAL
  Filled 2021-09-14: qty 2

## 2021-09-14 MED ORDER — FENTANYL CITRATE (PF) 100 MCG/2ML IJ SOLN
25.0000 ug | INTRAMUSCULAR | Status: DC | PRN
  Administered 2021-09-14 (×2): 25 ug via INTRAVENOUS

## 2021-09-14 MED ORDER — ACETAMINOPHEN 10 MG/ML IV SOLN: 1000.0000 mg | Freq: Once | INTRAVENOUS | Status: DC | PRN

## 2021-09-14 MED ORDER — MENTHOL 3 MG MT LOZG: 1.0000 | LOZENGE | OROMUCOSAL | Status: DC | PRN

## 2021-09-14 MED ORDER — BUPIVACAINE LIPOSOME 1.3 % IJ SUSP
INTRAMUSCULAR | Status: AC
  Filled 2021-09-14: qty 20

## 2021-09-14 MED ORDER — OXYCODONE HCL 5 MG PO TABS
ORAL_TABLET | ORAL | Status: AC
Start: 2021-09-14 — End: 2021-09-15
  Filled 2021-09-14: qty 1

## 2021-09-14 MED ORDER — DOCUSATE SODIUM 100 MG PO CAPS
100.0000 mg | ORAL_CAPSULE | Freq: Two times a day (BID) | ORAL | Status: DC
  Administered 2021-09-14 – 2021-09-16 (×3): 100 mg via ORAL
  Filled 2021-09-14 (×4): qty 1

## 2021-09-14 MED ORDER — METHOCARBAMOL 1000 MG/10ML IJ SOLN
500.0000 mg | Freq: Four times a day (QID) | INTRAVENOUS | Status: DC | PRN
  Filled 2021-09-14: qty 5

## 2021-09-14 MED ORDER — METHOCARBAMOL 500 MG PO TABS
500.0000 mg | ORAL_TABLET | Freq: Four times a day (QID) | ORAL | Status: DC | PRN
  Administered 2021-09-14 – 2021-09-15 (×2): 500 mg via ORAL
  Filled 2021-09-14 (×2): qty 1

## 2021-09-14 MED ORDER — BUPIVACAINE HCL 0.25 % IJ SOLN
INTRAMUSCULAR | Status: DC | PRN
  Administered 2021-09-14: 8 mL via INTRA_ARTICULAR

## 2021-09-14 MED ORDER — MAGNESIUM GLUCONATE 500 MG PO TABS: 500.0000 mg | ORAL_TABLET | Freq: Two times a day (BID) | ORAL | Status: DC

## 2021-09-14 MED ORDER — CHLORHEXIDINE GLUCONATE 0.12 % MT SOLN: 15.0000 mL | Freq: Once | OROMUCOSAL | Status: AC

## 2021-09-14 MED ORDER — GLYCOPYRROLATE PF 0.2 MG/ML IJ SOSY
PREFILLED_SYRINGE | INTRAMUSCULAR | Status: AC
  Filled 2021-09-14: qty 1

## 2021-09-14 MED ORDER — BUPIVACAINE IN DEXTROSE 0.75-8.25 % IT SOLN
INTRATHECAL | Status: DC | PRN
  Administered 2021-09-14: 2 mL via INTRATHECAL

## 2021-09-14 MED ORDER — ROCURONIUM BROMIDE 10 MG/ML (PF) SYRINGE
PREFILLED_SYRINGE | INTRAVENOUS | Status: AC
  Filled 2021-09-14: qty 10

## 2021-09-14 MED ORDER — FENTANYL CITRATE (PF) 100 MCG/2ML IJ SOLN
INTRAMUSCULAR | Status: AC
  Filled 2021-09-14: qty 2

## 2021-09-14 MED ORDER — TRANEXAMIC ACID-NACL 1000-0.7 MG/100ML-% IV SOLN
INTRAVENOUS | Status: AC
  Filled 2021-09-14: qty 100

## 2021-09-14 MED ORDER — ONDANSETRON HCL 4 MG/2ML IJ SOLN
INTRAMUSCULAR | Status: AC
  Filled 2021-09-14: qty 6

## 2021-09-14 MED ORDER — CEFAZOLIN SODIUM-DEXTROSE 2-4 GM/100ML-% IV SOLN
2.0000 g | INTRAVENOUS | Status: AC
  Administered 2021-09-14: 2 g via INTRAVENOUS

## 2021-09-14 MED ORDER — CEFAZOLIN SODIUM-DEXTROSE 2-4 GM/100ML-% IV SOLN
INTRAVENOUS | Status: AC
  Filled 2021-09-14: qty 100

## 2021-09-14 MED ORDER — CHLORHEXIDINE GLUCONATE 0.12 % MT SOLN
OROMUCOSAL | Status: AC
  Administered 2021-09-14: 15 mL via OROMUCOSAL
  Filled 2021-09-14: qty 15

## 2021-09-14 MED ORDER — LISINOPRIL 20 MG PO TABS
20.0000 mg | ORAL_TABLET | Freq: Every day | ORAL | Status: DC
Start: 2021-09-15 — End: 2021-09-16
  Administered 2021-09-16: 20 mg via ORAL
  Filled 2021-09-14 (×2): qty 1

## 2021-09-14 MED ORDER — PHENYLEPHRINE HCL-NACL 20-0.9 MG/250ML-% IV SOLN
INTRAVENOUS | Status: DC | PRN
  Administered 2021-09-14: 25 ug/min via INTRAVENOUS
  Administered 2021-09-14: 50 ug/min via INTRAVENOUS

## 2021-09-14 MED ORDER — EPHEDRINE 5 MG/ML INJ
INTRAVENOUS | Status: AC
  Filled 2021-09-14: qty 5

## 2021-09-14 MED ORDER — BUPIVACAINE LIPOSOME 1.3 % IJ SUSP: 20.0000 mL | Freq: Once | INTRAMUSCULAR | Status: DC

## 2021-09-14 MED ORDER — METHYLPREDNISOLONE ACETATE 80 MG/ML IJ SUSP
INTRAMUSCULAR | Status: AC
  Filled 2021-09-14: qty 1

## 2021-09-14 MED ORDER — HYDROMORPHONE HCL 2 MG PO TABS
1.0000 mg | ORAL_TABLET | ORAL | Status: DC | PRN
  Administered 2021-09-14 – 2021-09-16 (×5): 2 mg via ORAL
  Filled 2021-09-14 (×5): qty 1

## 2021-09-14 MED ORDER — ONDANSETRON HCL 4 MG PO TABS
4.0000 mg | ORAL_TABLET | Freq: Four times a day (QID) | ORAL | Status: DC | PRN
  Administered 2021-09-16: 4 mg via ORAL
  Filled 2021-09-14: qty 1

## 2021-09-14 MED ORDER — ASPIRIN EC 325 MG PO TBEC
325.0000 mg | DELAYED_RELEASE_TABLET | Freq: Every day | ORAL | Status: DC
  Administered 2021-09-16: 325 mg via ORAL
  Filled 2021-09-14 (×2): qty 1

## 2021-09-14 MED ORDER — LIDOCAINE 2% (20 MG/ML) 5 ML SYRINGE
INTRAMUSCULAR | Status: AC
  Filled 2021-09-14: qty 5

## 2021-09-14 MED ORDER — OXYCODONE HCL 5 MG PO TABS
5.0000 mg | ORAL_TABLET | ORAL | Status: DC | PRN
  Administered 2021-09-16: 5 mg via ORAL
  Filled 2021-09-14 (×2): qty 1

## 2021-09-14 MED ORDER — METHYLPREDNISOLONE ACETATE 40 MG/ML IJ SUSP
INTRAMUSCULAR | Status: AC
  Filled 2021-09-14: qty 1

## 2021-09-14 MED ORDER — ACETAMINOPHEN 10 MG/ML IV SOLN
INTRAVENOUS | Status: DC | PRN
  Administered 2021-09-14: 1000 mg via INTRAVENOUS

## 2021-09-14 MED ORDER — ACETAMINOPHEN 500 MG PO TABS: 1000.0000 mg | ORAL_TABLET | Freq: Once | ORAL | Status: DC | PRN

## 2021-09-14 MED ORDER — METOCLOPRAMIDE HCL 5 MG PO TABS: 5.0000 mg | ORAL_TABLET | Freq: Three times a day (TID) | ORAL | Status: DC | PRN

## 2021-09-14 MED ORDER — HYDROCHLOROTHIAZIDE 12.5 MG PO TABS
12.5000 mg | ORAL_TABLET | Freq: Every day | ORAL | Status: DC
  Administered 2021-09-16: 12.5 mg via ORAL
  Filled 2021-09-14 (×2): qty 1

## 2021-09-14 MED ORDER — FENTANYL CITRATE (PF) 100 MCG/2ML IJ SOLN
INTRAMUSCULAR | Status: AC
  Administered 2021-09-14: 50 ug via INTRAVENOUS
  Filled 2021-09-14: qty 2

## 2021-09-14 MED ORDER — GLYCOPYRROLATE PF 0.2 MG/ML IJ SOSY
PREFILLED_SYRINGE | INTRAMUSCULAR | Status: DC | PRN
  Administered 2021-09-14: .2 mg via INTRAVENOUS

## 2021-09-14 MED ORDER — MIDAZOLAM HCL 2 MG/2ML IJ SOLN: 2.0000 mg | Freq: Once | INTRAMUSCULAR | Status: AC

## 2021-09-14 MED ORDER — SODIUM CHLORIDE 0.9 % IR SOLN
Status: DC | PRN
  Administered 2021-09-14: 3000 mL

## 2021-09-14 MED ORDER — MIDAZOLAM HCL 2 MG/2ML IJ SOLN
INTRAMUSCULAR | Status: AC
  Administered 2021-09-14: 2 mg via INTRAVENOUS
  Filled 2021-09-14: qty 2

## 2021-09-14 SURGICAL SUPPLY — 65 items
ATTUNE MED DOME PAT 38 KNEE (Knees) ×1 IMPLANT
ATTUNE PS FEM LT SZ 6 CEM KNEE (Femur) ×1 IMPLANT
ATTUNE PSRP INSR SZ6 5 KNEE (Insert) ×1 IMPLANT
BAG COUNTER SPONGE SURGICOUNT (BAG) ×3 IMPLANT
BANDAGE ESMARK 6X9 LF (GAUZE/BANDAGES/DRESSINGS) ×2 IMPLANT
BASE TIBIAL ROT PLAT SZ 5 KNEE (Knees) IMPLANT
BLADE SAGITTAL 25.0X1.19X90 (BLADE) ×3 IMPLANT
BLADE SAW SGTL 13X75X1.27 (BLADE) ×3 IMPLANT
BNDG ELASTIC 4X5.8 VLCR NS LF (GAUZE/BANDAGES/DRESSINGS) ×3 IMPLANT
BNDG ELASTIC 4X5.8 VLCR STR LF (GAUZE/BANDAGES/DRESSINGS) ×3 IMPLANT
BNDG ELASTIC 6X10 VLCR STRL LF (GAUZE/BANDAGES/DRESSINGS) ×3 IMPLANT
BNDG ESMARK 6X9 LF (GAUZE/BANDAGES/DRESSINGS) ×3
BOWL SMART MIX CTS (DISPOSABLE) ×4 IMPLANT
CEMENT HV SMART SET (Cement) ×5 IMPLANT
COVER SURGICAL LIGHT HANDLE (MISCELLANEOUS) ×3 IMPLANT
CUFF TOURN SGL QUICK 34 (TOURNIQUET CUFF) ×3
CUFF TOURN SGL QUICK 42 (TOURNIQUET CUFF) IMPLANT
CUFF TRNQT CYL 34X4.125X (TOURNIQUET CUFF) ×2 IMPLANT
DRAPE ORTHO SPLIT 77X108 STRL (DRAPES) ×6
DRAPE SURG ORHT 6 SPLT 77X108 (DRAPES) ×4 IMPLANT
DRAPE U-SHAPE 47X51 STRL (DRAPES) ×3 IMPLANT
DURAPREP 26ML APPLICATOR (WOUND CARE) ×6 IMPLANT
ELECT REM PT RETURN 9FT ADLT (ELECTROSURGICAL) ×3
ELECTRODE REM PT RTRN 9FT ADLT (ELECTROSURGICAL) ×2 IMPLANT
FACESHIELD WRAPAROUND (MASK) ×6 IMPLANT
FACESHIELD WRAPAROUND OR TEAM (MASK) ×4 IMPLANT
GLOVE SRG 8 PF TXTR STRL LF DI (GLOVE) ×4 IMPLANT
GLOVE SURG ORTHO LTX SZ7.5 (GLOVE) ×6 IMPLANT
GLOVE SURG UNDER POLY LF SZ8 (GLOVE) ×6
GOWN STRL REUS W/ TWL LRG LVL3 (GOWN DISPOSABLE) ×2 IMPLANT
GOWN STRL REUS W/ TWL XL LVL3 (GOWN DISPOSABLE) ×2 IMPLANT
GOWN STRL REUS W/TWL 2XL LVL3 (GOWN DISPOSABLE) ×3 IMPLANT
GOWN STRL REUS W/TWL LRG LVL3 (GOWN DISPOSABLE) ×3
GOWN STRL REUS W/TWL XL LVL3 (GOWN DISPOSABLE) ×3
HANDPIECE INTERPULSE COAX TIP (DISPOSABLE) ×3
IMMOBILIZER KNEE 22 UNIV (SOFTGOODS) ×3 IMPLANT
KIT BASIN OR (CUSTOM PROCEDURE TRAY) ×3 IMPLANT
KIT TURNOVER KIT B (KITS) ×3 IMPLANT
MANIFOLD NEPTUNE II (INSTRUMENTS) ×3 IMPLANT
MARKER SKIN DUAL TIP RULER LAB (MISCELLANEOUS) ×3 IMPLANT
NDL 18GX1X1/2 (RX/OR ONLY) (NEEDLE) ×2 IMPLANT
NDL HYPO 25GX1X1/2 BEV (NEEDLE) ×2 IMPLANT
NEEDLE 18GX1X1/2 (RX/OR ONLY) (NEEDLE) ×3 IMPLANT
NEEDLE HYPO 25GX1X1/2 BEV (NEEDLE) ×3 IMPLANT
NS IRRIG 1000ML POUR BTL (IV SOLUTION) ×3 IMPLANT
PACK TOTAL JOINT (CUSTOM PROCEDURE TRAY) ×3 IMPLANT
PAD ARMBOARD 7.5X6 YLW CONV (MISCELLANEOUS) ×6 IMPLANT
PADDING CAST COTTON 6X4 STRL (CAST SUPPLIES) ×3 IMPLANT
SET HNDPC FAN SPRY TIP SCT (DISPOSABLE) ×2 IMPLANT
SPONGE T-LAP 18X18 ~~LOC~~+RFID (SPONGE) ×2 IMPLANT
STAPLER VISISTAT 35W (STAPLE) ×1 IMPLANT
SUCTION FRAZIER HANDLE 10FR (MISCELLANEOUS) ×3
SUCTION TUBE FRAZIER 10FR DISP (MISCELLANEOUS) ×2 IMPLANT
SUT VIC AB 0 CT1 27 (SUTURE) ×3
SUT VIC AB 0 CT1 27XBRD ANBCTR (SUTURE) ×2 IMPLANT
SUT VIC AB 1 CTX 36 (SUTURE) ×6
SUT VIC AB 1 CTX36XBRD ANBCTR (SUTURE) ×4 IMPLANT
SUT VIC AB 2-0 CT1 27 (SUTURE) ×6
SUT VIC AB 2-0 CT1 TAPERPNT 27 (SUTURE) ×4 IMPLANT
SYR 50ML LL SCALE MARK (SYRINGE) ×3 IMPLANT
SYR CONTROL 10ML LL (SYRINGE) ×3 IMPLANT
TIBIAL BASE ROT PLAT SZ 5 KNEE (Knees) ×3 IMPLANT
TOWEL GREEN STERILE (TOWEL DISPOSABLE) ×3 IMPLANT
TOWEL GREEN STERILE FF (TOWEL DISPOSABLE) ×3 IMPLANT
TRAY CATH INTERMITTENT SS 16FR (CATHETERS) ×1 IMPLANT

## 2021-09-14 NOTE — Anesthesia Preprocedure Evaluation (Signed)
Anesthesia Evaluation  ?Patient identified by MRN, date of birth, ID band ?Patient awake ? ? ? ?Reviewed: ?Allergy & Precautions, NPO status , Patient's Chart, lab work & pertinent test results ? ?History of Anesthesia Complications ?Negative for: history of anesthetic complications ? ?Airway ?Mallampati: III ? ?TM Distance: >3 FB ?Neck ROM: Full ? ? ? Dental ? ?(+) Teeth Intact, Dental Advisory Given ?  ?Pulmonary ?neg pulmonary ROS,  ?  ?breath sounds clear to auscultation ? ? ? ? ? ? Cardiovascular ?hypertension, Pt. on medications ?(-) angina(-) Cardiac Stents and (-) CHF  ?Rhythm:Regular  ? ?  ?Neuro/Psych ?negative psych ROS  ? GI/Hepatic ?GERD  Medicated and Controlled,  ?Endo/Other  ?Hypothyroidism  ? Renal/GU ?Lab Results ?     Component                Value               Date                 ?     CREATININE               0.90                09/12/2021           ?  ? ?  ?Musculoskeletal ? ?(+) Arthritis ,  ? Abdominal ?  ?Peds ? Hematology ?negative hematology ROS ?(+) Lab Results ?     Component                Value               Date                 ?     WBC                      7.7                 09/12/2021           ?     HGB                      14.4                09/12/2021           ?     HCT                      44.3                09/12/2021           ?     MCV                      94.9                09/12/2021           ?     PLT                      272                 09/12/2021           ?   ?Anesthesia Other Findings ? ? Reproductive/Obstetrics ? ?  ? ? ? ? ? ? ? ? ? ? ? ? ? ?  ?  ? ? ? ? ? ? ? ? ?  Anesthesia Physical ?Anesthesia Plan ? ?ASA: 2 ? ?Anesthesia Plan: MAC, Regional and Spinal  ? ?Post-op Pain Management: Ofirmev IV (intra-op)*  ? ?Induction: Intravenous ? ?PONV Risk Score and Plan: 2 and Propofol infusion, Ondansetron and Treatment may vary due to age or medical condition ? ?Airway Management Planned: Nasal Cannula ? ?Additional Equipment:  None ? ?Intra-op Plan:  ? ?Post-operative Plan:  ? ?Informed Consent: I have reviewed the patients History and Physical, chart, labs and discussed the procedure including the risks, benefits and alternatives for the proposed anesthesia with the patient or authorized representative who has indicated his/her understanding and acceptance.  ? ? ? ?Dental advisory given ? ?Plan Discussed with: CRNA ? ?Anesthesia Plan Comments:   ? ? ? ? ? ? ?Anesthesia Quick Evaluation ? ?

## 2021-09-14 NOTE — Anesthesia Procedure Notes (Signed)
Anesthesia Regional Block: Adductor canal block  ? ?Pre-Anesthetic Checklist: , timeout performed,  Correct Patient, Correct Site, Correct Laterality,  Correct Procedure, Correct Position, site marked,  Risks and benefits discussed,  Surgical consent,  Pre-op evaluation,  At surgeon's request and post-op pain management ? ?Laterality: Left and Lower ? ?Prep: chloraprep     ?  ?Needles:  ?Injection technique: Single-shot ? ?  ? ? ?Needle Length: 9cm  ?Needle Gauge: 22  ? ? ? ?Additional Needles: ?Arrow? StimuQuik? ECHO Echogenic Stimulating PNB Needle ? ?Procedures:,,,, ultrasound used (permanent image in chart),,    ?Narrative:  ?Start time: 09/14/2021 12:44 PM ?End time: 09/14/2021 12:48 PM ?Injection made incrementally with aspirations every 5 mL. ? ?Performed by: Personally  ?Anesthesiologist: Oleta Mouse, MD ? ? ? ? ?

## 2021-09-14 NOTE — Progress Notes (Signed)
Orthopedic Tech Progress Note ?Patient Details:  ?Kelly Barajas ?09-28-1950 ?[id number] ? ?Patient could not reach 90 started showing discomfort around 60 degrees so I stopped it at that degree ? ?CPM Left Knee ?CPM Left Knee: On ?Left Knee Flexion (Degrees): 0 ?Left Knee Extension (Degrees): 60 ?Additional Comments: fresh ice ? ?Post Interventions ?Patient Tolerated: Fair ?Instructions Provided: Care of device ? ?Donald Pore ?09/14/2021, 5:53 PM ? ?

## 2021-09-14 NOTE — Anesthesia Postprocedure Evaluation (Signed)
Anesthesia Post Note ? ?Patient: Kelly Barajas ? ?Procedure(s) Performed: LEFT TOTAL KNEE ARTHROPLASTY (Left: Knee) ?KNEE INJECTION (Right: Knee) ? ?  ? ?Patient location during evaluation: PACU ?Anesthesia Type: Regional and Spinal ?Level of consciousness: awake and alert ?Pain management: pain level controlled ?Vital Signs Assessment: post-procedure vital signs reviewed and stable ?Respiratory status: spontaneous breathing, nonlabored ventilation, respiratory function stable and patient connected to nasal cannula oxygen ?Cardiovascular status: blood pressure returned to baseline and stable ?Postop Assessment: no apparent nausea or vomiting ?Anesthetic complications: no ? ? ?No notable events documented. ? ?Last Vitals:  ?Vitals:  ? 09/14/21 1626 09/14/21 1645  ?BP: (!) 105/93 114/63  ?Pulse: 73 66  ?Resp: 20 13  ?Temp:    ?SpO2: 97% 96%  ?  ?Last Pain:  ?Vitals:  ? 09/14/21 1645  ?TempSrc:   ?PainSc: 4   ? ? ?  ?  ?  ?  ?  ?  ? ?Jaylun Fleener ? ? ? ? ?

## 2021-09-14 NOTE — Op Note (Signed)
Preop diagnosis: Left knee primary osteoarthritis ? ?Postop diagnosis: Same ? ?Procedure: Left total knee arthroplasty. ? ?Surgeon: Rodell Perna, MD ? ?Assistant: Benjiman Core, PA-C medically necessary and present for the entire procedure ? ?Anesthesia spinal plus Exparel plus Marcaine with preoperative adductor block. ? ?Tourniquet 300 x 52 minutes. ? ?Implants:Implants ? ?CEMENT HV SMART SET - [id number] ? ?Inventory Item: CEMENT HV SMART SET Serial no.:  Model/Cat no.: [id number]  ?Implant name: CEMENT HV SMART SET - [id number] Laterality: Left Area: Knee  ?Manufacturer: Dewitt Hoes Date of Manufacture:    ?Action: Wasted Number Used: 2   ?Device Identifier:  Device Identifier Type:    ? ?CEMENT HV SMART SET - [id number] ? ?Inventory Item: CEMENT HV SMART SET Serial no.:  Model/Cat no.: [id number]  ?Implant name: CEMENT HV SMART SET - [id number] Laterality: Left Area: Knee  ?Manufacturer: Dewitt Hoes Date of Manufacture:    ?Action: Implanted Number Used: 1   ?Device Identifier:  Device Identifier Type:    ? ?ATTUNE PSRP INSR SZ6 5MM KNEE - [id number] ? ?Inventory Item: ATTUNE PSRP INSR SZ6 5MM KNEE Serial no.:  Model/Cat no.: [id number]  ?Implant name: ATTUNE PSRP INSR SZ6 5MM KNEE - [id number] Laterality: Left Area: Knee  ?Manufacturer: Dewitt Hoes Date of Manufacture:    ?Action: Implanted Number Used: 1   ?Device Identifier:  Device Identifier Type:    ? ?ATTUNE PS FEM LT SZ 6 CEM KNEE - [id number] ? ?Inventory Item: ATTUNE PS FEM LT SZ 6 CEM KNEE Serial no.:  Model/Cat no.: [id number]  ?Implant name: ATTUNE PS FEM LT SZ 6 CEM KNEE - [id number] Laterality: Left Area: Knee  ?Manufacturer: Dewitt Hoes Date of Manufacture:    ?Action: Implanted Number Used: 1   ?Device Identifier:  Device Identifier Type:    ? ?TIBIAL BASE ROT PLAT SZ 5 KNEE - [id number] ? ?Inventory Item: TIBIAL BASE ROT PLAT SZ 5 KNEE Serial no.:  Model/Cat no.: [id number]  ?Implant name: TIBIAL BASE ROT PLAT SZ 5 KNEE - [id number]  Laterality: Left Area: Knee  ?Manufacturer: Dewitt Hoes Date of Manufacture:    ?Action: Implanted Number Used: 1   ?Device Identifier:  Device Identifier Type:    ? ?ATTUNE MED DOME PAT 38MM KNEE - [id number] ? ?Inventory Item: ATTUNE MED DOME PAT 38MM KNEE Serial no.:  Model/Cat no.: [id number]  ?Implant name: ATTUNE MED DOME PAT 38MM KNEE - [id number] Laterality: Left Area: Knee  ?Manufacturer: Dewitt Hoes Date of Manufacture:    ?Action: Implanted Number Used: 1   ?Device Identifier:  Device Identifier Type:    ?Procedure: After induction of spinal anesthesia preoperative abductor block IV TXA IV Ancef DuraPrep proximal thigh tourniquet heel bump lateral post and DuraPrep the usual extremity sheets and drapes were applied.  Sterile skin marker Betadine Steri-Drape application and timeout procedure completed.  Leg was wrapped in Esmarch tourniquet inflated midline incision was made medial parapatellar incision after marking with a purple marker for later capsular repair.  10 mm resected off patella intramedullary hole made in the femur.  Initially 10 mm was cut due to the patient's varus and some erosive changes medial compartment we came back and cut additional 2 mm on both tibia and femur to allow 5 mm spacer block to fit with the knee in extension.  Chamfer cuts box cuts were made in the femur.  Femur was 6 and tibia was a size 5.  There is bone-on-bone changes medial compartment large posterior spurs which were resected and erosive wear in  the weightbearing portion of the joint.  Ligaments were drilled in the femur patella 3 peg holes were drilled.  Back to mixing the cement.  Cementing of the femur after tibia followed by placement of the permanent poly 5 mm rotating platform spacer and 38 mm 3 peg patellar dome clamped down securely all excessive cement was removed.  Exparel Marcaine was injected well cement was setting up.  Pulse lavage preparation of the bony been used.  After cement was  hardened 15 minutes tourniquet was deflated hemostasis obtained and then standard layered closure closure of the deep fascia medial retinaculum with interrupted sutures 2-0 Vicryl subtenons tissue skin staple closure postop dressing and knee immobilizer. ?

## 2021-09-14 NOTE — Interval H&P Note (Signed)
History and Physical Interval Note: ? ?09/14/2021 ?12:40 PM ? ?Kelly Barajas  has presented today for surgery, with the diagnosis of BILATERAL knee osteoarthritis.  The various methods of treatment have been discussed with the patient and family. After consideration of risks, benefits and other options for treatment, the patient has consented to  Procedure(s): ?LEFT TOTAL KNEE ARTHROPLASTY (Left) ?KNEE INJECTION (Right) as a surgical intervention.  The patient's history has been reviewed, patient examined, no change in status, stable for surgery.  I have reviewed the patient's chart and labs.  Questions were answered to the patient's satisfaction.   ? ? ?Eldred Manges ? ? ?

## 2021-09-14 NOTE — Anesthesia Procedure Notes (Signed)
Spinal ? ?Patient location during procedure: OR ?Start time: 09/14/2021 1:23 PM ?End time: 09/14/2021 1:30 PM ?Reason for block: surgical anesthesia ?Staffing ?Performed: anesthesiologist  ?Anesthesiologist: Oleta Mouse, MD ?Preanesthetic Checklist ?Completed: patient identified, IV checked, risks and benefits discussed, surgical consent, monitors and equipment checked, pre-op evaluation and timeout performed ?Spinal Block ?Patient position: sitting ?Prep: DuraPrep ?Patient monitoring: heart rate, cardiac monitor, continuous pulse ox and blood pressure ?Approach: midline ?Location: L4-5 ?Injection technique: single-shot ?Needle ?Needle type: Pencan  ?Needle gauge: 24 G ?Needle length: 9 cm ?Assessment ?Sensory level: T6 ?Events: CSF return ? ? ? ?

## 2021-09-14 NOTE — Transfer of Care (Signed)
Immediate Anesthesia Transfer of Care Note ? ?Patient: Kelly Barajas ? ?Procedure(s) Performed: LEFT TOTAL KNEE ARTHROPLASTY (Left: Knee) ?KNEE INJECTION (Right: Knee) ? ?Patient Location: PACU ? ?Anesthesia Type:MAC, Regional and Spinal ? ?Level of Consciousness: awake and patient cooperative ? ?Airway & Oxygen Therapy: Patient Spontanous Breathing ? ?Post-op Assessment: Report given to RN and Post -op Vital signs reviewed and stable ? ?Post vital signs: Reviewed and stable ? ?Last Vitals:  ?Vitals Value Taken Time  ?BP 98/56 09/14/21 1512  ?Temp    ?Pulse 81 09/14/21 1513  ?Resp 18 09/14/21 1513  ?SpO2 98 % 09/14/21 1513  ?Vitals shown include unvalidated device data. ? ?Last Pain:  ?Vitals:  ? 09/14/21 1052  ?TempSrc:   ?PainSc: 0-No pain  ?   ? ?  ? ?Complications: No notable events documented. ?

## 2021-09-15 ENCOUNTER — Encounter (HOSPITAL_COMMUNITY): Payer: Self-pay | Admitting: Orthopaedic Surgery

## 2021-09-15 DIAGNOSIS — M1712 Unilateral primary osteoarthritis, left knee: Secondary | ICD-10-CM | POA: Diagnosis present

## 2021-09-15 DIAGNOSIS — Z79899 Other long term (current) drug therapy: Secondary | ICD-10-CM | POA: Diagnosis not present

## 2021-09-15 DIAGNOSIS — Z83438 Family history of other disorder of lipoprotein metabolism and other lipidemia: Secondary | ICD-10-CM | POA: Diagnosis not present

## 2021-09-15 DIAGNOSIS — K219 Gastro-esophageal reflux disease without esophagitis: Secondary | ICD-10-CM | POA: Diagnosis present

## 2021-09-15 DIAGNOSIS — Z7982 Long term (current) use of aspirin: Secondary | ICD-10-CM | POA: Diagnosis not present

## 2021-09-15 DIAGNOSIS — Z8261 Family history of arthritis: Secondary | ICD-10-CM | POA: Diagnosis not present

## 2021-09-15 DIAGNOSIS — T402X5A Adverse effect of other opioids, initial encounter: Secondary | ICD-10-CM | POA: Diagnosis not present

## 2021-09-15 DIAGNOSIS — I10 Essential (primary) hypertension: Secondary | ICD-10-CM | POA: Diagnosis present

## 2021-09-15 DIAGNOSIS — M170 Bilateral primary osteoarthritis of knee: Secondary | ICD-10-CM | POA: Diagnosis present

## 2021-09-15 DIAGNOSIS — Z6835 Body mass index (BMI) 35.0-35.9, adult: Secondary | ICD-10-CM | POA: Diagnosis not present

## 2021-09-15 DIAGNOSIS — M8580 Other specified disorders of bone density and structure, unspecified site: Secondary | ICD-10-CM | POA: Diagnosis present

## 2021-09-15 DIAGNOSIS — Z9109 Other allergy status, other than to drugs and biological substances: Secondary | ICD-10-CM | POA: Diagnosis not present

## 2021-09-15 DIAGNOSIS — E669 Obesity, unspecified: Secondary | ICD-10-CM | POA: Diagnosis present

## 2021-09-15 DIAGNOSIS — Z79890 Hormone replacement therapy (postmenopausal): Secondary | ICD-10-CM | POA: Diagnosis not present

## 2021-09-15 DIAGNOSIS — Z8249 Family history of ischemic heart disease and other diseases of the circulatory system: Secondary | ICD-10-CM | POA: Diagnosis not present

## 2021-09-15 DIAGNOSIS — Z818 Family history of other mental and behavioral disorders: Secondary | ICD-10-CM | POA: Diagnosis not present

## 2021-09-15 DIAGNOSIS — R112 Nausea with vomiting, unspecified: Secondary | ICD-10-CM | POA: Diagnosis not present

## 2021-09-15 DIAGNOSIS — E039 Hypothyroidism, unspecified: Secondary | ICD-10-CM | POA: Diagnosis present

## 2021-09-15 DIAGNOSIS — Z833 Family history of diabetes mellitus: Secondary | ICD-10-CM | POA: Diagnosis not present

## 2021-09-15 DIAGNOSIS — Z800 Family history of malignant neoplasm of digestive organs: Secondary | ICD-10-CM | POA: Diagnosis not present

## 2021-09-15 DIAGNOSIS — Z20822 Contact with and (suspected) exposure to covid-19: Secondary | ICD-10-CM | POA: Diagnosis present

## 2021-09-15 LAB — CBC
HCT: 35.9 % — ABNORMAL LOW (ref 36.0–46.0)
Hemoglobin: 12.1 g/dL (ref 12.0–15.0)
MCH: 31.3 pg (ref 26.0–34.0)
MCHC: 33.7 g/dL (ref 30.0–36.0)
MCV: 93 fL (ref 80.0–100.0)
Platelets: 215 10*3/uL (ref 150–400)
RBC: 3.86 MIL/uL — ABNORMAL LOW (ref 3.87–5.11)
RDW: 13.2 % (ref 11.5–15.5)
WBC: 12.1 10*3/uL — ABNORMAL HIGH (ref 4.0–10.5)
nRBC: 0 % (ref 0.0–0.2)

## 2021-09-15 LAB — BASIC METABOLIC PANEL
Anion gap: 4 — ABNORMAL LOW (ref 5–15)
BUN: 13 mg/dL (ref 8–23)
CO2: 25 mmol/L (ref 22–32)
Calcium: 8.6 mg/dL — ABNORMAL LOW (ref 8.9–10.3)
Chloride: 101 mmol/L (ref 98–111)
Creatinine, Ser: 0.8 mg/dL (ref 0.44–1.00)
GFR, Estimated: >60 mL/min (ref >60)
Glucose, Bld: 119 mg/dL — ABNORMAL HIGH (ref 70–99)
Potassium: 4.2 mmol/L (ref 3.5–5.1)
Sodium: 130 mmol/L — ABNORMAL LOW (ref 135–145)

## 2021-09-15 MED ORDER — ASPIRIN 325 MG PO TBEC: 325.0000 mg | DELAYED_RELEASE_TABLET | Freq: Every day | ORAL | 0 refills | Status: DC

## 2021-09-15 MED ORDER — METHOCARBAMOL 500 MG PO TABS: 500.0000 mg | ORAL_TABLET | Freq: Four times a day (QID) | ORAL | 0 refills | Status: DC | PRN

## 2021-09-15 MED ORDER — OXYCODONE-ACETAMINOPHEN 5-325 MG PO TABS: 1.0000 | ORAL_TABLET | ORAL | 0 refills | Status: DC | PRN

## 2021-09-15 NOTE — Progress Notes (Signed)
Subjective: ?Patient c/o nausea and vomiting. Poor appetite.  Pain controlled left knee.  Has not ambulated in hall.    ? ?Objective: ?Vital signs in last 24 hours: ?Temp:  [97 ?F (36.1 ?C)-98.6 ?F (37 ?C)] 98.6 ?F (37 ?C) (04/06 [id number]) ?Pulse Rate:  [64-84] 75 (04/06 0843) ?Resp:  [12-24] 17 (04/06 0843) ?BP: (98-139)/(56-96) 122/58 (04/06 0843) ?SpO2:  [90 %-100 %] 90 % (04/06 0843) ? ?Intake/Output from previous day: ?04/05 0701 - 04/06 0700 ?In: 2072 [P.O.:477; I.V.:1595] ?Out: 400 [Urine:375; Blood:25] ?Intake/Output this shift: ?No intake/output data recorded. ? ?Recent Labs  ?  09/12/21 ?1513 09/15/21 ?[id number]  ?HGB 14.4 12.1  ? ?Recent Labs  ?  09/12/21 ?1513 09/15/21 ?[id number]  ?WBC 7.7 12.1*  ?RBC 4.67 3.86*  ?HCT 44.3 35.9*  ?PLT 272 215  ? ?Recent Labs  ?  09/12/21 ?1513 09/15/21 ?[id number]  ?NA 138 130*  ?K 4.1 4.2  ?CL 105 101  ?CO2 27 25  ?BUN 17 13  ?CREATININE 0.90 0.80  ?GLUCOSE 112* 119*  ?CALCIUM 10.0 8.6*  ? ?No results for input(s): LABPT, INR in the last 72 hours. ? ?Exam: ?Pleasant female, alert and oriented.  Dressing clean, dry, intact.   ? ? ? ?Assessment/Plan: ?D/c dilaudid.  Likely contributing to N/V.  ?Will see about d/c home tomorrow depending on how patient is progressing.  ? ? ? ? ?Benjiman Core ?09/15/2021, 12:38 PM  ? ? ? ? ?

## 2021-09-15 NOTE — Evaluation (Signed)
Physical Therapy Evaluation ?Patient Details ?Name: Kelly Barajas ?MRN: [id number] ?DOB: May 07, 1951 ?Today's Date: 09/15/2021 ? ?History of Present Illness ? Patient is a 71 y/o female admitted for L TKA with PMH positive for HTN, hypothyroid, GERD and OA.  ?Clinical Impression ? Patient presents with decreased mobility due to deficits listed in PT problem list.  Currently ambulatory in the room with minguard A and min a for bed mobility, though overall limited by N&V following ambulation.  She returned to bed and RN made aware.  Feel she will benefit from skilled PT in the acute setting to allow d/c home with family support and follow up as per MD.    ?   ? ?Recommendations for follow up therapy are one component of a multi-disciplinary discharge planning process, led by the attending physician.  Recommendations may be updated based on patient status, additional functional criteria and insurance authorization. ? ?Follow Up Recommendations Follow physician's recommendations for discharge plan and follow up therapies ? ?  ?Assistance Recommended at Discharge Intermittent Supervision/Assistance  ?Patient can return home with the following ? A little help with walking and/or transfers;A little help with bathing/dressing/bathroom;Assistance with cooking/housework;Assist for transportation;Help with stairs or ramp for entrance ? ?  ?Equipment Recommendations Rolling walker (2 wheels)  ?Recommendations for Other Services ?    ?  ?Functional Status Assessment Patient has had a recent decline in their functional status and demonstrates the ability to make significant improvements in function in a reasonable and predictable amount of time.  ? ?  ?Precautions / Restrictions Precautions ?Precautions: Fall;Knee ?Restrictions ?Weight Bearing Restrictions: Yes ?LLE Weight Bearing: Weight bearing as tolerated  ? ?  ? ?Mobility ? Bed Mobility ?Overal bed mobility: Needs Assistance ?Bed Mobility: Supine to Sit, Sit to Supine ?  ?   ?Supine to sit: Min assist, HOB elevated ?Sit to supine: Min assist ?  ?General bed mobility comments: assist for leg and cues for technique ?  ? ?Transfers ?Overall transfer level: Needs assistance ?Equipment used: Rolling walker (2 wheels) ?Transfers: Sit to/from Stand ?Sit to Stand: Min assist ?  ?  ?  ?  ?  ?General transfer comment: cue for hand placement ?  ? ?Ambulation/Gait ?Ambulation/Gait assistance: Min guard, Supervision ?Gait Distance (Feet): 22 Feet ?Assistive device: Rolling walker (2 wheels) ?Gait Pattern/deviations: Step-to pattern, Antalgic, Decreased stride length ?  ?  ?  ?General Gait Details: cues for technique and walker management, assist for balance and sequence on turns, used side stepping technique at narrow spot by the [facility] and bed to simulate home navigation ? ?Stairs ?  ?  ?  ?  ?  ? ?Wheelchair Mobility ?  ? ?Modified Rankin (Stroke Patients Only) ?  ? ?  ? ?Balance Overall balance assessment: Mild deficits observed, not formally tested ?  ?  ?  ?  ?  ?  ?  ?  ?  ?  ?  ?  ?  ?  ?  ?  ?  ?  ?   ? ? ? ?Pertinent Vitals/Pain Pain Assessment ?Pain Assessment: 0-10 ?Pain Score: 6  ?Pain Location: L knee with movement ?Pain Descriptors / Indicators: Aching, Grimacing, Guarding ?Pain Intervention(s): Monitored during session, Repositioned, Premedicated before session  ? ? ?Home Living Family/patient expects to be discharged to:: Private residence ?Living Arrangements: Alone ?Available Help at Discharge: Family;Available 24 hours/day ?Type of Home: House ?Home Access: Stairs to enter ?Entrance Stairs-Rails: Right ?Entrance Stairs-Number of Steps: 4 ?  ?Home Layout: One level ?  Home Equipment: None ?   ?  ?Prior Function Prior Level of Function : Independent/Modified Independent ?  ?  ?  ?  ?  ?  ?  ?  ?  ? ? ?Hand Dominance  ? Dominant Hand: Right ? ?  ?Extremity/Trunk Assessment  ? Upper Extremity Assessment ?Upper Extremity Assessment: Overall WFL for tasks assessed ?  ? ?Lower Extremity  Assessment ?Lower Extremity Assessment: LLE deficits/detail ?LLE Deficits / Details: AAROM knee limited to about 30 degrees in supine, strength at least 2/4 knee extension, and 3-/5 hip flexion ?  ? ?   ?Communication  ? Communication: No difficulties  ?Cognition Arousal/Alertness: Awake/alert ?Behavior During Therapy: [facility] for tasks assessed/performed ?Overall Cognitive Status: Within Functional Limits for tasks assessed ?  ?  ?  ?  ?  ?  ?  ?  ?  ?  ?  ?  ?  ?  ?  ?  ?  ?  ?  ? ?  ?General Comments General comments (skin integrity, edema, etc.): ace wrap tight on leg loosened with improved quad activation ? ?  ?Exercises Total Joint Exercises ?Ankle Circles/Pumps: AROM, 10 reps, Both, Supine ?Quad Sets: AROM, Both, Supine, 10 reps ?Short Arc Quad: AROM, Left, 5 reps, Supine ?Heel Slides: AROM, AAROM, Left, 10 reps, Supine ?Hip ABduction/ADduction: AROM, Left, 10 reps, Supine ?Straight Leg Raises: AROM, AAROM, Left, 10 reps, Supine  ? ?Assessment/Plan  ?  ?PT Assessment Patient needs continued PT services  ?PT Problem List Decreased mobility;Decreased range of motion;Decreased activity tolerance;Decreased knowledge of use of DME;Pain;Decreased safety awareness ? ?   ?  ?PT Treatment Interventions DME instruction;Therapeutic activities;Patient/family education;Therapeutic exercise;Gait training;Stair training;Functional mobility training   ? ?PT Goals (Current goals can be found in the Care Plan section)  ?Acute Rehab PT Goals ?Patient Stated Goal: to go home, return to independent ?PT Goal Formulation: With patient ?Time For Goal Achievement: 09/29/21 ?Potential to Achieve Goals: Good ? ?  ?Frequency 7X/week ?  ? ? ?Co-evaluation   ?  ?  ?  ?  ? ? ?  ?AM-PAC PT "6 Clicks" Mobility  ?Outcome Measure Help needed turning from your back to your side while in a flat bed without using bedrails?: A Little ?Help needed moving from lying on your back to sitting on the side of a flat bed without using bedrails?: A  Little ?Help needed moving to and from a bed to a chair (including a wheelchair)?: A Little ?Help needed standing up from a chair using your arms (e.g., wheelchair or bedside chair)?: A Little ?Help needed to walk in hospital room?: A Little ?Help needed climbing 3-5 steps with a railing? : Total ?6 Click Score: 16 ? ?  ?End of Session Equipment Utilized During Treatment: Gait belt;Left knee immobilizer ?Activity Tolerance: Other (comment) (pt with N&V after ambulation) ?Patient left: in bed;with call bell/phone within reach ?Nurse Communication: Other (comment) (pt with N&V) ?PT Visit Diagnosis: Difficulty in walking, not elsewhere classified (R26.2);Pain ?Pain - Right/Left: Left ?Pain - part of body: Knee ?  ? ?Time: 0925-1010 ?PT Time Calculation (min) (ACUTE ONLY): 45 min ? ? ?Charges:   PT Evaluation ?$PT Eval Moderate Complexity: 1 Mod ?PT Treatments ?$Gait Training: 8-22 mins ?$Therapeutic Exercise: 8-22 mins ?  ?   ? ? ?Sheran Lawless, PT ?Acute Rehabilitation Services ?[phone] ?[phone] ?09/15/2021 ? ? ?Kelly Barajas ?09/15/2021, 1:36 PM ? ?

## 2021-09-15 NOTE — Plan of Care (Signed)
?  Problem: Clinical Measurements: ?Goal: Ability to maintain clinical measurements within normal limits will improve ?Outcome: Progressing ?Goal: Will remain free from infection ?Outcome: Progressing ?Goal: Diagnostic test results will improve ?Outcome: Progressing ?Goal: Respiratory complications will improve ?Outcome: Progressing ?Goal: Cardiovascular complication will be avoided ?Outcome: Progressing ?  ?Problem: Elimination: ?Goal: Will not experience complications related to bowel motility ?Outcome: Progressing ?Goal: Will not experience complications related to urinary retention ?Outcome: Progressing ?  ?Problem: Pain Managment: ?Goal: General experience of comfort will improve ?Outcome: Progressing ?  ?Problem: Safety: ?Goal: Ability to remain free from injury will improve ?Outcome: Progressing ?  ?Problem: Skin Integrity: ?Goal: Risk for impaired skin integrity will decrease ?Outcome: Progressing ?  ?Problem: Education: ?Goal: Knowledge of the prescribed therapeutic regimen will improve ?Outcome: Progressing ?Goal: Individualized Educational Video(s) ?Outcome: Progressing ?  ?Problem: Activity: ?Goal: Ability to avoid complications of mobility impairment will improve ?Outcome: Progressing ?Goal: Range of joint motion will improve ?Outcome: Progressing ?  ?Problem: Clinical Measurements: ?Goal: Postoperative complications will be avoided or minimized ?Outcome: Progressing ?  ?Problem: Pain Management: ?Goal: Pain level will decrease with appropriate interventions ?Outcome: Progressing ?  ?Problem: Skin Integrity: ?Goal: Will show signs of wound healing ?Outcome: Progressing ?  ?

## 2021-09-15 NOTE — Discharge Instructions (Signed)
INSTRUCTIONS AFTER JOINT REPLACEMENT  ? ?Remove items at home which could result in a fall. This includes throw rugs or furniture in walking pathways ?ICE to the affected joint every three hours while awake for 30 minutes at a time, for at least the first 3-5 days, and then as needed for pain and swelling.  Continue to use ice for pain and swelling. You may notice swelling that will progress down to the foot and ankle.  This is normal after surgery.  Elevate your leg when you are not up walking on it.   ?Continue to use the breathing machine you got in the hospital (incentive spirometer) which will help keep your temperature down.  It is common for your temperature to cycle up and down following surgery, especially at night when you are not up moving around and exerting yourself.  The breathing machine keeps your lungs expanded and your temperature down. ? ? ?DIET:  As you were doing prior to hospitalization, we recommend a well-balanced diet. ? ?DRESSING / WOUND CARE / SHOWERING ? ?You may change your dressing 3-5 days after surgery.  Then change the dressing every day with sterile gauze.  Please use good hand washing techniques before changing the dressing.  Do not use any lotions or creams on the incision until instructed by your surgeon. ? ?ACTIVITY ? ?Increase activity slowly as tolerated, but follow the weight bearing instructions below.   ?No driving for 6 weeks or until further direction given by your physician.  You cannot drive while taking narcotics.  ?No lifting or carrying greater than 10 lbs. until further directed by your surgeon. ?Avoid periods of inactivity such as sitting longer than an hour when not asleep. This helps prevent blood clots.  ?You may return to work once you are authorized by your doctor.  ? ? ? ?WEIGHT BEARING  ? ?Weight bearing as tolerated with assist device (walker, cane, etc) as directed, use it as long as suggested by your surgeon or therapist, typically at least 4-6 weeks.   Home health therapist will wean you out of knee immobilizer as quad strength and gait improve ? ? ?EXERCISES ? ?Results after joint replacement surgery are often greatly improved when you follow the exercise, range of motion and muscle strengthening exercises prescribed by your doctor. Safety measures are also important to protect the joint from further injury. Any time any of these exercises cause you to have increased pain or swelling, decrease what you are doing until you are comfortable again and then slowly increase them. If you have problems or questions, call your caregiver or physical therapist for advice.  ? ?Rehabilitation is important following a joint replacement. After just a few days of immobilization, the muscles of the leg can become weakened and shrink (atrophy).  These exercises are designed to build up the tone and strength of the thigh and leg muscles and to improve motion. Often times heat used for twenty to thirty minutes before working out will loosen up your tissues and help with improving the range of motion but do not use heat for the first two weeks following surgery (sometimes heat can increase post-operative swelling).  ? ?These exercises can be done on a training (exercise) mat, on the floor, on a table or on a bed. Use whatever works the best and is most comfortable for you.    Use music or television while you are exercising so that the exercises are a pleasant break in your day. This will make your  life better with the exercises acting as a break in your routine that you can look forward to.   Perform all exercises about fifteen times, three times per day or as directed.  You should exercise both the operative leg and the other leg as well. ? ?Exercises include: ?  ?Quad Sets - Tighten up the muscle on the front of the thigh (Quad) and hold for 5-10 seconds.   ?Straight Leg Raises - With your knee straight (if you were given a brace, keep it on), lift the leg to 60 degrees, hold for  3 seconds, and slowly lower the leg.  Perform this exercise against resistance later as your leg gets stronger.  ?Leg Slides: Lying on your back, slowly slide your foot toward your buttocks, bending your knee up off the floor (only go as far as is comfortable). Then slowly slide your foot back down until your leg is flat on the floor again.  ?Angel Wings: Lying on your back spread your legs to the side as far apart as you can without causing discomfort.  ?Hamstring Strength:  Lying on your back, push your heel against the floor with your leg straight by tightening up the muscles of your buttocks.  Repeat, but this time bend your knee to a comfortable angle, and push your heel against the floor.  You may put a pillow under the heel to make it more comfortable if necessary.  ? ?A rehabilitation program following joint replacement surgery can speed recovery and prevent re-injury in the future due to weakened muscles. Contact your doctor or a physical therapist for more information on knee rehabilitation.  ? ? ?CONSTIPATION ? ?Constipation is defined medically as fewer than three stools per week and severe constipation as less than one stool per week.  Even if you have a regular bowel pattern at home, your normal regimen is likely to be disrupted due to multiple reasons following surgery.  Combination of anesthesia, postoperative narcotics, change in appetite and fluid intake all can affect your bowels.  ? ?YOU MUST use at least one of the following options; they are listed in order of increasing strength to get the job done.  They are all available over the counter, and you may need to use some, POSSIBLY even all of these options:   ? ?Drink plenty of fluids (prune juice may be helpful) and high fiber foods ?Colace 100 mg by mouth twice a day  ?Senokot for constipation as directed and as needed Dulcolax (bisacodyl), take with full glass of water  ?Miralax (polyethylene glycol) once or twice a day as needed. ? ?If you  have tried all these things and are unable to have a bowel movement in the first 3-4 days after surgery call either your surgeon or your primary doctor.   ? ?If you experience loose stools or diarrhea, hold the medications until you stool forms back up.  If your symptoms do not get better within 1 week or if they get worse, check with your doctor.  If you experience "the worst abdominal pain ever" or develop nausea or vomiting, please contact the office immediately for further recommendations for treatment. ? ? ?ITCHING:  If you experience itching with your medications, try taking only a single pain pill, or even half a pain pill at a time.  You can also use Benadryl over the counter for itching or also to help with sleep.  ? ?TED HOSE STOCKINGS:  Use stockings on both legs until for at least  2 weeks or as directed by physician office. They may be removed at night for sleeping. ? ?MEDICATIONS:  See your medication summary on the ?After Visit Summary? that nursing will review with you.  You may have some home medications which will be placed on hold until you complete the course of blood thinner medication.  It is important for you to complete the blood thinner medication as prescribed. ? ?PRECAUTIONS:  If you experience chest pain or shortness of breath - call 911 immediately for transfer to the hospital emergency department.  ? ?If you develop a fever greater that 101 F, purulent drainage from wound, increased redness or drainage from wound, foul odor from the wound/dressing, or calf pain - CONTACT YOUR SURGEON.   ?                                                ?FOLLOW-UP APPOINTMENTS:  If you do not already have a post-op appointment, please call the office for an appointment to be seen by your surgeon.  Guidelines for how soon to be seen are listed in your ?After Visit Summary?, but are typically between 1-4 weeks after surgery. ? ?OTHER INSTRUCTIONS:  ? ?Knee Replacement:  Do not place pillow under knee, focus  on keeping the knee straight while resting. CPM instructions: 0-90 degrees, 2 hours in the morning, 2 hours in the afternoon, and 2 hours in the evening. Place foam block, curve side up under heel at all

## 2021-09-15 NOTE — Progress Notes (Addendum)
Physical Therapy Treatment ?Patient Details ?Name: Kelly Barajas ?MRN: [id number] ?DOB: [birth year] ?Today's Date: 09/15/2021 ? ? ?History of Present Illness Patient is a 71 y/o female admitted for L TKA with PMH positive for HTN, hypothyroid, GERD and OA. ? ?  ?PT Comments  ? ? Progressed to hallway ambulation this pm, though with UE fatigue (may need shorter walker).  She denied nausea and seemed to tolerate weight bearing a little better.  PT will continue to follow and attempt steps next session.   ?Recommendations for follow up therapy are one component of a multi-disciplinary discharge planning process, led by the attending physician.  Recommendations may be updated based on patient status, additional functional criteria and insurance authorization. ? ?Follow Up Recommendations ? Follow physician's recommendations for discharge plan and follow up therapies ?  ?  ?Assistance Recommended at Discharge Intermittent Supervision/Assistance  ?Patient can return home with the following A little help with walking and/or transfers;A little help with bathing/dressing/bathroom;Assistance with cooking/housework;Assist for transportation;Help with stairs or ramp for entrance ?  ?Equipment Recommendations ? Rolling walker (2 wheels)  ?  ?Recommendations for Other Services   ? ? ?  ?Precautions / Restrictions Precautions ?Precautions: Fall;Knee ?Restrictions ?LLE Weight Bearing: Weight bearing as tolerated  ?  ? ?Mobility ? Bed Mobility ?Overal bed mobility: Needs Assistance ?Bed Mobility: Sit to Supine ?  ?  ?Supine to sit: Min assist, HOB elevated ?Sit to supine: Min assist ?  ?General bed mobility comments: educated in cross leg technique, but too painful so assisted L LE into bed ?  ? ?Transfers ?Overall transfer level: Needs assistance ?Equipment used: Rolling walker (2 wheels) ?Transfers: Sit to/from Stand ?Sit to Stand: Min guard ?  ?  ?  ?  ?  ?General transfer comment: up from recliner and toilet in bathroom with  assist for balance ?  ? ?Ambulation/Gait ?Ambulation/Gait assistance: Supervision, Min guard ?Gait Distance (Feet): 120 Feet ?Assistive device: Rolling walker (2 wheels) ?Gait Pattern/deviations: Step-to pattern, Step-through pattern, Decreased stride length, Trunk flexed, Antalgic ?  ?  ?  ?General Gait Details: UE's fatigued with hallway ambulation, stops to rest arms x 2 ? ? ?Stairs ?  ?  ?  ?  ?  ? ? ?Wheelchair Mobility ?  ? ?Modified Rankin (Stroke Patients Only) ?  ? ? ?  ?Balance Overall balance assessment: Needs assistance ?  ?Sitting balance-Leahy Scale: Good ?  ?  ?Standing balance support: Bilateral upper extremity supported ?Standing balance-Leahy Scale: Poor ?Standing balance comment: washed hands leaning on elbows at the sink ?  ?  ?  ?  ?  ?  ?  ?  ?  ?  ?  ?  ? ?  ?Cognition  ?  ?  ?  ?  ?  ?  ?  ?  ?  ?  ?  ?  ?  ?  ?  ?  ?  ?  ?  ? ?  ?Exercises Total Joint Exercises ?Knee Flexion: AROM, 10 reps, Seated ? ?  ?General Comments General comments (skin integrity, edema, etc.): toileted in bathroom, independent with toilet hygiene. ?  ?  ? ?Pertinent Vitals/Pain Pain Assessment ?Pain Assessment: Faces ?Pain Score: 6  ?Faces Pain Scale: Hurts little more ?Pain Location: back of L knee ?Pain Descriptors / Indicators: Tightness ?Pain Intervention(s): Monitored during session, Repositioned  ? ? ?Home Living  ?   ?  ?Prior Function    ?  ?  ?   ? ?PT Goals (  current goals can now be found in the care plan section) Acute Rehab PT Goals ?Patient Stated Goal: to go home, return to independent ?PT Goal Formulation: With patient ?Time For Goal Achievement: 09/29/21 ?Potential to Achieve Goals: Good ?Progress towards PT goals: Progressing toward goals ? ?  ?Frequency ? ? ? 7X/week ? ? ? ?  ?PT Plan Current plan remains appropriate  ? ? ?Co-evaluation   ?  ?  ?  ?  ? ?  ?AM-PAC PT "6 Clicks" Mobility   ?Outcome Measure ? Help needed turning from your back to your side while in a flat bed without using bedrails?:  None ?Help needed moving from lying on your back to sitting on the side of a flat bed without using bedrails?: A Little ?Help needed moving to and from a bed to a chair (including a wheelchair)?: A Little ?Help needed standing up from a chair using your arms (e.g., wheelchair or bedside chair)?: A Little ?Help needed to walk in hospital room?: A Little ?Help needed climbing 3-5 steps with a railing? : Total ?6 Click Score: 17 ? ?  ?End of Session Equipment Utilized During Treatment: Gait belt;Left knee immobilizer ?Activity Tolerance: Patient tolerated treatment well ?Patient left: in bed;with call bell/phone within reach;with family/visitor present ?Nurse Communication: Other (comment) (pt with N&V) ?PT Visit Diagnosis: Difficulty in walking, not elsewhere classified (R26.2);Pain ?Pain - Right/Left: Left ?Pain - part of body: Knee ?  ? ? ?Time: [id number] ?PT Time Calculation (min) (ACUTE ONLY): 25 min ? ?Charges:  $Gait Training: 8-22 mins ?$Therapeutic Activity: 8-22 mins          ?          ? ?Magda Kiel, PT ?Acute Rehabilitation Services ?[id number] ?[phone] ?09/15/2021 ? ? ? ?Reginia Naas ?09/15/2021, 4:52 PM ? ?

## 2021-09-15 NOTE — Progress Notes (Signed)
Patient vomited with therapy this morning. Holding Morning medications until patient can feels better. Pain controlled at this time.  ?

## 2021-09-15 NOTE — Care Plan (Signed)
Ortho Bundle Case Management Note ? ?Patient Details  ?Name: Kelly Barajas ?MRN: [id number] ?Date of Birth: 1950-08-18 ? ?[facility] RNCM call to patient prior to her upcoming Left total knee arthroplasty with Dr. Ophelia Charter on 09/14/21. She is an Ortho bundle patient through [facility] and is agreeable to case management. She lives alone, but has a daughter who will be coming to assist her for a week post op, then a sister, who also will be available. She has a RW, 3in1/BSC, toilet riser, and shower chair in the home. No other DME needed. Anticipate HHPT will be needed after short hospital stay. Referral made to [facility] after choice provided. She requested OPPT be in [facility] location near her. CM will assist with scheduling when appropriate. Reviewed all post op care instructions. Will continue to follow for needs.               ? ? ? ?DME Arranged:   (Patient has a RW, 3in1/BSC, toilet riser, shower chair at home. No DME needed.) ?DME Agency:    ? ?HH Arranged:  PT ?HH Agency:  [facility] ? ?Additional Comments: ?Please contact me with any questions of if this plan should need to change. ? ?Ralph Dowdy, RN, BSN, [employer]  [phone] ?09/15/2021, 9:21 AM ?  ?

## 2021-09-16 LAB — CBC
HCT: 34.4 % — ABNORMAL LOW (ref 36.0–46.0)
Hemoglobin: 11.7 g/dL — ABNORMAL LOW (ref 12.0–15.0)
MCH: 31 pg (ref 26.0–34.0)
MCHC: 34 g/dL (ref 30.0–36.0)
MCV: 91 fL (ref 80.0–100.0)
Platelets: 211 10*3/uL (ref 150–400)
RBC: 3.78 MIL/uL — ABNORMAL LOW (ref 3.87–5.11)
RDW: 12.9 % (ref 11.5–15.5)
WBC: 11.2 10*3/uL — ABNORMAL HIGH (ref 4.0–10.5)
nRBC: 0 % (ref 0.0–0.2)

## 2021-09-16 NOTE — Progress Notes (Signed)
Physical Therapy Treatment ?Patient Details ?Name: Kelly Barajas ?MRN: [id number] ?DOB: [birth year] ?Today's Date: 09/16/2021 ? ? ?History of Present Illness 71 y/o female admitted for L TKA with PMH positive for HTN, hypothyroid, GERD and OA. ? ?  ?PT Comments  ? ? Pt was seen for PM session with notable control of L knee improved as well as pain management.  Her ability to get up to stand and to get off bed were improved, and was able to walk on the hallway with better quality of gait.  Pt is assisted to bed and provided her with a handout of home exercises and knee information.  Reviewed her basic routine, then pt received ice on knee.  Pt is again expecting to be home today but assured her she is set for visit in AM if this is not done today.  Reminder to contact nursing if she as questions upon return home that were forgotten.  ?Recommendations for follow up therapy are one component of a multi-disciplinary discharge planning process, led by the attending physician.  Recommendations may be updated based on patient status, additional functional criteria and insurance authorization. ? ?Follow Up Recommendations ? Follow physician's recommendations for discharge plan and follow up therapies ?  ?  ?Assistance Recommended at Discharge Intermittent Supervision/Assistance  ?Patient can return home with the following A little help with walking and/or transfers;A little help with bathing/dressing/bathroom;Assistance with cooking/housework;Assist for transportation;Help with stairs or ramp for entrance ?  ?Equipment Recommendations ? Rolling walker (2 wheels)  ?  ?Recommendations for Other Services   ? ? ?  ?Precautions / Restrictions Precautions ?Precautions: Fall;Knee ?Precaution Booklet Issued: Yes (comment) ?Required Braces or Orthoses: Knee Immobilizer - Left ?Knee Immobilizer - Left: On except when in CPM ?Restrictions ?Weight Bearing Restrictions: Yes ?LLE Weight Bearing: Weight bearing as tolerated  ?   ? ?Mobility ? Bed Mobility ?Overal bed mobility: Needs Assistance ?Bed Mobility: Supine to Sit ?  ?  ?Supine to sit: Min guard ?  ?  ?General bed mobility comments: pt is hurting in mobility to side of bed and used brace effectively to steady it ?  ? ?Transfers ?Overall transfer level: Needs assistance ?Equipment used: Rolling walker (2 wheels) ?Transfers: Sit to/from Stand ?Sit to Stand: Min guard ?  ?  ?  ?  ?  ?General transfer comment: correction of hand placement to ease transition ?  ? ?Ambulation/Gait ?Ambulation/Gait assistance: Min guard ?Gait Distance (Feet): 100 Feet ?Assistive device: Rolling walker (2 wheels) ?Gait Pattern/deviations: Step-to pattern, Step-through pattern, Decreased stride length, Trunk flexed, Antalgic ?Gait velocity: reduced ?Gait velocity interpretation: <1.31 ft/sec, indicative of household ambulator ?Pre-gait activities: standing balance ?General Gait Details: pt is taking her time and leading with LLE, fatigues but not excessively ? ? ?Stairs ?Stairs: Yes ?Stairs assistance: Min guard ?Stair Management: One rail Left, One rail Right, Step to pattern, Forwards ?Number of Stairs: 4 ?General stair comments: pt is working very hard to step up as LLE is painful as support limb but coming down more comfortable.  Per pt, she has used the sequence prior to her surgery as needed for joint pain ? ? ?Wheelchair Mobility ?  ? ?Modified Rankin (Stroke Patients Only) ?  ? ? ?  ?Balance Overall balance assessment: Needs assistance ?  ?Sitting balance-Leahy Scale: Good ?  ?  ?  ?Standing balance-Leahy Scale: Poor ?Standing balance comment: using walker better to control balance ?  ?  ?  ?  ?  ?  ?  ?  ?  ?  ?  ?  ? ?  ?  Cognition Arousal/Alertness: Awake/alert ?Behavior During Therapy: [facility] for tasks assessed/performed ?Overall Cognitive Status: Within Functional Limits for tasks assessed ?  ?  ?  ?  ?  ?  ?  ?  ?  ?  ?  ?  ?  ?  ?  ?  ?  ?  ?  ? ?  ?Exercises   ? ?  ?General Comments General  comments (skin integrity, edema, etc.): better pain management this PM with pt reporting no real issue to stand on L knee ?  ?  ? ?Pertinent Vitals/Pain Pain Assessment ?Pain Assessment: Faces ?Faces Pain Scale: Hurts little more ?Pain Location: L knee with mobility ?Pain Descriptors / Indicators: Guarding ?Pain Intervention(s): Limited activity within patient's tolerance, Monitored during session, Premedicated before session, Repositioned, Ice applied  ? ? ?Home Living   ?  ?  ?  ?  ?  ?  ?  ?  ?  ?   ?  ?Prior Function    ?  ?  ?   ? ?PT Goals (current goals can now be found in the care plan section) Acute Rehab PT Goals ?Patient Stated Goal: home and independent ?Progress towards PT goals: Progressing toward goals ? ?  ?Frequency ? ? ? 7X/week ? ? ? ?  ?PT Plan Current plan remains appropriate  ? ? ?Co-evaluation   ?  ?  ?  ?  ? ?  ?AM-PAC PT "6 Clicks" Mobility   ?Outcome Measure ? Help needed turning from your back to your side while in a flat bed without using bedrails?: None ?Help needed moving from lying on your back to sitting on the side of a flat bed without using bedrails?: A Little ?Help needed moving to and from a bed to a chair (including a wheelchair)?: A Little ?Help needed standing up from a chair using your arms (e.g., wheelchair or bedside chair)?: A Little ?Help needed to walk in hospital room?: A Little ?Help needed climbing 3-5 steps with a railing? : A Little ?6 Click Score: 19 ? ?  ?End of Session Equipment Utilized During Treatment: Gait belt;Left knee immobilizer ?Activity Tolerance: Patient tolerated treatment well;Patient limited by pain;Treatment limited secondary to medical complications (Comment) ?Patient left: in chair;with call bell/phone within reach;with chair alarm set ?Nurse Communication: Mobility status ?PT Visit Diagnosis: Difficulty in walking, not elsewhere classified (R26.2);Pain ?Pain - Right/Left: Left ?Pain - part of body: Knee ?  ? ? ?Time: 1335-1405 ?PT Time  Calculation (min) (ACUTE ONLY): 30 min ? ?Charges:  $Gait Training: 8-22 mins ?$Therapeutic Exercise: 8-22 mins    ?Ramond Dial ?09/16/2021, 2:11 PM ? ?Mee Hives, PT PhD ?Acute Rehab Dept. Number: [facility] [id number] and [place] [phone] ? ? ?

## 2021-09-16 NOTE — Progress Notes (Signed)
Subjective: ?Patient doing well.  Made good progress with PT.  Pain controlled.  No longer has  nausea or vomiting.  She is ready to discharge home today. ? ?Objective: ?Vital signs in last 24 hours: ?Temp:  [97.8 ?F (36.6 ?C)-98.5 ?F (36.9 ?C)] 97.8 ?F (36.6 ?C) (04/07 0749) ?Pulse Rate:  [62-100] 89 (04/07 0749) ?Resp:  [17-18] 18 (04/07 0749) ?BP: (139-144)/(65-70) 139/65 (04/07 0749) ?SpO2:  [90 %-92 %] 90 % (04/07 0559) ? ?Intake/Output from previous day: ?04/06 0701 - 04/07 0700 ?In: 412.4 [P.O.:240; I.V.:172.4] ?Out: 850 [Urine:850] ?Intake/Output this shift: ?No intake/output data recorded. ? ?Recent Labs  ?  09/15/21 ?4259 09/16/21 ?0130  ?HGB 12.1 11.7*  ? ?Recent Labs  ?  09/15/21 ?5638 09/16/21 ?0130  ?WBC 12.1* 11.2*  ?RBC 3.86* 3.78*  ?HCT 35.9* 34.4*  ?PLT 215 211  ? ?Recent Labs  ?  09/15/21 ?7564  ?NA 130*  ?K 4.2  ?CL 101  ?CO2 25  ?BUN 13  ?CREATININE 0.80  ?GLUCOSE 119*  ?CALCIUM 8.6*  ? ?No results for input(s): LABPT, INR in the last 72 hours. ? ?Exam ?Very pleasant female alert and oriented in no acute distress.  Family members in room.  Knee wound looks good.  Staples intact.  No drainage or signs infection.  Calf nontender. ? ? ? ?Assessment/Plan: ?Discharge home today.  Scripts for Percocet, aspirin and Robaxin sent in to her pharmacy already.  She will follow-up with me when she is 2 weeks postop.  She knows return sooner if needed. ? will have RN apply bilateral thigh-high teds before discharge. ? ? ? ?Zonia Kief ?09/16/2021, 2:38 PM  ? ? ? ? ?

## 2021-09-16 NOTE — TOC Transition Note (Signed)
Transition of Care (TOC) - CM/SW Discharge Note ? ? ?Patient Details  ?Name: Kelly Barajas ?MRN: [id number] ?Date of Birth: 05/11/1951 ? ?Transition of Care [facility]) CM/SW Contact:  ?Epifanio Lesches, RN ?Phone Number: ?09/16/2021, 1:39 PM ? ? ?Clinical Narrative:    ?Patient will DC to: home ?Anticipated DC date: 09/16/2021 ?Family notified: yes ?Transport by: car ? ?Per MD patient ready for DC today. RN, patient, patient's family, and [facility]  notified of DC. Pt states daughter to assist with care once d/c. ?Referral for CPM made with [employer] [employer] / [employer]. Equipment will be delivered to pt's home within 48 hours once approval received. ?Pt without Rx med concerns. Post hospital follow noted on AVS. ?Daughter to provide transportation to home. ? ? ?RNCM will sign off for now as intervention is no longer needed. Please consult [place] again if new needs arise.  ? ?Final next level of care: Home w Home Health Services ?Barriers to Discharge: No Barriers Identified ? ? ?Patient Goals and CMS Choice ?  ?  ?  ? ?Discharge Placement ?  ?           ?  ?  ?  ?  ? ?Discharge Plan and Services ?  ?  ?           ?DME Arranged: CPM ?DME Agency: [employer] ?Date DME Agency Contacted: 09/16/21 ?Time DME Agency Contacted: 1338 ?Representative spoke with at DME Agency: [facility]ro[facility] Donath ?HH Arranged: PT ?HH Agency: [facility] ?  ?  ?  ? ?Social Determinants of Health (SDOH) Interventions ?  ? ? ?Readmission Risk Interventions ?   ? View : No data to display.  ?  ?  ?  ? ? ? ? ? ?

## 2021-09-16 NOTE — Progress Notes (Signed)
Physical Therapy Treatment ?Patient Details ?Name: Kelly Barajas ?MRN: [id number] ?DOB: [birth year] ?Today's Date: 09/16/2021 ? ? ?History of Present Illness 71 y/o female admitted for L TKA with PMH positive for HTN, hypothyroid, GERD and OA. ? ?  ?PT Comments  ? ? Pt was seen for stair climbing and short walk thereafter, and noted her ability to manage with the walker is slow but steady.  Her biggest challenge is standing from lower surface but with minor help can manage it.  Using immobilizer for stability on L knee with effective control esp on stairs, but should be able to get past this in a timely way.  Follow along with her for POC goals, focusing on standing balance and LLE strength.   ?Recommendations for follow up therapy are one component of a multi-disciplinary discharge planning process, led by the attending physician.  Recommendations may be updated based on patient status, additional functional criteria and insurance authorization. ? ?Follow Up Recommendations ? Follow physician's recommendations for discharge plan and follow up therapies ?  ?  ?Assistance Recommended at Discharge Intermittent Supervision/Assistance  ?Patient can return home with the following A little help with walking and/or transfers;A little help with bathing/dressing/bathroom;Assistance with cooking/housework;Assist for transportation;Help with stairs or ramp for entrance ?  ?Equipment Recommendations ? Rolling walker (2 wheels)  ?  ?Recommendations for Other Services   ? ? ?  ?Precautions / Restrictions Precautions ?Precautions: Fall;Knee ?Required Braces or Orthoses: Knee Immobilizer - Left ?Knee Immobilizer - Left: On except when in CPM ?Restrictions ?Weight Bearing Restrictions: Yes ?LLE Weight Bearing: Weight bearing as tolerated  ?  ? ?Mobility ? Bed Mobility ?Overal bed mobility: Needs Assistance ?Bed Mobility: Supine to Sit ?  ?  ?Supine to sit: Min assist ?  ?  ?General bed mobility comments: pt is hurting in mobility to  side of bed and used brace effectively to steady it ?  ? ?Transfers ?Overall transfer level: Needs assistance ?Equipment used: Rolling walker (2 wheels) ?Transfers: Sit to/from Stand ?Sit to Stand: Min guard ?  ?  ?  ?  ?  ?General transfer comment: correction of hand placement to ease transition ?  ? ?Ambulation/Gait ?Ambulation/Gait assistance: Min guard ?Gait Distance (Feet): 30 Feet ?Assistive device: Rolling walker (2 wheels) ?Gait Pattern/deviations: Step-to pattern, Step-through pattern, Decreased stride length, Trunk flexed, Antalgic ?Gait velocity: reduced ?Gait velocity interpretation: <1.31 ft/sec, indicative of household ambulator ?  ?General Gait Details: pt is taking her time and leading with LLE, fatigues but not excessively ? ? ?Stairs ?Stairs: Yes ?Stairs assistance: Min guard ?Stair Management: One rail Left, One rail Right, Step to pattern, Forwards ?Number of Stairs: 4 ?General stair comments: pt is working very hard to step up as LLE is painful as support limb but coming down more comfortable.  Per pt, she has used the sequence prior to her surgery as needed for joint pain ? ? ?Wheelchair Mobility ?  ? ?Modified Rankin (Stroke Patients Only) ?  ? ? ?  ?Balance Overall balance assessment: Needs assistance ?  ?Sitting balance-Leahy Scale: Good ?  ?  ?  ?Standing balance-Leahy Scale: Poor ?Standing balance comment: leans on UE's and elbows trying to pull up to step ?  ?  ?  ?  ?  ?  ?  ?  ?  ?  ?  ?  ? ?  ?Cognition Arousal/Alertness: Awake/alert ?Behavior During Therapy: [facility] for tasks assessed/performed ?Overall Cognitive Status: Within Functional Limits for tasks assessed ?  ?  ?  ?  ?  ?  ?  ?  ?  ?  ?  ?  ?  ?  ?  ?  ?  ?  ?  ? ?  ?  Exercises   ? ?  ?General Comments General comments (skin integrity, edema, etc.): pt is in a struggle with controlling L knee and did use immobilizer for support to manage this during gait and steps today ?  ?  ? ?Pertinent Vitals/Pain Pain Assessment ?Pain  Assessment: Faces ?Faces Pain Scale: Hurts little more ?Pain Location: L knee with mobility ?Pain Descriptors / Indicators: Guarding ?Pain Intervention(s): Limited activity within patient's tolerance, Monitored during session, Premedicated before session, Repositioned, Ice applied  ? ? ?Home Living   ?  ?  ?  ?  ?  ?  ?  ?  ?  ?   ?  ?Prior Function    ?  ?  ?   ? ?PT Goals (current goals can now be found in the care plan section) Acute Rehab PT Goals ?Patient Stated Goal: to go home, return to independent ?Progress towards PT goals: Progressing toward goals ? ?  ?Frequency ? ? ? 7X/week ? ? ? ?  ?PT Plan Current plan remains appropriate  ? ? ?Co-evaluation   ?  ?  ?  ?  ? ?  ?AM-PAC PT "6 Clicks" Mobility   ?Outcome Measure ? Help needed turning from your back to your side while in a flat bed without using bedrails?: None ?Help needed moving from lying on your back to sitting on the side of a flat bed without using bedrails?: A Little ?Help needed moving to and from a bed to a chair (including a wheelchair)?: A Little ?Help needed standing up from a chair using your arms (e.g., wheelchair or bedside chair)?: A Little ?Help needed to walk in hospital room?: A Little ?Help needed climbing 3-5 steps with a railing? : A Little ?6 Click Score: 19 ? ?  ?End of Session Equipment Utilized During Treatment: Gait belt;Left knee immobilizer ?Activity Tolerance: Patient tolerated treatment well;Patient limited by pain;Treatment limited secondary to medical complications (Comment) ?Patient left: in chair;with call bell/phone within reach;with chair alarm set ?Nurse Communication: Mobility status ?PT Visit Diagnosis: Difficulty in walking, not elsewhere classified (R26.2);Pain ?Pain - Right/Left: Left ?Pain - part of body: Knee ?  ? ? ?Time: [id number] ?PT Time Calculation (min) (ACUTE ONLY): 27 min ? ?Charges:  $Gait Training: 8-22 mins ?$Therapeutic Activity: 8-22 mins        ?Ramond Dial ?09/16/2021, 10:56 AM ? ?Mee Hives, PT  PhD ?Acute Rehab Dept. Number: [facility] [id number] and [place] [phone] ? ? ?

## 2021-09-16 NOTE — Plan of Care (Signed)
Adequate for discharge.

## 2021-09-17 DIAGNOSIS — E039 Hypothyroidism, unspecified: Secondary | ICD-10-CM | POA: Diagnosis not present

## 2021-09-17 DIAGNOSIS — Z79891 Long term (current) use of opiate analgesic: Secondary | ICD-10-CM | POA: Diagnosis not present

## 2021-09-17 DIAGNOSIS — Z471 Aftercare following joint replacement surgery: Secondary | ICD-10-CM | POA: Diagnosis not present

## 2021-09-17 DIAGNOSIS — Z7982 Long term (current) use of aspirin: Secondary | ICD-10-CM | POA: Diagnosis not present

## 2021-09-17 DIAGNOSIS — N3281 Overactive bladder: Secondary | ICD-10-CM | POA: Diagnosis not present

## 2021-09-17 DIAGNOSIS — Z6834 Body mass index (BMI) 34.0-34.9, adult: Secondary | ICD-10-CM | POA: Diagnosis not present

## 2021-09-17 DIAGNOSIS — E559 Vitamin D deficiency, unspecified: Secondary | ICD-10-CM | POA: Diagnosis not present

## 2021-09-17 DIAGNOSIS — E669 Obesity, unspecified: Secondary | ICD-10-CM | POA: Diagnosis not present

## 2021-09-17 DIAGNOSIS — M1711 Unilateral primary osteoarthritis, right knee: Secondary | ICD-10-CM | POA: Diagnosis not present

## 2021-09-17 DIAGNOSIS — M8580 Other specified disorders of bone density and structure, unspecified site: Secondary | ICD-10-CM | POA: Diagnosis not present

## 2021-09-17 DIAGNOSIS — K219 Gastro-esophageal reflux disease without esophagitis: Secondary | ICD-10-CM | POA: Diagnosis not present

## 2021-09-17 DIAGNOSIS — Z96652 Presence of left artificial knee joint: Secondary | ICD-10-CM | POA: Diagnosis not present

## 2021-09-17 DIAGNOSIS — E782 Mixed hyperlipidemia: Secondary | ICD-10-CM | POA: Diagnosis not present

## 2021-09-17 DIAGNOSIS — I10 Essential (primary) hypertension: Secondary | ICD-10-CM | POA: Diagnosis not present

## 2021-09-19 ENCOUNTER — Telehealth: Payer: Self-pay | Admitting: Orthopaedic Surgery

## 2021-09-19 ENCOUNTER — Telehealth: Payer: Self-pay | Admitting: *Deleted

## 2021-09-19 NOTE — Telephone Encounter (Signed)
Transition Care Management Unsuccessful Follow-up Telephone Call ? ?Date of discharge and from where:  [place] 09-16-2021 ? ?Attempts:  1st Attempt ? ?Reason for unsuccessful TCM follow-up call:  Left voice message ? ?  ?

## 2021-09-19 NOTE — Telephone Encounter (Signed)
I called and spoke with patient and her daughter. They have noticed more swelling in the thigh and increased bruising today. Patient is wearing TEDS and the swelling in her lower leg is down. She denies calf pain, tenderness, or hardness. She is mostly having muscle spasms and problems with constipation. She took Oxycodone at 2am and has not needed one since then. Her daughter has given her an enema for the constipation, but was concerned with the bruising and swelling in the thigh. Patient is taking aspirin daily.   ? ?Is there anything that you would advise they need to do? Patient feels that she is doing extremely well. ?

## 2021-09-19 NOTE — Telephone Encounter (Signed)
Please call the pt regarding additional bruising  ?

## 2021-09-20 ENCOUNTER — Encounter: Payer: Self-pay | Admitting: Orthopaedic Surgery

## 2021-09-20 ENCOUNTER — Telehealth: Payer: Self-pay | Admitting: Orthopaedic Surgery

## 2021-09-20 ENCOUNTER — Other Ambulatory Visit: Payer: Self-pay | Admitting: *Deleted

## 2021-09-20 ENCOUNTER — Telehealth: Payer: Self-pay | Admitting: *Deleted

## 2021-09-20 DIAGNOSIS — Z96652 Presence of left artificial knee joint: Secondary | ICD-10-CM | POA: Diagnosis not present

## 2021-09-20 DIAGNOSIS — E559 Vitamin D deficiency, unspecified: Secondary | ICD-10-CM | POA: Diagnosis not present

## 2021-09-20 DIAGNOSIS — M1712 Unilateral primary osteoarthritis, left knee: Secondary | ICD-10-CM

## 2021-09-20 DIAGNOSIS — E669 Obesity, unspecified: Secondary | ICD-10-CM | POA: Diagnosis not present

## 2021-09-20 DIAGNOSIS — E782 Mixed hyperlipidemia: Secondary | ICD-10-CM | POA: Diagnosis not present

## 2021-09-20 DIAGNOSIS — I10 Essential (primary) hypertension: Secondary | ICD-10-CM | POA: Diagnosis not present

## 2021-09-20 DIAGNOSIS — M1711 Unilateral primary osteoarthritis, right knee: Secondary | ICD-10-CM | POA: Diagnosis not present

## 2021-09-20 DIAGNOSIS — N3281 Overactive bladder: Secondary | ICD-10-CM | POA: Diagnosis not present

## 2021-09-20 DIAGNOSIS — Z471 Aftercare following joint replacement surgery: Secondary | ICD-10-CM | POA: Diagnosis not present

## 2021-09-20 DIAGNOSIS — Z6834 Body mass index (BMI) 34.0-34.9, adult: Secondary | ICD-10-CM | POA: Diagnosis not present

## 2021-09-20 DIAGNOSIS — M8580 Other specified disorders of bone density and structure, unspecified site: Secondary | ICD-10-CM | POA: Diagnosis not present

## 2021-09-20 DIAGNOSIS — Z79891 Long term (current) use of opiate analgesic: Secondary | ICD-10-CM | POA: Diagnosis not present

## 2021-09-20 DIAGNOSIS — Z7982 Long term (current) use of aspirin: Secondary | ICD-10-CM | POA: Diagnosis not present

## 2021-09-20 DIAGNOSIS — E039 Hypothyroidism, unspecified: Secondary | ICD-10-CM | POA: Diagnosis not present

## 2021-09-20 DIAGNOSIS — K219 Gastro-esophageal reflux disease without esophagitis: Secondary | ICD-10-CM | POA: Diagnosis not present

## 2021-09-20 NOTE — Telephone Encounter (Signed)
Dr. Lorin Mercy spoke with patient. She is having big toe pain opposite extremity with redness and she and daughter feel like it is gout. Per Dr. Lorin Mercy, drink plenty of fluids, may take aleve 2 bid daily prn.  Patient does not have history of gout. They will call back if not better within a couple of days. ?

## 2021-09-20 NOTE — Telephone Encounter (Signed)
Duplicate message in chart.  

## 2021-09-20 NOTE — Telephone Encounter (Signed)
Pt called requesting a call back for medical advice of new issue of gout in right big toe. Pt's phone number is [phone] ?

## 2021-09-20 NOTE — Telephone Encounter (Signed)
Ortho bundle D/C call completed. 

## 2021-09-22 ENCOUNTER — Encounter: Payer: Self-pay | Admitting: Orthopaedic Surgery

## 2021-09-22 ENCOUNTER — Encounter: Payer: PPO | Admitting: Orthopaedic Surgery

## 2021-09-24 DIAGNOSIS — Z79891 Long term (current) use of opiate analgesic: Secondary | ICD-10-CM | POA: Diagnosis not present

## 2021-09-24 DIAGNOSIS — I10 Essential (primary) hypertension: Secondary | ICD-10-CM | POA: Diagnosis not present

## 2021-09-24 DIAGNOSIS — E669 Obesity, unspecified: Secondary | ICD-10-CM | POA: Diagnosis not present

## 2021-09-24 DIAGNOSIS — Z7982 Long term (current) use of aspirin: Secondary | ICD-10-CM | POA: Diagnosis not present

## 2021-09-24 DIAGNOSIS — M8580 Other specified disorders of bone density and structure, unspecified site: Secondary | ICD-10-CM | POA: Diagnosis not present

## 2021-09-24 DIAGNOSIS — E559 Vitamin D deficiency, unspecified: Secondary | ICD-10-CM | POA: Diagnosis not present

## 2021-09-24 DIAGNOSIS — Z96652 Presence of left artificial knee joint: Secondary | ICD-10-CM | POA: Diagnosis not present

## 2021-09-24 DIAGNOSIS — M1711 Unilateral primary osteoarthritis, right knee: Secondary | ICD-10-CM | POA: Diagnosis not present

## 2021-09-24 DIAGNOSIS — Z6834 Body mass index (BMI) 34.0-34.9, adult: Secondary | ICD-10-CM | POA: Diagnosis not present

## 2021-09-24 DIAGNOSIS — Z471 Aftercare following joint replacement surgery: Secondary | ICD-10-CM | POA: Diagnosis not present

## 2021-09-24 DIAGNOSIS — N3281 Overactive bladder: Secondary | ICD-10-CM | POA: Diagnosis not present

## 2021-09-24 DIAGNOSIS — E039 Hypothyroidism, unspecified: Secondary | ICD-10-CM | POA: Diagnosis not present

## 2021-09-24 DIAGNOSIS — K219 Gastro-esophageal reflux disease without esophagitis: Secondary | ICD-10-CM | POA: Diagnosis not present

## 2021-09-24 DIAGNOSIS — E782 Mixed hyperlipidemia: Secondary | ICD-10-CM | POA: Diagnosis not present

## 2021-09-26 DIAGNOSIS — M1711 Unilateral primary osteoarthritis, right knee: Secondary | ICD-10-CM | POA: Diagnosis not present

## 2021-09-26 DIAGNOSIS — Z79891 Long term (current) use of opiate analgesic: Secondary | ICD-10-CM | POA: Diagnosis not present

## 2021-09-26 DIAGNOSIS — E669 Obesity, unspecified: Secondary | ICD-10-CM | POA: Diagnosis not present

## 2021-09-26 DIAGNOSIS — E782 Mixed hyperlipidemia: Secondary | ICD-10-CM | POA: Diagnosis not present

## 2021-09-26 DIAGNOSIS — Z96652 Presence of left artificial knee joint: Secondary | ICD-10-CM | POA: Diagnosis not present

## 2021-09-26 DIAGNOSIS — Z471 Aftercare following joint replacement surgery: Secondary | ICD-10-CM | POA: Diagnosis not present

## 2021-09-26 DIAGNOSIS — I10 Essential (primary) hypertension: Secondary | ICD-10-CM | POA: Diagnosis not present

## 2021-09-26 DIAGNOSIS — M8580 Other specified disorders of bone density and structure, unspecified site: Secondary | ICD-10-CM | POA: Diagnosis not present

## 2021-09-26 DIAGNOSIS — N3281 Overactive bladder: Secondary | ICD-10-CM | POA: Diagnosis not present

## 2021-09-26 DIAGNOSIS — E559 Vitamin D deficiency, unspecified: Secondary | ICD-10-CM | POA: Diagnosis not present

## 2021-09-26 DIAGNOSIS — Z6834 Body mass index (BMI) 34.0-34.9, adult: Secondary | ICD-10-CM | POA: Diagnosis not present

## 2021-09-26 DIAGNOSIS — Z7982 Long term (current) use of aspirin: Secondary | ICD-10-CM | POA: Diagnosis not present

## 2021-09-26 DIAGNOSIS — E039 Hypothyroidism, unspecified: Secondary | ICD-10-CM | POA: Diagnosis not present

## 2021-09-26 DIAGNOSIS — K219 Gastro-esophageal reflux disease without esophagitis: Secondary | ICD-10-CM | POA: Diagnosis not present

## 2021-09-27 NOTE — Discharge Summary (Signed)
? ?Patient ID: ?Kelly Barajas ?MRN: [id number] ?DOB/AGE: 1950-07-19 71 y.o. ? ?Admit date: 09/14/2021 ?Discharge date: 09/16/2021 ? ?Admission Diagnoses:  ?Principal Problem: ?  Arthritis of left knee ?Active Problems: ?  Unilateral primary osteoarthritis, left knee ? ? ?Discharge Diagnoses:  ?Principal Problem: ?  Arthritis of left knee ?Active Problems: ?  Unilateral primary osteoarthritis, left knee ? status post Procedure(s): ?LEFT TOTAL KNEE ARTHROPLASTY ?KNEE INJECTION ? ?Past Medical History:  ?Diagnosis Date  ? Arthritis   ? Essential hypertension 11/27/2013  ? GERD (gastroesophageal reflux disease)   ? Hepatitis   ? Diagnosed 50 years ago, unsure of which type, but she did have jaundice.  ? Hyperlipidemia   ? Hypothyroidism   ? Osteopenia 08/21/2019  ? ? ?Surgeries: Procedure(s): ?LEFT TOTAL KNEE ARTHROPLASTY ?KNEE INJECTION on 09/14/2021 ?  ?Consultants:  ? ?Discharged Condition: Improved ? ?Hospital Course: Kelly Barajas is an 71 y.o. female who was admitted 09/14/2021 for operative treatment of Arthritis of left knee. Patient failed conservative treatments (please see the history and physical for the specifics) and had severe unremitting pain that affects sleep, daily activities and work/hobbies. After pre-op clearance, the patient was taken to the operating room on 09/14/2021 and underwent  Procedure(s): ?LEFT TOTAL KNEE ARTHROPLASTY ?KNEE INJECTION.   ? ?Patient was given perioperative antibiotics:  ?Anti-infectives (From admission, onward)  ? ? Start     Dose/Rate Route Frequency Ordered Stop  ? 09/14/21 1045  ceFAZolin (ANCEF) IVPB 2g/100 mL premix       ? 2 g ?200 mL/hr over 30 Minutes Intravenous On call to O.R. 09/14/21 1036 09/14/21 1328  ? 09/14/21 1041  ceFAZolin (ANCEF) 2-4 GM/100ML-% IVPB       ?Note to Pharmacy: Rocky Morel D: cabinet override  ?    09/14/21 1041 09/14/21 1335  ? ?  ?  ? ?Patient was given sequential compression devices and early ambulation to prevent DVT.  ? ?Patient  benefited maximally from hospital stay and there were no complications. At the time of discharge, the patient was urinating/moving their bowels without difficulty, tolerating a regular diet, pain is controlled with oral pain medications and they have been cleared by PT/OT.  ? ?Recent vital signs: No data found.  ? ?Recent laboratory studies: No results for input(s): WBC, HGB, HCT, PLT, NA, K, CL, CO2, BUN, CREATININE, GLUCOSE, INR, CALCIUM in the last 72 hours. ? ?Invalid input(s): PT, 2 ? ? ?Discharge Medications:   ?Allergies as of 09/16/2021   ? ?   Reactions  ? Nickel Itching  ? ?  ? ?  ?Medication List  ?  ? ?TAKE these medications   ? ?aspirin 325 MG EC tablet ?Take 1 tablet (325 mg total) by mouth daily with breakfast. MUST TAKE AT LEAST 4 WEEKS POSTOP FOR DVT PROPHYLAXIS ?What changed:  ?medication strength ?how much to take ?when to take this ?additional instructions ?  ?CALCIUM + D PO ?Take 1 tablet by mouth 2 (two) times daily. ?  ?CoQ10 100 MG Caps ?Take 100 mg by mouth daily. ?  ?FISH OIL PO ?Take 2,000 mg by mouth 2 (two) times daily. ?  ?lisinopril-hydrochlorothiazide 20-12.5 MG tablet ?Commonly known as: ZESTORETIC ?Take 1 tablet by mouth daily. ?  ?Livalo 4 MG Tabs ?Generic drug: Pitavastatin Calcium ?TAKE 1 TABLET BY MOUTH AT BEDTIME ?  ?magnesium gluconate 500 MG tablet ?Commonly known as: MAGONATE ?Take 500 mg by mouth in the morning and at bedtime. ?  ?methocarbamol 500 MG tablet ?Commonly known  as: ROBAXIN ?Take 1 tablet (500 mg total) by mouth every 6 (six) hours as needed for muscle spasms. ?  ?omeprazole 20 MG capsule ?Commonly known as: PRILOSEC ?Take 20 mg by mouth daily. ?  ?OVER THE COUNTER MEDICATION ?Inhale 1 puff into the lungs daily as needed (congestion). himalayan pink salt inhaler ?  ?oxyCODONE-acetaminophen 5-325 MG tablet ?Commonly known as: PERCOCET/ROXICET ?Take 1 tablet by mouth every 4 (four) hours as needed for severe pain. ?  ?Synthroid 88 MCG tablet ?Generic drug:  levothyroxine ?TAKE 1 TABLET BY MOUTH ONCE DAILY BEFORE BREAKFAST ?  ? ?  ? ? ?Diagnostic Studies: DG Chest 2 View ? ?Result Date: 09/13/2021 ?CLINICAL DATA:  71 year old female with a history of preoperative chest x-ray EXAM: CHEST - 2 VIEW COMPARISON:  09/22/2011 FINDINGS: Cardiomediastinal silhouette unchanged in size and contour. No evidence of central vascular congestion. No interlobular septal thickening. Double density projecting over the lower mediastinum, unchanged No pneumothorax or pleural effusion. Coarsened interstitial markings, with no confluent airspace disease. No acute displaced fracture. Degenerative changes of the spine. IMPRESSION: Negative for acute cardiopulmonary disease. Hiatal hernia Electronically Signed   By: Corrie Mckusick D.O.   On: 09/13/2021 12:45  ? ?DG Knee Left Port ? ?Result Date: 09/14/2021 ?CLINICAL DATA:  Postop left total knee replacement EXAM: PORTABLE LEFT KNEE - 1-2 VIEW COMPARISON:  04/18/2021 FINDINGS: Left total knee arthroplasty changes noted with anatomic alignment. No complicating feature by plain radiography. Bones appear osteopenic. Hardware aligned. Expected postop changes of the soft tissues and joint space. Midline staples present. IMPRESSION: Status post left total knee arthroplasty. Expected postoperative appearance. Electronically Signed   By: Jerilynn Mages.  Shick M.D.   On: 09/14/2021 17:11   ? ? ? ? Follow-up Information   ? ? Marybelle Killings, MD. Go on 09/22/2021.   ?Specialty: Orthopedic Surgery ?Why: at 2:30 pm for your first in office appointment with Dr. Lorin Mercy after surgery.  ?This appointment is scheduled for the [facility] location. ?Contact information: ?[address] ?[place] [address] ?[phone] ? ? ?  ?  ? ? [facility] [employer] Follow up.   ?Specialty: Home Health Services ?Why: Someone from the home health agency will be in contact with you to coordinate your first in home physical therapy appointment after discharge. ?Contact information: ?[address] ?[address] ?[place] [address] ?[phone] ? ? ?  ?  ? ? Leamon Arnt, MD Follow up.   ?Specialty: Family Medicine ?Contact information: ?[address] [place] [address] ?[place] [address] ?[phone] ? ? ?  ?  ? ?  ?  ? ?  ? ? ?Discharge Plan:  discharge to home ? ?Disposition:  ? ? ? ?Signed: ?Benjiman Core  ?09/27/2021, 3:11 PM ? ? ? ?  ?

## 2021-09-28 DIAGNOSIS — E559 Vitamin D deficiency, unspecified: Secondary | ICD-10-CM | POA: Diagnosis not present

## 2021-09-28 DIAGNOSIS — E669 Obesity, unspecified: Secondary | ICD-10-CM | POA: Diagnosis not present

## 2021-09-28 DIAGNOSIS — E782 Mixed hyperlipidemia: Secondary | ICD-10-CM | POA: Diagnosis not present

## 2021-09-28 DIAGNOSIS — E039 Hypothyroidism, unspecified: Secondary | ICD-10-CM | POA: Diagnosis not present

## 2021-09-28 DIAGNOSIS — Z6834 Body mass index (BMI) 34.0-34.9, adult: Secondary | ICD-10-CM | POA: Diagnosis not present

## 2021-09-28 DIAGNOSIS — Z79891 Long term (current) use of opiate analgesic: Secondary | ICD-10-CM | POA: Diagnosis not present

## 2021-09-28 DIAGNOSIS — Z96652 Presence of left artificial knee joint: Secondary | ICD-10-CM | POA: Diagnosis not present

## 2021-09-28 DIAGNOSIS — Z471 Aftercare following joint replacement surgery: Secondary | ICD-10-CM | POA: Diagnosis not present

## 2021-09-28 DIAGNOSIS — M8580 Other specified disorders of bone density and structure, unspecified site: Secondary | ICD-10-CM | POA: Diagnosis not present

## 2021-09-28 DIAGNOSIS — Z7982 Long term (current) use of aspirin: Secondary | ICD-10-CM | POA: Diagnosis not present

## 2021-09-28 DIAGNOSIS — I10 Essential (primary) hypertension: Secondary | ICD-10-CM | POA: Diagnosis not present

## 2021-09-28 DIAGNOSIS — M1711 Unilateral primary osteoarthritis, right knee: Secondary | ICD-10-CM | POA: Diagnosis not present

## 2021-09-28 DIAGNOSIS — K219 Gastro-esophageal reflux disease without esophagitis: Secondary | ICD-10-CM | POA: Diagnosis not present

## 2021-09-28 DIAGNOSIS — N3281 Overactive bladder: Secondary | ICD-10-CM | POA: Diagnosis not present

## 2021-09-29 ENCOUNTER — Ambulatory Visit: Payer: Self-pay

## 2021-09-29 ENCOUNTER — Ambulatory Visit (INDEPENDENT_AMBULATORY_CARE_PROVIDER_SITE_OTHER): Payer: PPO | Admitting: Surgery

## 2021-09-29 ENCOUNTER — Encounter: Payer: Self-pay | Admitting: Surgery

## 2021-09-29 VITALS — BP 131/76 | Ht 62.0 in | Wt 193.0 lb

## 2021-09-29 DIAGNOSIS — Z96652 Presence of left artificial knee joint: Secondary | ICD-10-CM

## 2021-09-29 NOTE — Progress Notes (Signed)
? ?Post-Op Visit Note ?  ?Patient: Kelly Barajas           ?Date of Birth: 02/28/1951           ?MRN: [id number] ?Visit Date: 09/29/2021 ?PCP: Willow Ora, MD ? ? ?Assessment & Plan: ? ?Chief Complaint:  ?Chief Complaint  ?Patient presents with  ? Left Knee - Routine Post Op  ?  09/14/2021 Left TKA Ophelia Charter)  ?Patient returns.  2-week status post left total knee replacement.  Patient is doing very well and she is pleased with her progress up to this point. ?Visit Diagnoses:  ?1. S/P total knee arthroplasty, left   ? ? ?Plan: Patient doing very well at this point.  She will continue PT protocol.  Follow-up with Dr. Ophelia Charter in 4 weeks for recheck. ? ?Follow-Up Instructions: Return in about 4 weeks (around 10/27/2021).  ? ?Orders:  ?Orders Placed This Encounter  ?Procedures  ? XR Knee 1-2 Views Left  ? ?No orders of the defined types were placed in this encounter. ? ? ?Imaging: ?No results found. ? ?PMFS History: ?Patient Active Problem List  ? Diagnosis Date Noted  ? Unilateral primary osteoarthritis, left knee 09/14/2021  ? Arthritis of left knee 09/14/2021  ? Elevated LFTs 09/01/2020  ? Osteopenia 08/21/2019  ? Bilateral primary osteoarthritis of knee 02/19/2019  ? Obesity (BMI 30-39.9) 09/05/2016  ? Overactive bladder 03/07/2016  ? Vitamin D deficiency 03/07/2016  ? Hypothyroidism 06/01/2014  ? Gastroesophageal reflux disease without esophagitis 11/27/2013  ? Essential hypertension 11/27/2013  ? Mixed hyperlipidemia 10/04/2012  ? ?Past Medical History:  ?Diagnosis Date  ? Arthritis   ? Essential hypertension 11/27/2013  ? GERD (gastroesophageal reflux disease)   ? Hepatitis   ? Diagnosed 50 years ago, unsure of which type, but she did have jaundice.  ? Hyperlipidemia   ? Hypothyroidism   ? Osteopenia 08/21/2019  ?  ?Family History  ?Problem Relation Age of Onset  ? Hyperlipidemia Mother   ? Diabetes Mellitus II Mother   ? CAD Mother   ? Hypertension Mother   ? Depression Mother   ? Diabetes Mother   ? Arthritis  Mother   ? CAD Father   ? Hyperlipidemia Father   ? Heart attack Father   ? Hypertension Sister   ? Hypertension Brother   ? Hypothyroidism Daughter   ? Colon cancer Maternal Grandmother 75  ? Hypothyroidism Daughter   ?  ?Past Surgical History:  ?Procedure Laterality Date  ? DILATION AND CURETTAGE OF UTERUS    ? DILATION AND CURETTAGE, DIAGNOSTIC / THERAPEUTIC    ? INJECTION KNEE Right 09/14/2021  ? Procedure: KNEE INJECTION;  Surgeon: Eldred Manges, MD;  Location: [facility] OR;  Service: Orthopedics;  Laterality: Right;  ? KNEE ARTHROSCOPY W/ MENISCAL REPAIR Right 07/16/2017  ? TONSILLECTOMY    ? TOTAL KNEE ARTHROPLASTY Left 09/14/2021  ? Procedure: LEFT TOTAL KNEE ARTHROPLASTY;  Surgeon: Eldred Manges, MD;  Location: [facility] OR;  Service: Orthopedics;  Laterality: Left;  ? ?Social History  ? ?Occupational History  ? Occupation: retired Engineer, agricultural  ?Tobacco Use  ? Smoking status: Never  ? Smokeless tobacco: Never  ?Vaping Use  ? Vaping Use: Never used  ?Substance and Sexual Activity  ? Alcohol use: Yes  ?  Comment: socially  ? Drug use: No  ? Sexual activity: Never  ?  Birth control/protection: Post-menopausal  ? ?Exam ?Pleasant female.  Alert and oriented in no acute distress.  Wound looks  good.  Staples removed and Steri-Strips applied.  No drainage or signs of infection.  Range of motion about 0 to 90 degrees passive.  She does have about a 3 to 5 degree extensor lag from quad weakness. ?

## 2021-10-03 ENCOUNTER — Ambulatory Visit: Payer: PPO | Attending: Orthopaedic Surgery

## 2021-10-03 DIAGNOSIS — M25562 Pain in left knee: Secondary | ICD-10-CM | POA: Diagnosis not present

## 2021-10-03 DIAGNOSIS — M25662 Stiffness of left knee, not elsewhere classified: Secondary | ICD-10-CM | POA: Insufficient documentation

## 2021-10-03 DIAGNOSIS — M1712 Unilateral primary osteoarthritis, left knee: Secondary | ICD-10-CM | POA: Diagnosis not present

## 2021-10-03 NOTE — Therapy (Signed)
[place] [facility] ?[place] ?[place], [place], [address] ?Phone: [phone]   Fax:  [phone] ? ?Physical Therapy Evaluation ? ?Patient Details  ?Name: Kelly Barajas ?MRN: [id number] ?Date of Birth: 06-17-1950 ?Referring Provider (PT): Lorin Mercy, MD ? ? ?Encounter Date: 10/03/2021 ? ? PT End of Session - 10/03/21 1346   ? ? Visit Number 1   ? Number of Visits 10   ? Date for PT Re-Evaluation 12/09/21   ? PT Start Time 1347   ? PT Stop Time 1440   ? PT Time Calculation (min) 53 min   ? Activity Tolerance Patient tolerated treatment well   ? Behavior During Therapy [facility] for tasks assessed/performed   ? ?  ?  ? ?  ? ? ?Past Medical History:  ?Diagnosis Date  ? Arthritis   ? Essential hypertension 11/27/2013  ? GERD (gastroesophageal reflux disease)   ? Hepatitis   ? Diagnosed 50 years ago, unsure of which type, but she did have jaundice.  ? Hyperlipidemia   ? Hypothyroidism   ? Osteopenia 08/21/2019  ? ? ?Past Surgical History:  ?Procedure Laterality Date  ? DILATION AND CURETTAGE OF UTERUS    ? DILATION AND CURETTAGE, DIAGNOSTIC / THERAPEUTIC    ? INJECTION KNEE Right 09/14/2021  ? Procedure: KNEE INJECTION;  Surgeon: Marybelle Killings, MD;  Location: [facility];  Service: Orthopedics;  Laterality: Right;  ? KNEE ARTHROSCOPY W/ MENISCAL REPAIR Right 07/16/2017  ? TONSILLECTOMY    ? TOTAL KNEE ARTHROPLASTY Left 09/14/2021  ? Procedure: LEFT TOTAL KNEE ARTHROPLASTY;  Surgeon: Marybelle Killings, MD;  Location: [facility];  Service: Orthopedics;  Laterality: Left;  ? ? ?There were no vitals filed for this visit. ? ? ? Subjective Assessment - 10/03/21 1346   ? ? Subjective Patient reports that she had a left TKA on 09/14/21. She had home health for 2 weeks and she feels that this really helped her do her daily activities. She notes that she is having a little pain in her 1st toe on her left foot, but this is the first time this has happened. She is still having trouble sleeping, but she feels that this is  getting easier.   ? Pertinent History gout,   ? Limitations Walking;Standing;House hold activities   ? How long can you walk comfortably? 20-30 minutes   ? Patient Stated Goals travel, walk on uneven terrain, navigate steps comfortably (get into her basement), go back to the gym   ? Currently in Pain? Yes   ? Pain Score 8    ? Pain Location Knee   ? Pain Orientation Left   ? Pain Descriptors / Indicators Tightness   ? Pain Type Surgical pain   ? Pain Radiating Towards into left hip   ? Pain Onset 1 to 4 weeks ago   ? Pain Frequency Constant   ? Aggravating Factors  steps   ? Pain Relieving Factors walking, ice, elevation, massage   ? Effect of Pain on Daily Activities she is having to slow down and avoids doing extra activities   ? ?  ?  ? ?  ? ? ? ? ? [facility] PT Assessment - 10/03/21 0001   ? ?  ? Assessment  ? Medical Diagnosis Left TKA   ? Referring Provider (PT) Lorin Mercy, MD   ? Onset Date/Surgical Date 09/14/21   ? Next MD Visit 10/28/21   ? Prior Therapy Yes, home health   ?  ? Precautions  ?  Precautions None   ?  ? Restrictions  ? Weight Bearing Restrictions No   ?  ? Balance Screen  ? Has the patient fallen in the past 6 months No   ? Has the patient had a decrease in activity level because of a fear of falling?  No   ? Is the patient reluctant to leave their home because of a fear of falling?  No   ?  ? Home Environment  ? Living Environment Private residence   ? Type of Home House   ? Home Access Stairs to enter   ? Entrance Stairs-Number of Steps 3-7   ? Entrance Stairs-Rails Left   ? Home Layout Laundry or work area in basement   about 10-12 steps  ? [place] - 2 wheels   ?  ? Prior Function  ? Level of Independence Independent   ? Leisure shopping,   ?  ? Cognition  ? Overall Cognitive Status Within Functional Limits for tasks assessed   ? Attention Focused   ? Focused Attention Appears intact   ? Memory Appears intact   ? Awareness Appears intact   ? Problem Solving Appears intact   ?  ?  Observation/Other Assessments  ? Focus on Therapeutic Outcomes (FOTO)  46.96   ?  ? Sensation  ? Additional Comments Patient reports no numbness or tingling   ?  ? ROM / Strength  ? AROM / PROM / Strength AROM;PROM   ?  ? AROM  ? Overall AROM Comments RIght knee: WFL   ? AROM Assessment Site Knee   ? Right/Left Knee Left   ? Left Knee Extension 5   ? Left Knee Flexion 88   ?  ? PROM  ? PROM Assessment Site Knee   ? Right/Left Knee Left   ? Left Knee Flexion 99   ?  ? Palpation  ? Patella mobility Left: WFL and nonpainful   ? Palpation comment TTP: left gastroc/soleus, hamstrings   ?  ? Transfers  ? Transfers Sit to Stand;Stand to Sit   ? Sit to Stand 6: Modified independent (Device/Increase time);With upper extremity assist;With armrests   ? Stand to Sit 6: Modified independent (Device/Increase time);With upper extremity assist;With armrests   ?  ? Ambulation/Gait  ? Ambulation/Gait Yes   ? Ambulation/Gait Assistance 6: Modified independent (Device/Increase time)   ? Assistive device Rolling walker   ? Gait Pattern Step-through pattern;Decreased stride length   ? Ambulation Surface Level;Indoor   ? Gait velocity decreased   ? ?  ?  ? ?  ? ? ? ? ? ? ? ? ? ? ? ? ? ?Objective measurements completed on examination: See above findings.  ? ? ? ? ? [facility] Adult PT Treatment/Exercise - 10/03/21 0001   ? ?  ? Exercises  ? Exercises Knee/Hip   ?  ? Knee/Hip Exercises: Aerobic  ? Nustep L1 x 9 minutes   ?  ? Modalities  ? Modalities Vasopneumatic   ?  ? Vasopneumatic  ? Number Minutes Vasopneumatic  10 minutes   ? Vasopnuematic Location  Knee   ? Vasopneumatic Pressure Low   ? Vasopneumatic Temperature  34   ? ?  ?  ? ?  ? ? ? ? ? ? ? ? ? ? ? ? ? ? ? PT Long Term Goals - 10/03/21 1437   ? ?  ? PT LONG TERM GOAL #1  ? Title Patient will be independent with her  HEP.   ? Time 5   ? Period Weeks   ? Status New   ? Target Date 11/07/21   ?  ? PT LONG TERM GOAL #2  ? Title Patient will be able to safely navigate at least 4 steps with  a reciprical pattern for improved function with household navigation.   ? Time 5   ? Period Weeks   ? Status New   ? Target Date 11/07/21   ?  ? PT LONG TERM GOAL #3  ? Title Patient will be able to demonstrate at least 120 degrees of active left knee flexion for improved function navigating stairs.   ? Time 5   ? Period Weeks   ? Status New   ? Target Date 11/07/21   ?  ? PT LONG TERM GOAL #4  ? Title Patient will be able to walk at least 80 feet without an assistive device for improved function with household navigation.   ? Time 5   ? Period Weeks   ? Status New   ? Target Date 11/07/21   ? ?  ?  ? ?  ? ? ? ? ? ? ? ? ? Plan - 10/03/21 1404   ? ? Clinical Impression Statement Patient is a 71 year old female presenting to physical therapy following a left TKA on 09/14/21. She presented to treatment with moderate pain severity and irritability. Her primary limitation was her left knee flexion. She also exhibited gait deviations and utilized a rolling walker for walking. Recommend that she continue with skilled physical therapy to address her remaining impairments to return to her prior level of function.   ? Personal Factors and Comorbidities Transportation;Other;Comorbidity 2   ? Comorbidities HTN, OA   ? Examination-Activity Limitations Locomotion Level;Transfers;Sleep;Carry;Squat;Stairs;Stand   ? Examination-Participation Restrictions Cleaning;Community Activity;Driving;Shop;Other   ? Stability/Clinical Decision Making Stable/Uncomplicated   ? Clinical Decision Making Low   ? Rehab Potential Good   ? PT Frequency 2x / week   2-3x / week  ? PT Duration Other (comment)   5 weeks  ? PT Treatment/Interventions ADLs/Self Care Home Management;Cryotherapy;Electrical Stimulation;Moist Heat;Neuromuscular re-education;Therapeutic exercise;Therapeutic activities;Functional mobility training;Stair training;Gait training;Patient/family education;Manual techniques;Passive range of motion;Taping;Vasopneumatic Device   ? PT Next  Visit Plan nustep, LAQ, lunges, thomas stretch, AA and AROM interventions for improved knee mobility and strength with modalities as needed   ? PT Home Exercise Plan LAQ, seated knee flexion, heel/toe raises,

## 2021-10-05 ENCOUNTER — Ambulatory Visit: Payer: PPO

## 2021-10-05 DIAGNOSIS — M25562 Pain in left knee: Secondary | ICD-10-CM

## 2021-10-05 DIAGNOSIS — M25662 Stiffness of left knee, not elsewhere classified: Secondary | ICD-10-CM | POA: Diagnosis not present

## 2021-10-05 NOTE — Therapy (Signed)
[facility] ?[facility] ?[place] ?[place], [place], [address] ?Phone: [phone]   Fax:  [phone] ? ?Physical Therapy Treatment ? ?Patient Details  ?Name: Kelly Barajas ?MRN: [id number] ?Date of Birth: 11-Apr-1951 ?Referring Provider (PT): Lorin Mercy, MD ? ? ?Encounter Date: 10/05/2021 ? ? PT End of Session - 10/05/21 1352   ? ? Visit Number 2   ? Number of Visits 10   ? Date for PT Re-Evaluation 12/09/21   ? PT Start Time 1347   ? PT Stop Time [id number]   ? PT Time Calculation (min) 44 min   ? Activity Tolerance Patient tolerated treatment well   ? Behavior During Therapy [facility] for tasks assessed/performed   ? ?  ?  ? ?  ? ? ?Past Medical History:  ?Diagnosis Date  ? Arthritis   ? Essential hypertension 11/27/2013  ? GERD (gastroesophageal reflux disease)   ? Hepatitis   ? Diagnosed 50 years ago, unsure of which type, but she did have jaundice.  ? Hyperlipidemia   ? Hypothyroidism   ? Osteopenia 08/21/2019  ? ? ?Past Surgical History:  ?Procedure Laterality Date  ? DILATION AND CURETTAGE OF UTERUS    ? DILATION AND CURETTAGE, DIAGNOSTIC / THERAPEUTIC    ? INJECTION KNEE Right 09/14/2021  ? Procedure: KNEE INJECTION;  Surgeon: Marybelle Killings, MD;  Location: [facility];  Service: Orthopedics;  Laterality: Right;  ? KNEE ARTHROSCOPY W/ MENISCAL REPAIR Right 07/16/2017  ? TONSILLECTOMY    ? TOTAL KNEE ARTHROPLASTY Left 09/14/2021  ? Procedure: LEFT TOTAL KNEE ARTHROPLASTY;  Surgeon: Marybelle Killings, MD;  Location: [facility];  Service: Orthopedics;  Laterality: Left;  ? ? ?There were no vitals filed for this visit. ? ? Subjective Assessment - 10/05/21 1351   ? ? Subjective Patient reports that her knee is a little tender today, but not hurting.   ? Pertinent History gout,   ? Limitations Walking;Standing;House hold activities   ? How long can you walk comfortably? 20-30 minutes   ? Patient Stated Goals travel, walk on uneven terrain, navigate steps comfortably (get into her basement), go back to the gym    ? Currently in Pain? No/denies   ? Pain Onset 1 to 4 weeks ago   ? ?  ?  ? ?  ? ? ? ? ? ? ? ? ? ? ? ? ? ? ? ? ? ? ? ? [facility] Adult PT Treatment/Exercise - 10/05/21 0001   ? ?  ? Knee/Hip Exercises: Stretches  ? Passive Hamstring Stretch Left;4 reps;30 seconds   ? Quad Stretch Left;3 reps;30 seconds   ?  ? Knee/Hip Exercises: Aerobic  ? Nustep L2 x 12 minutes   ?  ? Knee/Hip Exercises: Seated  ? Long [employer] Left;20 reps;Weights   ? Long Arc Quad Weight 2 lbs.   ?  ? Knee/Hip Exercises: Supine  ? Heel Slides AROM;Left;20 reps   ? Straight Leg Raises Left;20 reps   ?  ? Modalities  ? Modalities Vasopneumatic   ?  ? Vasopneumatic  ? Number Minutes Vasopneumatic  15 minutes   ? Vasopnuematic Location  Knee   ? Vasopneumatic Pressure Low   ? Vasopneumatic Temperature  34   ? ?  ?  ? ?  ? ? ? ? ? ? ? ? ? ? ? ? ? ? ? PT Long Term Goals - 10/03/21 1437   ? ?  ? PT LONG TERM GOAL #1  ? Title Patient  will be independent with her HEP.   ? Time 5   ? Period Weeks   ? Status New   ? Target Date 11/07/21   ?  ? PT LONG TERM GOAL #2  ? Title Patient will be able to safely navigate at least 4 steps with a reciprical pattern for improved function with household navigation.   ? Time 5   ? Period Weeks   ? Status New   ? Target Date 11/07/21   ?  ? PT LONG TERM GOAL #3  ? Title Patient will be able to demonstrate at least 120 degrees of active left knee flexion for improved function navigating stairs.   ? Time 5   ? Period Weeks   ? Status New   ? Target Date 11/07/21   ?  ? PT LONG TERM GOAL #4  ? Title Patient will be able to walk at least 80 feet without an assistive device for improved function with household navigation.   ? Time 5   ? Period Weeks   ? Status New   ? Target Date 11/07/21   ? ?  ?  ? ?  ? ? ? ? ? ? ? ? Plan - 10/05/21 1353   ? ? Clinical Impression Statement Patient was introduced to multiple new interventions for improved left knee mobility. She required minimal cueing with hamstring and quadriceps stretching for  a prolonged hold to facilitate improved soft tissue extensibility. She reported that her knee felt tired and a little tight upon the conclusion of treatment. She continues to require skilled physical therapy to address her remaining impairments to return to her prior level of function.   ? Personal Factors and Comorbidities Transportation;Other;Comorbidity 2   ? Comorbidities HTN, OA   ? Examination-Activity Limitations Locomotion Level;Transfers;Sleep;Carry;Squat;Stairs;Stand   ? Examination-Participation Restrictions Cleaning;Community Activity;Driving;Shop;Other   ? Stability/Clinical Decision Making Stable/Uncomplicated   ? Rehab Potential Good   ? PT Frequency 2x / week   2-3x / week  ? PT Duration Other (comment)   5 weeks  ? PT Treatment/Interventions ADLs/Self Care Home Management;Cryotherapy;Electrical Stimulation;Moist Heat;Neuromuscular re-education;Therapeutic exercise;Therapeutic activities;Functional mobility training;Stair training;Gait training;Patient/family education;Manual techniques;Passive range of motion;Taping;Vasopneumatic Device   ? PT Next Visit Plan nustep, LAQ, lunges, thomas stretch, AA and AROM interventions for improved knee mobility and strength with modalities as needed   ? PT Home Exercise Plan LAQ, seated knee flexion, heel/toe raises, hamstring stretch  (from [facility])   ? Consulted and Agree with Plan of Care Patient   ? ?  ?  ? ?  ? ? ?Patient will benefit from skilled therapeutic intervention in order to improve the following deficits and impairments:  Abnormal gait, Decreased range of motion, Difficulty walking, Decreased activity tolerance, Pain, Hypomobility, Increased edema, Decreased strength, Decreased mobility ? ?Visit Diagnosis: ?Stiffness of left knee, not elsewhere classified ? ?Acute pain of left knee ? ? ? ? ?Problem List ?Patient Active Problem List  ? Diagnosis Date Noted  ? Unilateral primary osteoarthritis, left knee 09/14/2021  ? Arthritis of left knee 09/14/2021   ? Elevated LFTs 09/01/2020  ? Osteopenia 08/21/2019  ? Bilateral primary osteoarthritis of knee 02/19/2019  ? Obesity (BMI 30-39.9) 09/05/2016  ? Overactive bladder 03/07/2016  ? Vitamin D deficiency 03/07/2016  ? Hypothyroidism 06/01/2014  ? Gastroesophageal reflux disease without esophagitis 11/27/2013  ? Essential hypertension 11/27/2013  ? Mixed hyperlipidemia 10/04/2012  ? ? ?Darlin Coco, PT ?10/05/2021, 2:35 PM ? ?[facility] ?[facility] ?[address] ?[place], [place], [address] ?Phone: [phone]   Fax:  [phone] ? ?Name: Kelly Barajas ?MRN: [id number] ?Date of Birth: Jun 28, 1950 ? ? ? ?

## 2021-10-10 ENCOUNTER — Ambulatory Visit: Payer: PPO | Attending: Orthopaedic Surgery

## 2021-10-10 DIAGNOSIS — M25562 Pain in left knee: Secondary | ICD-10-CM | POA: Diagnosis not present

## 2021-10-10 DIAGNOSIS — M25662 Stiffness of left knee, not elsewhere classified: Secondary | ICD-10-CM | POA: Diagnosis not present

## 2021-10-10 NOTE — Therapy (Signed)
[place] ?[facility] ?[address] ?[place], [place], [address] ?Phone: [phone]   Fax:  [phone] ? ?Physical Therapy Treatment ? ?Patient Details  ?Name: Kelly Barajas ?MRN: [id number] ?Date of Birth: 1951/06/06 ?Referring Provider (PT): Ophelia Charter, MD ? ? ?Encounter Date: 10/10/2021 ? ? PT End of Session - 10/10/21 1350   ? ? Visit Number 3   ? Number of Visits 10   ? Date for PT Re-Evaluation 12/09/21   ? PT Start Time 1345   ? PT Stop Time 1436   ? PT Time Calculation (min) 51 min   ? Activity Tolerance Patient tolerated treatment well   ? Behavior During Therapy [place] for tasks assessed/performed   ? ?  ?  ? ?  ? ? ?Past Medical History:  ?Diagnosis Date  ? Arthritis   ? Essential hypertension 11/27/2013  ? GERD (gastroesophageal reflux disease)   ? Hepatitis   ? Diagnosed 50 years ago, unsure of which type, but she did have jaundice.  ? Hyperlipidemia   ? Hypothyroidism   ? Osteopenia 08/21/2019  ? ? ?Past Surgical History:  ?Procedure Laterality Date  ? DILATION AND CURETTAGE OF UTERUS    ? DILATION AND CURETTAGE, DIAGNOSTIC / THERAPEUTIC    ? INJECTION KNEE Right 09/14/2021  ? Procedure: KNEE INJECTION;  Surgeon: Eldred Manges, MD;  Location: [facility] OR;  Service: Orthopedics;  Laterality: Right;  ? KNEE ARTHROSCOPY W/ MENISCAL REPAIR Right 07/16/2017  ? TONSILLECTOMY    ? TOTAL KNEE ARTHROPLASTY Left 09/14/2021  ? Procedure: LEFT TOTAL KNEE ARTHROPLASTY;  Surgeon: Eldred Manges, MD;  Location: [facility] OR;  Service: Orthopedics;  Laterality: Left;  ? ? ?There were no vitals filed for this visit. ? ? Subjective Assessment - 10/10/21 1349   ? ? Subjective Pt arrives for today's treatment session denying any pain.  Pt states that she is only use SPC in public.   ? Pertinent History gout,   ? Limitations Walking;Standing;House hold activities   ? How long can you walk comfortably? 20-30 minutes   ? Patient Stated Goals travel, walk on uneven terrain, navigate steps comfortably (get into her  basement), go back to the gym   ? Currently in Pain? No/denies   ? Pain Onset 1 to 4 weeks ago   ? ?  ?  ? ?  ? ? ? ? ? ? ? ? ? ? ? ? ? ? ? ? ? ? ? ? [facility] Adult PT Treatment/Exercise - 10/10/21 0001   ? ?  ? Knee/Hip Exercises: Aerobic  ? Nustep Lvl 3 x 15 mins   ?  ? Knee/Hip Exercises: Standing  ? Heel Raises Both;20 reps   ? Heel Raises Limitations Toe Raises x 20 reps   ? Hip Flexion Both   2 mins  ? Hip Abduction Both;20 reps;Knee straight   ? Hip Extension Both;20 reps;Knee straight   ? Rocker Board 4 minutes   ?  ? Knee/Hip Exercises: Seated  ? Long [employer] Left;20 reps;Weights   ? Long Arc Quad Weight 2 lbs.   ?  ? Modalities  ? Modalities Vasopneumatic   ?  ? Vasopneumatic  ? Number Minutes Vasopneumatic  15 minutes   ? Vasopnuematic Location  Knee   ? Vasopneumatic Pressure Low   ? Vasopneumatic Temperature  34   ? ?  ?  ? ?  ? ? ? ? ? ? ? ? ? ? ? ? ? ? ? PT Long Term  Goals - 10/03/21 1437   ? ?  ? PT LONG TERM GOAL #1  ? Title Patient will be independent with her HEP.   ? Time 5   ? Period Weeks   ? Status New   ? Target Date 11/07/21   ?  ? PT LONG TERM GOAL #2  ? Title Patient will be able to safely navigate at least 4 steps with a reciprical pattern for improved function with household navigation.   ? Time 5   ? Period Weeks   ? Status New   ? Target Date 11/07/21   ?  ? PT LONG TERM GOAL #3  ? Title Patient will be able to demonstrate at least 120 degrees of active left knee flexion for improved function navigating stairs.   ? Time 5   ? Period Weeks   ? Status New   ? Target Date 11/07/21   ?  ? PT LONG TERM GOAL #4  ? Title Patient will be able to walk at least 80 feet without an assistive device for improved function with household navigation.   ? Time 5   ? Period Weeks   ? Status New   ? Target Date 11/07/21   ? ?  ?  ? ?  ? ? ? ? ? ? ? ? Plan - 10/10/21 1350   ? ? Clinical Impression Statement Pt arrives for today's treatment session denying any pain.  Pt states that she was able to go to the  grocery store and [employer] this week without issue.  Pt instructed in numerous standing exercises to increase strength, function, and safety while decreasing fall risk and pain.  Pt requiring min cues for proper technique, posture, and to avoid leaning with standing exercises.  Normal responses to vaso noted upon removal.  Pt denied any pain upon completion of today's treatment session.   ? Personal Factors and Comorbidities Transportation;Other;Comorbidity 2   ? Comorbidities HTN, OA   ? Examination-Activity Limitations Locomotion Level;Transfers;Sleep;Carry;Squat;Stairs;Stand   ? Examination-Participation Restrictions Cleaning;Community Activity;Driving;Shop;Other   ? Stability/Clinical Decision Making Stable/Uncomplicated   ? Rehab Potential Good   ? PT Frequency 2x / week   2-3x / week  ? PT Duration Other (comment)   5 weeks  ? PT Treatment/Interventions ADLs/Self Care Home Management;Cryotherapy;Electrical Stimulation;Moist Heat;Neuromuscular re-education;Therapeutic exercise;Therapeutic activities;Functional mobility training;Stair training;Gait training;Patient/family education;Manual techniques;Passive range of motion;Taping;Vasopneumatic Device   ? PT Next Visit Plan nustep, LAQ, lunges, thomas stretch, AA and AROM interventions for improved knee mobility and strength with modalities as needed   ? PT Home Exercise Plan LAQ, seated knee flexion, heel/toe raises, hamstring stretch  [facility]   ? Consulted and Agree with Plan of Care Patient   ? ?  ?  ? ?  ? ? ?Patient will benefit from skilled therapeutic intervention in order to improve the following deficits and impairments:  Abnormal gait, Decreased range of motion, Difficulty walking, Decreased activity tolerance, Pain, Hypomobility, Increased edema, Decreased strength, Decreased mobility ? ?Visit Diagnosis: ?Stiffness of left knee, not elsewhere classified ? ?Acute pain of left knee ? ? ? ? ?Problem List ?Patient Active Problem List  ? Diagnosis Date  Noted  ? Unilateral primary osteoarthritis, left knee 09/14/2021  ? Arthritis of left knee 09/14/2021  ? Elevated LFTs 09/01/2020  ? Osteopenia 08/21/2019  ? Bilateral primary osteoarthritis of knee 02/19/2019  ? Obesity (BMI 30-39.9) 09/05/2016  ? Overactive bladder 03/07/2016  ? Vitamin D deficiency 03/07/2016  ? Hypothyroidism 06/01/2014  ?  Gastroesophageal reflux disease without esophagitis 11/27/2013  ? Essential hypertension 11/27/2013  ? Mixed hyperlipidemia 10/04/2012  ? ? ?Newman Pies, PTA ?10/10/2021, 2:39 PM ? ?[place] ?[facility] ?[address] ?[place], [place], [address] ?Phone: [phone]   Fax:  [phone] ? ?Name: Kelly Barajas ?MRN: [id number] ?Date of Birth: [birth year] ? ? ? ?

## 2021-10-12 ENCOUNTER — Ambulatory Visit: Payer: PPO

## 2021-10-12 DIAGNOSIS — M25562 Pain in left knee: Secondary | ICD-10-CM

## 2021-10-12 DIAGNOSIS — M25662 Stiffness of left knee, not elsewhere classified: Secondary | ICD-10-CM | POA: Diagnosis not present

## 2021-10-12 NOTE — Therapy (Signed)
[place] ?[facility] ?[address] ?[place], [place], [address] ?Phone: [phone]   Fax:  [phone] ? ?Physical Therapy Treatment ? ?Patient Details  ?Name: Kelly Barajas ?MRN: [id number] ?Date of Birth: 27-Aug-1950 ?Referring Provider (PT): Ophelia Charter, MD ? ? ?Encounter Date: 10/12/2021 ? ? PT End of Session - 10/12/21 1355   ? ? Visit Number 4   ? Number of Visits 10   ? Date for PT Re-Evaluation 12/09/21   ? PT Start Time 1347   ? PT Stop Time 1432   ? PT Time Calculation (min) 45 min   ? Activity Tolerance Patient tolerated treatment well   ? Behavior During Therapy [facility] for tasks assessed/performed   ? ?  ?  ? ?  ? ? ?Past Medical History:  ?Diagnosis Date  ? Arthritis   ? Essential hypertension 11/27/2013  ? GERD (gastroesophageal reflux disease)   ? Hepatitis   ? Diagnosed 50 years ago, unsure of which type, but she did have jaundice.  ? Hyperlipidemia   ? Hypothyroidism   ? Osteopenia 08/21/2019  ? ? ?Past Surgical History:  ?Procedure Laterality Date  ? DILATION AND CURETTAGE OF UTERUS    ? DILATION AND CURETTAGE, DIAGNOSTIC / THERAPEUTIC    ? INJECTION KNEE Right 09/14/2021  ? Procedure: KNEE INJECTION;  Surgeon: Eldred Manges, MD;  Location: [facility] OR;  Service: Orthopedics;  Laterality: Right;  ? KNEE ARTHROSCOPY W/ MENISCAL REPAIR Right 07/16/2017  ? TONSILLECTOMY    ? TOTAL KNEE ARTHROPLASTY Left 09/14/2021  ? Procedure: LEFT TOTAL KNEE ARTHROPLASTY;  Surgeon: Eldred Manges, MD;  Location: [facility] OR;  Service: Orthopedics;  Laterality: Left;  ? ? ?There were no vitals filed for this visit. ? ? Subjective Assessment - 10/12/21 1355   ? ? Subjective Patient reports that her knee is not hurting, but her hip and groin are bothering her a little bit. She was a little stiff and sore after her last appointment.   ? Pertinent History gout,   ? Limitations Walking;Standing;House hold activities   ? How long can you walk comfortably? 20-30 minutes   ? Patient Stated Goals travel, walk on  uneven terrain, navigate steps comfortably (get into her basement), go back to the gym   ? Currently in Pain? No/denies   ? Pain Onset 1 to 4 weeks ago   ? ?  ?  ? ?  ? ? ? ? ? [facility] PT Assessment - 10/12/21 0001   ? ?  ? AROM  ? Left Knee Flexion 108   ?  ? PROM  ? Left Knee Flexion 110   ? ?  ?  ? ?  ? ? ? ? ? ? ? ? ? ? ? ? ? ? ? ? [facility] Adult PT Treatment/Exercise - 10/12/21 0001   ? ?  ? Knee/Hip Exercises: Stretches  ? Quad Stretch Left;2 reps;60 seconds   ?  ? Knee/Hip Exercises: Aerobic  ? Recumbent Bike 5 minutes   rocking in available ROM  ? Nustep L4 x 10 minutes   ?  ? Knee/Hip Exercises: Supine  ? Bridges Both;20 reps   ? Straight Leg Raises Left;2 sets;15 reps   ?  ? Modalities  ? Modalities Vasopneumatic   ?  ? Vasopneumatic  ? Number Minutes Vasopneumatic  10 minutes   ? Vasopnuematic Location  Knee   ? Vasopneumatic Pressure Low   ? Vasopneumatic Temperature  34   ?  ? Manual Therapy  ?  Manual Therapy Joint mobilization;Soft tissue mobilization;Passive ROM   ? Joint Mobilization tibiofemoral for extension   ? Soft tissue mobilization quadriceps and hamstrings   ? Passive ROM flexion and extension to tolerance; focus on flexion   ? ?  ?  ? ?  ? ? ? ? ? ? ? ? ? ? ? ? ? ? ? PT Long Term Goals - 10/03/21 1437   ? ?  ? PT LONG TERM GOAL #1  ? Title Patient will be independent with her HEP.   ? Time 5   ? Period Weeks   ? Status New   ? Target Date 11/07/21   ?  ? PT LONG TERM GOAL #2  ? Title Patient will be able to safely navigate at least 4 steps with a reciprical pattern for improved function with household navigation.   ? Time 5   ? Period Weeks   ? Status New   ? Target Date 11/07/21   ?  ? PT LONG TERM GOAL #3  ? Title Patient will be able to demonstrate at least 120 degrees of active left knee flexion for improved function navigating stairs.   ? Time 5   ? Period Weeks   ? Status New   ? Target Date 11/07/21   ?  ? PT LONG TERM GOAL #4  ? Title Patient will be able to walk at least 80 feet without an  assistive device for improved function with household navigation.   ? Time 5   ? Period Weeks   ? Status New   ? Target Date 11/07/21   ? ?  ?  ? ?  ? ? ? ? ? ? ? ? Plan - 10/12/21 1356   ? ? Clinical Impression Statement She was introduced to multiple new interventions for improved knee mobility and strength. She required minimal cueing with straight leg raises to maintain knee extension. Manual therapy focused on improved knee flexion through the use of PROM and soft tissue mobilization to her quadriceps. She reported feeling a little tired and sore upon the conclusion of treatment. She continues to require skilled physical therapy to address her remaining impairments to return to her prior level of function.   ? Personal Factors and Comorbidities Transportation;Other;Comorbidity 2   ? Comorbidities HTN, OA   ? Examination-Activity Limitations Locomotion Level;Transfers;Sleep;Carry;Squat;Stairs;Stand   ? Examination-Participation Restrictions Cleaning;Community Activity;Driving;Shop;Other   ? Stability/Clinical Decision Making Stable/Uncomplicated   ? Rehab Potential Good   ? PT Frequency 2x / week   2-3x / week  ? PT Duration Other (comment)   5 weeks  ? PT Treatment/Interventions ADLs/Self Care Home Management;Cryotherapy;Electrical Stimulation;Moist Heat;Neuromuscular re-education;Therapeutic exercise;Therapeutic activities;Functional mobility training;Stair training;Gait training;Patient/family education;Manual techniques;Passive range of motion;Taping;Vasopneumatic Device   ? PT Next Visit Plan nustep, LAQ, lunges, thomas stretch, AA and AROM interventions for improved knee mobility and strength with modalities as needed   ? PT Home Exercise Plan LAQ, seated knee flexion, heel/toe raises, hamstring stretch  [facility]   ? Consulted and Agree with Plan of Care Patient   ? ?  ?  ? ?  ? ? ?Patient will benefit from skilled therapeutic intervention in order to improve the following deficits and impairments:   Abnormal gait, Decreased range of motion, Difficulty walking, Decreased activity tolerance, Pain, Hypomobility, Increased edema, Decreased strength, Decreased mobility ? ?Visit Diagnosis: ?Stiffness of left knee, not elsewhere classified ? ?Acute pain of left knee ? ? ? ? ?Problem List ?Patient Active Problem List  ?  Diagnosis Date Noted  ? Unilateral primary osteoarthritis, left knee 09/14/2021  ? Arthritis of left knee 09/14/2021  ? Elevated LFTs 09/01/2020  ? Osteopenia 08/21/2019  ? Bilateral primary osteoarthritis of knee 02/19/2019  ? Obesity (BMI 30-39.9) 09/05/2016  ? Overactive bladder 03/07/2016  ? Vitamin D deficiency 03/07/2016  ? Hypothyroidism 06/01/2014  ? Gastroesophageal reflux disease without esophagitis 11/27/2013  ? Essential hypertension 11/27/2013  ? Mixed hyperlipidemia 10/04/2012  ? ? ?Granville Lewis, PT ?10/12/2021, 2:47 PM ? ?[place] ?[facility] ?[address] ?[place], [place], [address] ?Phone: [phone]   Fax:  [phone] ? ?Name: Kelly Barajas ?MRN: [id number] ?Date of Birth: May 08, 1951 ? ? ? ?

## 2021-10-17 ENCOUNTER — Ambulatory Visit: Payer: PPO

## 2021-10-17 ENCOUNTER — Encounter: Payer: Self-pay | Admitting: Orthopaedic Surgery

## 2021-10-17 DIAGNOSIS — M25662 Stiffness of left knee, not elsewhere classified: Secondary | ICD-10-CM | POA: Diagnosis not present

## 2021-10-17 DIAGNOSIS — M25562 Pain in left knee: Secondary | ICD-10-CM

## 2021-10-17 NOTE — Progress Notes (Deleted)
Chronic Care Management Pharmacy Note  10/17/2021 Name:  Kelly Barajas MRN:  [id number] DOB:  18-Oct-1950  Summary: PharmD follow up.  Reports BP has improved since increased lisiopril/HCTZ.  Ranging from 104-125/65-80.  Recommendations/Changes made from today's visit: None continue to monitor BP  Plan: FU 6 months   Subjective: Kelly Barajas is an 71 y.o. year old female who is a primary patient of Leamon Arnt, MD.  The CCM team was consulted for assistance with disease management and care coordination needs.    Engaged with patient by telephone for follow up visit in response to provider referral for pharmacy case management and/or care coordination services.   Consent to Services:  The patient was given the following information about Chronic Care Management services today, agreed to services, and gave verbal consent: 1. CCM service includes personalized support from designated clinical staff supervised by the primary care provider, including individualized plan of care and coordination with other care providers 2. 24/7 contact phone numbers for assistance for urgent and routine care needs. 3. Service will only be billed when office clinical staff spend 20 minutes or more in a month to coordinate care. 4. Only one practitioner may furnish and bill the service in a calendar month. 5.The patient may stop CCM services at any time (effective at the end of the month) by phone call to the office staff. 6. The patient will be responsible for cost sharing (co-pay) of up to 20% of the service fee (after annual deductible is met). Patient agreed to services and consent obtained.  Patient Care Team: Leamon Arnt, MD as PCP - General (Family Medicine) Marybelle Killings, MD as Consulting Physician (Orthopedic Surgery) Obgyn, Windell Hummingbird, Aloha Gell, MD as Consulting Physician (Cardiology) Melina Schools, OD as Consulting Physician (Optometry) Edythe Clarity, [facility]  (Pharmacist)  Recent office visits:  03/08/21 Jonni Sanger) - Zestoretic 20/25 increased dose and monitor BP at home   Recent consult visits:  None   Hospital visits:  None in previous 6 months   Objective:  Lab Results  Component Value Date   CREATININE 0.80 09/15/2021   BUN 13 09/15/2021   GFR 61.01 04/18/2021   GFRNONAA >60 09/15/2021   GFRAA 81 (L) 09/22/2011   NA 130 (L) 09/15/2021   K 4.2 09/15/2021   CALCIUM 8.6 (L) 09/15/2021   CO2 25 09/15/2021   GLUCOSE 119 (H) 09/15/2021    Lab Results  Component Value Date/Time   GFR 61.01 04/18/2021 10:56 AM   GFR 73.26 09/01/2020 09:13 AM    Last diabetic Eye exam: No results found for: HMDIABEYEEXA  Last diabetic Foot exam: No results found for: HMDIABFOOTEX   Lab Results  Component Value Date   CHOL 193 09/13/2021   HDL 45.50 09/13/2021   LDLCALC 110 (H) 09/13/2021   LDLDIRECT 120.0 08/21/2019   TRIG 185.0 (H) 09/13/2021   CHOLHDL 4 09/13/2021       Latest Ref Rng & Units 09/12/2021    3:13 PM 09/01/2020    9:13 AM 08/21/2019    9:23 AM  Hepatic Function  Total Protein 6.5 - 8.1 g/dL 6.7   7.1   7.0    Albumin 3.5 - 5.0 g/dL 4.2   4.6   4.4    AST 15 - 41 U/L 26   24   34    ALT 0 - 44 U/L 22   23   36    Alk Phosphatase 38 - 126 U/L 63  76   87    Total Bilirubin 0.3 - 1.2 mg/dL 0.6   0.8   0.6      Lab Results  Component Value Date/Time   TSH 2.67 09/13/2021 09:16 AM   TSH 2.59 09/01/2020 09:13 AM   FREET4 1.43 08/21/2019 09:23 AM       Latest Ref Rng & Units 09/16/2021    1:30 AM 09/15/2021   12:42 AM 09/12/2021    3:13 PM  CBC  WBC 4.0 - 10.5 K/uL 11.2   12.1   7.7    Hemoglobin 12.0 - 15.0 g/dL 11.7   12.1   14.4    Hematocrit 36.0 - 46.0 % 34.4   35.9   44.3    Platelets 150 - 400 K/uL 211   215   272      Lab Results  Component Value Date/Time   VD25OH 47.81 09/13/2021 09:16 AM   VD25OH 47.45 04/18/2021 10:56 AM    Clinical ASCVD: No  The 10-year ASCVD risk score (Arnett DK, et al., 2019)  is: 16.4%   Values used to calculate the score:     Age: 61 years     Sex: Female     Is Non-Hispanic African American: No     Diabetic: No     Tobacco smoker: No     Systolic Blood Pressure: 440 mmHg     Is BP treated: Yes     HDL Cholesterol: 45.5 mg/dL     Total Cholesterol: 193 mg/dL       09/13/2021    8:39 AM 06/20/2021   11:36 AM 09/01/2020    8:54 AM  Depression screen PHQ 2/9  Decreased Interest 0 0 0  Down, Depressed, Hopeless 0 0 0  PHQ - 2 Score 0 0 0     Social History   Tobacco Use  Smoking Status Never  Smokeless Tobacco Never   BP Readings from Last 3 Encounters:  09/29/21 131/76  09/16/21 120/68  09/13/21 121/84   Pulse Readings from Last 3 Encounters:  09/16/21 90  09/13/21 83  09/12/21 94   Wt Readings from Last 3 Encounters:  09/29/21 193 lb (87.5 kg)  09/14/21 193 lb 12.6 oz (87.9 kg)  09/13/21 193 lb 12.8 oz (87.9 kg)   BMI Readings from Last 3 Encounters:  09/29/21 35.30 kg/m  09/14/21 35.44 kg/m  09/13/21 35.45 kg/m    Assessment/Interventions: Review of patient past medical history, allergies, medications, health status, including review of consultants reports, laboratory and other test data, was performed as part of comprehensive evaluation and provision of chronic care management services.   SDOH:  (Social Determinants of Health) assessments and interventions performed: Yes  Financial Resource Strain: Low Risk    Difficulty of Paying Living Expenses: Not hard at all    SDOH Screenings   Alcohol Screen: Not on file  Depression (PHQ2-9): Low Risk    PHQ-2 Score: 0  Financial Resource Strain: Low Risk    Difficulty of Paying Living Expenses: Not hard at all  Food Insecurity: No Food Insecurity   Worried About Charity fundraiser in the Last Year: Never true   Ran Out of Food in the Last Year: Never true  Housing: Low Risk    Last Housing Risk Score: 0  Physical Activity: Insufficiently Active   Days of Exercise per Week: 4  days   Minutes of Exercise per Session: 30 min  Social Connections: Moderately Isolated   Frequency of Communication  with Friends and Family: More than three times a week   Frequency of Social Gatherings with Friends and Family: More than three times a week   Attends Religious Services: More than 4 times per year   Active Member of [employer] or Organizations: No   Attends Music therapist: Never   Marital Status: Divorced  Stress: No Stress Concern Present   Feeling of Stress : Not at all  Tobacco Use: Low Risk    Smoking Tobacco Use: Never   Smokeless Tobacco Use: Never   Passive Exposure: Not on file  Transportation Needs: No Transportation Needs   Lack of Transportation (Medical): No   Lack of Transportation (Non-Medical): No    CCM Care Plan  Allergies  Allergen Reactions   Nickel Itching    Medications Reviewed Today     Reviewed by Darlin Coco, PT (Physical Therapist) on 10/17/21 at 1350  Med List Status: <None>   Medication Order Taking? Sig Documenting Provider Last Dose Status Informant  aspirin EC 325 MG EC tablet [id number] No Take 1 tablet (325 mg total) by mouth daily with breakfast. MUST TAKE AT LEAST 4 WEEKS POSTOP FOR DVT PROPHYLAXIS Lanae Crumbly, PA-C Taking Active   Calcium Citrate-Vitamin D (CALCIUM + D PO) [id number] No Take 1 tablet by mouth 2 (two) times daily. [provider] Taking Active Self  Coenzyme Q10 (COQ10) 100 MG CAPS [id number] No Take 100 mg by mouth daily. [provider] Taking Active Self  lisinopril-hydrochlorothiazide (ZESTORETIC) 20-12.5 MG tablet [id number] No Take 1 tablet by mouth daily. [provider] Taking Active Self  LIVALO 4 MG TABS [id number] No TAKE 1 TABLET BY MOUTH AT BEDTIME Leamon Arnt, MD Taking Active Self  magnesium gluconate (MAGONATE) 500 MG tablet [id number] No Take 500 mg by mouth in the morning and at bedtime. [provider] Taking Active Self  methocarbamol  (ROBAXIN) 500 MG tablet [id number] No Take 1 tablet (500 mg total) by mouth every 6 (six) hours as needed for muscle spasms. Lanae Crumbly, PA-C Taking Active   Naproxen Sodium 220 MG CAPS [id number]  Take by mouth in the morning and at bedtime. [provider]  Active   Omega-3 Fatty Acids (FISH OIL PO) [id number] No Take 2,000 mg by mouth 2 (two) times daily. [provider] Taking Active Self  omeprazole (PRILOSEC) 20 MG capsule [id number] No Take 20 mg by mouth daily. [provider] Taking Active Self  OVER THE COUNTER MEDICATION [id number] No Inhale 1 puff into the lungs daily as needed (congestion). himalayan pink salt inhaler [provider] Taking Active Self  oxyCODONE-acetaminophen (PERCOCET/ROXICET) 5-325 MG tablet [id number] No Take 1 tablet by mouth every 4 (four) hours as needed for severe pain.  Patient not taking: Reported on 09/29/2021   Herbie Saxon Not Taking Active   SYNTHROID 88 MCG tablet [id number] No TAKE 1 TABLET BY MOUTH ONCE DAILY BEFORE BREAKFAST Leamon Arnt, MD Taking Active Self            Patient Active Problem List   Diagnosis Date Noted   Unilateral primary osteoarthritis, left knee 09/14/2021   Arthritis of left knee 09/14/2021   Elevated LFTs 09/01/2020   Osteopenia 08/21/2019   Bilateral primary osteoarthritis of knee 02/19/2019   Obesity (BMI 30-39.9) 09/05/2016   Overactive bladder 03/07/2016   Vitamin D deficiency 03/07/2016   Hypothyroidism 06/01/2014   Gastroesophageal reflux disease without esophagitis 11/27/2013   Essential hypertension 11/27/2013  Mixed hyperlipidemia 10/04/2012    Immunization History  Administered Date(s) Administered   Fluad Quad(high Dose 65+) 02/19/2019, 03/03/2020, 03/08/2021   Influenza, High Dose Seasonal PF 02/12/2018   Janssen (J&J) SARS-COV-2 Vaccination 10/22/2019, 11/11/2019   Pneumococcal Conjugate-13 09/05/2016   Pneumococcal Polysaccharide-23 08/17/2015,  10/23/2017   Tdap 10/18/2011   Zoster Recombinat (Shingrix) 02/28/2018, 08/28/2018    Conditions to be addressed/monitored:  HTN, GERD, Hypothyroidism, HLD  There are no care plans that you recently modified to display for this patient.     Medication Assistance: None required.  Patient affirms current coverage meets needs.  Compliance/Adherence/Medication fill history: Care Gaps: Patient needs AWV  Star-Rating Drugs: Livalo 58m 02/05/21 90ds  [facility]:  [facility]Phone: [phone]: [phone]   We discussed: Benefits of medication synchronization, packaging and delivery as well as enhanced pharmacist oversight with [employer]. Patient decided to: Continue current medication management strategy  Care Plan and Follow Up Patient Decision:  Patient agrees to Care Plan and Follow-up.  Plan: The care management team will reach out to the patient again over the next 180 days.  CBeverly Milch PharmD Clinical Pharmacist ([phone]   Current Barriers:  None at this time  Pharmacist Clinical Goal(s):  Patient will maintain control of BP as evidenced by home monitoring  through collaboration with PharmD and provider.   Interventions: 1:1 collaboration with ALeamon Arnt MD regarding development and update of comprehensive plan of care as evidenced by provider attestation and co-signature Inter-disciplinary care team collaboration (see longitudinal plan of care) Comprehensive medication review performed; medication list updated in electronic medical record  Hypertension (BP goal <130/80) -Controlled -Current treatment: Lisinopril-HCTZ 20/223mdaily -Medications previously tried: none noted  -Current home readings: 126/80 is the highest it has been, 104/65 is the lowest -Denies hypotensive/hypertensive symptoms -Educated on BP goals and benefits  of medications for prevention of heart attack, stroke and kidney damage; Exercise goal of 150 minutes per week; Importance of home blood pressure monitoring; -Recently increased dose of this med, she reports BP has been much better since she was taking this new dose.  Denies any adverse effects.  BP range has been good, no dizziness associated with the lower BP readings. -Counseled to monitor BP at home a few times per week, document, and provide log at future appointments -Recommended to continue current medication  Hyperlipidemia: (LDL goal < 100) -Not ideally controlled -Current treatment: Livalo 57m18maily -Medications previously tried: none noted  -Educated on Cholesterol goals;  Benefits of statin for ASCVD risk reduction; Importance of limiting foods high in cholesterol; -Recommended to continue current medication -Recheck lipids, consider statin intensification if remains elevated or increases  Hypothyroidism (Goal: Maintain TSH) -Controlled -Current treatment  Synthroid 12m6maily -Medications previously tried: none ntoed -Takes brand name Synthroid, discussed not switching between brand/generic  -TSH in normal -Recommended to continue current medication  Patient Goals/Self-Care Activities Patient will:  - take medications as prescribed focus on medication adherence by pill count check blood pressure daily, document, and provide at future appointments  Follow Up Plan: The care management team will reach out to the patient again over the next 180 days.

## 2021-10-17 NOTE — Therapy (Signed)
[place] ?[facility] ?[address] ?[place], [place], [address] ?Phone: [phone]   Fax:  [phone] ? ?Physical Therapy Treatment ? ?Patient Details  ?Name: Kelly Barajas ?MRN: [id number] ?Date of Birth: 1951/01/01 ?Referring Provider (PT): Lorin Mercy, MD ? ? ?Encounter Date: 10/17/2021 ? ? PT End of Session - 10/17/21 1351   ? ? Visit Number 5   ? Number of Visits 10   ? Date for PT Re-Evaluation 12/09/21   ? PT Start Time 1347   ? PT Stop Time [id number]   ? PT Time Calculation (min) 52 min   ? Activity Tolerance Patient tolerated treatment well   ? Behavior During Therapy [facility] for tasks assessed/performed   ? ?  ?  ? ?  ? ? ?Past Medical History:  ?Diagnosis Date  ? Arthritis   ? Essential hypertension 11/27/2013  ? GERD (gastroesophageal reflux disease)   ? Hepatitis   ? Diagnosed 50 years ago, unsure of which type, but she did have jaundice.  ? Hyperlipidemia   ? Hypothyroidism   ? Osteopenia 08/21/2019  ? ? ?Past Surgical History:  ?Procedure Laterality Date  ? DILATION AND CURETTAGE OF UTERUS    ? DILATION AND CURETTAGE, DIAGNOSTIC / THERAPEUTIC    ? INJECTION KNEE Right 09/14/2021  ? Procedure: KNEE INJECTION;  Surgeon: Marybelle Killings, MD;  Location: [place];  Service: Orthopedics;  Laterality: Right;  ? KNEE ARTHROSCOPY W/ MENISCAL REPAIR Right 07/16/2017  ? TONSILLECTOMY    ? TOTAL KNEE ARTHROPLASTY Left 09/14/2021  ? Procedure: LEFT TOTAL KNEE ARTHROPLASTY;  Surgeon: Marybelle Killings, MD;  Location: [place];  Service: Orthopedics;  Laterality: Left;  ? ? ?There were no vitals filed for this visit. ? ? Subjective Assessment - 10/17/21 1350   ? ? Subjective Patient reports that she over did it over the weekend and her knee got really swollen yesterday. However, it got better with elevating and icing her knee.   ? Pertinent History gout,   ? Limitations Walking;Standing;House hold activities   ? How long can you walk comfortably? 20-30 minutes   ? Patient Stated Goals travel, walk on uneven  terrain, navigate steps comfortably (get into her basement), go back to the gym   ? Currently in Pain? No/denies   ? Pain Onset 1 to 4 weeks ago   ? ?  ?  ? ?  ? ? ? ? ? ? ? ? ? ? ? ? ? ? ? ? ? ? ? ? [facility] PT Treatment/Exercise - 10/17/21 0001   ? ?  ? Knee/Hip Exercises: Aerobic  ? Nustep L4 x 10 minutes   ?  ? Knee/Hip Exercises: Standing  ? Forward Lunges Left;5 seconds   2 minutes  ? Forward Step Up Left;20 reps;Hand Hold: 0;Step Height: 4"   6" step attempted, but unable to be progressed due to pain with this activity  ? Rocker Board 5 minutes   ?  ? Knee/Hip Exercises: Seated  ? Long [employer] Left;20 reps;Weights   ? Long Arc Quad Weight 5 lbs.   ?  ? Modalities  ? Modalities Vasopneumatic   ?  ? Vasopneumatic  ? Number Minutes Vasopneumatic  15 minutes   ? Vasopnuematic Location  Knee   ? Vasopneumatic Pressure Low   ? Vasopneumatic Temperature  34   ? ?  ?  ? ?  ? ? ? ? ? ? ? ? ? ? ? ? ? ? ? PT Long Term  Goals - 10/03/21 1437   ? ?  ? PT LONG TERM GOAL #1  ? Title Patient will be independent with her HEP.   ? Time 5   ? Period Weeks   ? Status New   ? Target Date 11/07/21   ?  ? PT LONG TERM GOAL #2  ? Title Patient will be able to safely navigate at least 4 steps with a reciprical pattern for improved function with household navigation.   ? Time 5   ? Period Weeks   ? Status New   ? Target Date 11/07/21   ?  ? PT LONG TERM GOAL #3  ? Title Patient will be able to demonstrate at least 120 degrees of active left knee flexion for improved function navigating stairs.   ? Time 5   ? Period Weeks   ? Status New   ? Target Date 11/07/21   ?  ? PT LONG TERM GOAL #4  ? Title Patient will be able to walk at least 80 feet without an assistive device for improved function with household navigation.   ? Time 5   ? Period Weeks   ? Status New   ? Target Date 11/07/21   ? ?  ?  ? ?  ? ? ? ? ? ? ? ? Plan - 10/17/21 1352   ? ? Clinical Impression Statement Patient was progressed with multiple new interventions for  improved function with her daily activities. She was attempted to be introduced to 6" step ups, but this had to be modified to a 4" step due to increased pain. However, she experienced no pain or discomfort with this modification or any of today's other activities. She reported that her knee felt good upon the conclusion of treatment. She continues to require skilled physical therapy to address her remaining impairments to return to her prior level of function.   ? Personal Factors and Comorbidities Transportation;Other;Comorbidity 2   ? Comorbidities HTN, OA   ? Examination-Activity Limitations Locomotion Level;Transfers;Sleep;Carry;Squat;Stairs;Stand   ? Examination-Participation Restrictions Cleaning;Community Activity;Driving;Shop;Other   ? Stability/Clinical Decision Making Stable/Uncomplicated   ? Rehab Potential Good   ? PT Frequency 2x / week   2-3x / week  ? PT Duration Other (comment)   5 weeks  ? PT Treatment/Interventions ADLs/Self Care Home Management;Cryotherapy;Electrical Stimulation;Moist Heat;Neuromuscular re-education;Therapeutic exercise;Therapeutic activities;Functional mobility training;Stair training;Gait training;Patient/family education;Manual techniques;Passive range of motion;Taping;Vasopneumatic Device   ? PT Next Visit Plan nustep, LAQ, lunges, thomas stretch, AA and AROM interventions for improved knee mobility and strength with modalities as needed   ? PT Home Exercise Plan LAQ, seated knee flexion, heel/toe raises, hamstring stretch  (from [facility])   ? Consulted and Agree with Plan of Care Patient   ? ?  ?  ? ?  ? ? ?Patient will benefit from skilled therapeutic intervention in order to improve the following deficits and impairments:  Abnormal gait, Decreased range of motion, Difficulty walking, Decreased activity tolerance, Pain, Hypomobility, Increased edema, Decreased strength, Decreased mobility ? ?Visit Diagnosis: ?Stiffness of left knee, not elsewhere classified ? ?Acute pain of  left knee ? ? ? ? ?Problem List ?Patient Active Problem List  ? Diagnosis Date Noted  ? Unilateral primary osteoarthritis, left knee 09/14/2021  ? Arthritis of left knee 09/14/2021  ? Elevated LFTs 09/01/2020  ? Osteopenia 08/21/2019  ? Bilateral primary osteoarthritis of knee 02/19/2019  ? Obesity (BMI 30-39.9) 09/05/2016  ? Overactive bladder 03/07/2016  ? Vitamin D deficiency 03/07/2016  ?  Hypothyroidism 06/01/2014  ? Gastroesophageal reflux disease without esophagitis 11/27/2013  ? Essential hypertension 11/27/2013  ? Mixed hyperlipidemia 10/04/2012  ? ? ?Darlin Coco, PT ?10/17/2021, 3:08 PM ? ?[place] ?[facility] ?[address] ?[place], [place], [address] ?Phone: [phone]   Fax:  [phone] ? ?Name: GITZEL GOWANS ?MRN: [id number] ?Date of Birth: 08-31-1950 ? ? ? ?

## 2021-10-19 ENCOUNTER — Ambulatory Visit: Payer: PPO

## 2021-10-19 DIAGNOSIS — M25562 Pain in left knee: Secondary | ICD-10-CM

## 2021-10-19 DIAGNOSIS — M25662 Stiffness of left knee, not elsewhere classified: Secondary | ICD-10-CM | POA: Diagnosis not present

## 2021-10-19 NOTE — Therapy (Signed)
[place] ?[facility] ?[address] ?[place], [place], [address] ?Phone: [phone]   Fax:  [phone] ? ?Physical Therapy Treatment ? ?Patient Details  ?Name: Kelly Barajas ?MRN: [id number] ?Date of Birth: [birth year] ?Referring Provider (PT): Ophelia Charter, MD ? ? ?Encounter Date: 10/19/2021 ? ? PT End of Session - 10/19/21 1303   ? ? Visit Number 6   ? Number of Visits 10   ? Date for PT Re-Evaluation 12/09/21   ? PT Start Time 1300   ? PT Stop Time 1345   ? PT Time Calculation (min) 45 min   ? Activity Tolerance Patient tolerated treatment well   ? Behavior During Therapy [facility] for tasks assessed/performed   ? ?  ?  ? ?  ? ? ?Past Medical History:  ?Diagnosis Date  ? Arthritis   ? Essential hypertension 11/27/2013  ? GERD (gastroesophageal reflux disease)   ? Hepatitis   ? Diagnosed 50 years ago, unsure of which type, but she did have jaundice.  ? Hyperlipidemia   ? Hypothyroidism   ? Osteopenia 08/21/2019  ? ? ?Past Surgical History:  ?Procedure Laterality Date  ? DILATION AND CURETTAGE OF UTERUS    ? DILATION AND CURETTAGE, DIAGNOSTIC / THERAPEUTIC    ? INJECTION KNEE Right 09/14/2021  ? Procedure: KNEE INJECTION;  Surgeon: Eldred Manges, MD;  Location: [facility] OR;  Service: Orthopedics;  Laterality: Right;  ? KNEE ARTHROSCOPY W/ MENISCAL REPAIR Right 07/16/2017  ? TONSILLECTOMY    ? TOTAL KNEE ARTHROPLASTY Left 09/14/2021  ? Procedure: LEFT TOTAL KNEE ARTHROPLASTY;  Surgeon: Eldred Manges, MD;  Location: [facility] OR;  Service: Orthopedics;  Laterality: Left;  ? ? ?There were no vitals filed for this visit. ? ? Subjective Assessment - 10/19/21 1302   ? ? Subjective Patient reports that her knee is hurting a little today. She notes that it started after her last appointment.   ? Pertinent History gout,   ? Limitations Walking;Standing;House hold activities   ? How long can you walk comfortably? 20-30 minutes   ? Patient Stated Goals travel, walk on uneven terrain, navigate steps comfortably (get  into her basement), go back to the gym   ? Currently in Pain? Yes   ? Pain Score 2    ? Pain Location Knee   ? Pain Orientation Left;Medial   ? Pain Descriptors / Indicators Sore   ? Pain Type Surgical pain   ? Pain Onset 1 to 4 weeks ago   ? ?  ?  ? ?  ? ? ? ? ? ? ? ? ? ? ? ? ? ? ? ? ? ? ? ? [facility] Adult PT Treatment/Exercise - 10/19/21 0001   ? ?  ? Knee/Hip Exercises: Stretches  ? Passive Hamstring Stretch Left;4 reps;30 seconds   ? Quad Stretch Left;4 reps;30 seconds   ? Gastroc Stretch Left;4 reps;30 seconds   ?  ? Knee/Hip Exercises: Aerobic  ? Nustep L4 x 15 minutes   ?  ? Knee/Hip Exercises: Standing  ? Rocker Board 5 minutes   ?  ? Knee/Hip Exercises: Seated  ? Long [employer] Left;Weights   2 minutes  ? Long Arc Quad Weight 5 lbs.   ?  ? Modalities  ? Modalities Vasopneumatic   ?  ? Vasopneumatic  ? Number Minutes Vasopneumatic  10 minutes   ? Vasopnuematic Location  Knee   ? Vasopneumatic Pressure Low   ? Vasopneumatic Temperature  34   ? ?  ?  ? ?  ? ? ? ? ? ? ? ? ? ? ? ? ? ? ?  PT Long Term Goals - 10/03/21 1437   ? ?  ? PT LONG TERM GOAL #1  ? Title Patient will be independent with her HEP.   ? Time 5   ? Period Weeks   ? Status New   ? Target Date 11/07/21   ?  ? PT LONG TERM GOAL #2  ? Title Patient will be able to safely navigate at least 4 steps with a reciprical pattern for improved function with household navigation.   ? Time 5   ? Period Weeks   ? Status New   ? Target Date 11/07/21   ?  ? PT LONG TERM GOAL #3  ? Title Patient will be able to demonstrate at least 120 degrees of active left knee flexion for improved function navigating stairs.   ? Time 5   ? Period Weeks   ? Status New   ? Target Date 11/07/21   ?  ? PT LONG TERM GOAL #4  ? Title Patient will be able to walk at least 80 feet without an assistive device for improved function with household navigation.   ? Time 5   ? Period Weeks   ? Status New   ? Target Date 11/07/21   ? ?  ?  ? ?  ? ? ? ? ? ? ? ? Plan - 10/19/21 1303   ? ? Clinical  Impression Statement Treatment focused on familiar interventions for improved for improved knee mobility. She required minimal cueing with today's activities initially, but was then able to maintain proper exercise performance throughout. She reported feeling tired and sore upon the conclusion of treatment. She continues to require skilled physical therapy to address her remaining impairments to return to her prior level of function.   ? Personal Factors and Comorbidities Transportation;Other;Comorbidity 2   ? Comorbidities HTN, OA   ? Examination-Activity Limitations Locomotion Level;Transfers;Sleep;Carry;Squat;Stairs;Stand   ? Examination-Participation Restrictions Cleaning;Community Activity;Driving;Shop;Other   ? Stability/Clinical Decision Making Stable/Uncomplicated   ? Rehab Potential Good   ? PT Frequency 2x / week   2-3x / week  ? PT Duration Other (comment)   5 weeks  ? PT Treatment/Interventions ADLs/Self Care Home Management;Cryotherapy;Electrical Stimulation;Moist Heat;Neuromuscular re-education;Therapeutic exercise;Therapeutic activities;Functional mobility training;Stair training;Gait training;Patient/family education;Manual techniques;Passive range of motion;Taping;Vasopneumatic Device   ? PT Next Visit Plan nustep, LAQ, lunges, thomas stretch, AA and AROM interventions for improved knee mobility and strength with modalities as needed   ? PT Home Exercise Plan LAQ, seated knee flexion, heel/toe raises, hamstring stretch  (from [facility] PT)   ? Consulted and Agree with Plan of Care Patient   ? ?  ?  ? ?  ? ? ?Patient will benefit from skilled therapeutic intervention in order to improve the following deficits and impairments:  Abnormal gait, Decreased range of motion, Difficulty walking, Decreased activity tolerance, Pain, Hypomobility, Increased edema, Decreased strength, Decreased mobility ? ?Visit Diagnosis: ?Stiffness of left knee, not elsewhere classified ? ?Acute pain of left knee ? ? ? ? ?Problem  List ?Patient Active Problem List  ? Diagnosis Date Noted  ? Unilateral primary osteoarthritis, left knee 09/14/2021  ? Arthritis of left knee 09/14/2021  ? Elevated LFTs 09/01/2020  ? Osteopenia 08/21/2019  ? Bilateral primary osteoarthritis of knee 02/19/2019  ? Obesity (BMI 30-39.9) 09/05/2016  ? Overactive bladder 03/07/2016  ? Vitamin D deficiency 03/07/2016  ? Hypothyroidism 06/01/2014  ? Gastroesophageal reflux disease without esophagitis 11/27/2013  ? Essential hypertension 11/27/2013  ? Mixed hyperlipidemia 10/04/2012  ? ? ?  Granville LewisJarrett C Erial Fikes, PT ?10/19/2021, 1:50 PM ? ?[place] ?[facility] ?[address] ?[place], [place], [address] ?Phone: [phone]   Fax:  [phone] ? ?Name: Kelly Barajas ?MRN: [id number] ?Date of Birth: 01/13/1951 ? ? ? ?

## 2021-10-24 ENCOUNTER — Ambulatory Visit: Payer: PPO

## 2021-10-24 ENCOUNTER — Telehealth: Payer: PPO

## 2021-10-24 DIAGNOSIS — M25662 Stiffness of left knee, not elsewhere classified: Secondary | ICD-10-CM

## 2021-10-24 DIAGNOSIS — M25562 Pain in left knee: Secondary | ICD-10-CM

## 2021-10-24 NOTE — Therapy (Signed)
[place] ?[facility] ?[address] ?[place], [place], [address] ?Phone: [phone]   Fax:  [phone] ? ?Physical Therapy Treatment ? ?Patient Details  ?Name: Kelly Barajas ?MRN: [id number] ?Date of Birth: 04/04/1951 ?Referring Provider (PT): Ophelia CharterYates, MD ? ? ?Encounter Date: 10/24/2021 ? ? PT End of Session - 10/24/21 1318   ? ? Visit Number 7   ? Number of Visits 10   ? Date for PT Re-Evaluation 12/09/21   ? PT Start Time 1300   ? PT Stop Time 1401   ? PT Time Calculation (min) 61 min   ? Activity Tolerance Patient tolerated treatment well   ? Behavior During Therapy [facility] for tasks assessed/performed   ? ?  ?  ? ?  ? ? ?Past Medical History:  ?Diagnosis Date  ? Arthritis   ? Essential hypertension 11/27/2013  ? GERD (gastroesophageal reflux disease)   ? Hepatitis   ? Diagnosed 50 years ago, unsure of which type, but she did have jaundice.  ? Hyperlipidemia   ? Hypothyroidism   ? Osteopenia 08/21/2019  ? ? ?Past Surgical History:  ?Procedure Laterality Date  ? DILATION AND CURETTAGE OF UTERUS    ? DILATION AND CURETTAGE, DIAGNOSTIC / THERAPEUTIC    ? INJECTION KNEE Right 09/14/2021  ? Procedure: KNEE INJECTION;  Surgeon: Eldred MangesYates, Mark C, MD;  [facility];  Service: Orthopedics;  Laterality: Right;  ? KNEE ARTHROSCOPY W/ MENISCAL REPAIR Right 07/16/2017  ? TONSILLECTOMY    ? TOTAL KNEE ARTHROPLASTY Left 09/14/2021  ? Procedure: LEFT TOTAL KNEE ARTHROPLASTY;  Surgeon: Eldred MangesYates, Mark C, MD;  [facility];  Service: Orthopedics;  Laterality: Left;  ? ? ?There were no vitals filed for this visit. ? ? Subjective Assessment - 10/24/21 1302   ? ? Subjective Patient reports that she was on her feet a lot over the weekend which caused her knee to get really swollen. However, it is not bad today.   ? Pertinent History gout,   ? Limitations Walking;Standing;House hold activities   ? How long can you walk comfortably? 20-30 minutes   ? Patient Stated Goals travel, walk on uneven terrain,  navigate steps comfortably (get into her basement), go back to the gym   ? Currently in Pain? No/denies   ? Pain Onset 1 to 4 weeks ago   ? ?  ?  ? ?  ? ? ? ? ? ? ? ? ? ? ? ? ? ? ? ? ? ? ? ? [facility] Adult PT Treatment/Exercise - 10/24/21 0001   ? ?  ? Knee/Hip Exercises: Stretches  ? Quad Stretch Left;4 reps;30 seconds   ?  ? Knee/Hip Exercises: Aerobic  ? Recumbent Bike L2 x 15 minutes   3 minutes backward  ?  ? Knee/Hip Exercises: Machines for Strengthening  ? Cybex Knee Extension 10# x 2 minutes   ? Cybex Knee Flexion 30# x 2 minutes   ?  ? Knee/Hip Exercises: Standing  ? Hip Abduction Both;20 reps;Knee straight   ? Forward Step Up Both;15 reps;Hand Hold: 2;Step Height: 6"   ?  ? Knee/Hip Exercises: Supine  ? Bridges Both   2 minutes  ? Straight Leg Raises Left;20 reps   ?  ? Modalities  ? Modalities Vasopneumatic   ?  ? Vasopneumatic  ? Number Minutes Vasopneumatic  15 minutes   ? Vasopnuematic Location  Knee   ? Vasopneumatic Pressure Low   ? Vasopneumatic Temperature  34   ? ?  ?  ? ?  ? ? ? ? ? ? ? ? ? ? ? ? ? ? ?  PT Long Term Goals - 10/03/21 1437   ? ?  ? PT LONG TERM GOAL #1  ? Title Patient will be independent with her HEP.   ? Time 5   ? Period Weeks   ? Status New   ? Target Date 11/07/21   ?  ? PT LONG TERM GOAL #2  ? Title Patient will be able to safely navigate at least 4 steps with a reciprical pattern for improved function with household navigation.   ? Time 5   ? Period Weeks   ? Status New   ? Target Date 11/07/21   ?  ? PT LONG TERM GOAL #3  ? Title Patient will be able to demonstrate at least 120 degrees of active left knee flexion for improved function navigating stairs.   ? Time 5   ? Period Weeks   ? Status New   ? Target Date 11/07/21   ?  ? PT LONG TERM GOAL #4  ? Title Patient will be able to walk at least 80 feet without an assistive device for improved function with household navigation.   ? Time 5   ? Period Weeks   ? Status New   ? Target Date 11/07/21   ? ?  ?  ? ?  ? ? ? ? ? ? ? ? Plan  - 10/24/21 1318   ? ? Clinical Impression Statement Patient was progressed with multiple new interventions for improved lower extremity strength with moderate difficulty and fatigue. She required minimal cueing with weighted knee extension for a full arc of motion to facilitate quadriceps enagement. She reported that her knee felt tired upon the conclusion of treatment. She continues to require skilled physical therapy to address her remaining impairments to return to her prior level of function.   ? Personal Factors and Comorbidities Transportation;Other;Comorbidity 2   ? Comorbidities HTN, OA   ? Examination-Activity Limitations Locomotion Level;Transfers;Sleep;Carry;Squat;Stairs;Stand   ? Examination-Participation Restrictions Cleaning;Community Activity;Driving;Shop;Other   ? Stability/Clinical Decision Making Stable/Uncomplicated   ? Rehab Potential Good   ? PT Frequency 2x / week   2-3x / week  ? PT Duration Other (comment)   5 weeks  ? PT Treatment/Interventions ADLs/Self Care Home Management;Cryotherapy;Electrical Stimulation;Moist Heat;Neuromuscular re-education;Therapeutic exercise;Therapeutic activities;Functional mobility training;Stair training;Gait training;Patient/family education;Manual techniques;Passive range of motion;Taping;Vasopneumatic Device   ? PT Next Visit Plan nustep, LAQ, lunges, thomas stretch, AA and AROM interventions for improved knee mobility and strength with modalities as needed   ? PT Home Exercise Plan LAQ, seated knee flexion, heel/toe raises, hamstring stretch  (from [facility] PT)   ? Consulted and Agree with Plan of Care Patient   ? ?  ?  ? ?  ? ? ?Patient will benefit from skilled therapeutic intervention in order to improve the following deficits and impairments:  Abnormal gait, Decreased range of motion, Difficulty walking, Decreased activity tolerance, Pain, Hypomobility, Increased edema, Decreased strength, Decreased mobility ? ?Visit Diagnosis: ?Stiffness of left knee, not  elsewhere classified ? ?Acute pain of left knee ? ? ? ? ?Problem List ?Patient Active Problem List  ? Diagnosis Date Noted  ? Unilateral primary osteoarthritis, left knee 09/14/2021  ? Arthritis of left knee 09/14/2021  ? Elevated LFTs 09/01/2020  ? Osteopenia 08/21/2019  ? Bilateral primary osteoarthritis of knee 02/19/2019  ? Obesity (BMI 30-39.9) 09/05/2016  ? Overactive bladder 03/07/2016  ? Vitamin D deficiency 03/07/2016  ? Hypothyroidism 06/01/2014  ? Gastroesophageal reflux disease without esophagitis 11/27/2013  ? Essential hypertension 11/27/2013  ?  Mixed hyperlipidemia 10/04/2012  ? ? ?Granville Lewis, PT ?10/24/2021, 2:30 PM ? ?[place] ?[facility] ?[address] ?[place], [place], [address] ?Phone: [phone]   Fax:  [phone] ? ?Name: Kelly Barajas ?MRN: [id number] ?Date of Birth: July 21, 1950 ? ? ? ?

## 2021-10-26 ENCOUNTER — Ambulatory Visit: Payer: PPO

## 2021-10-26 DIAGNOSIS — M25662 Stiffness of left knee, not elsewhere classified: Secondary | ICD-10-CM | POA: Diagnosis not present

## 2021-10-26 DIAGNOSIS — M25562 Pain in left knee: Secondary | ICD-10-CM

## 2021-10-26 NOTE — Therapy (Signed)
[place] ?[facility] ?[address] ?[place], [place], [address] ?Phone: [phone]   Fax:  [phone] ? ?Physical Therapy Treatment ? ?Patient Details  ?Name: Kelly Barajas ?MRN: [id number] ?Date of Birth: 1951/05/23 ?Referring Provider (PT): Ophelia Charter, MD ? ? ?Encounter Date: 10/26/2021 ? ? PT End of Session - 10/26/21 1314   ? ? Visit Number 8   ? Number of Visits 10   ? Date for PT Re-Evaluation 12/09/21   ? PT Start Time 1300   ? PT Stop Time 1407   ? PT Time Calculation (min) 67 min   ? Activity Tolerance Patient tolerated treatment well   ? Behavior During Therapy [facility] for tasks assessed/performed   ? ?  ?  ? ?  ? ? ?Past Medical History:  ?Diagnosis Date  ? Arthritis   ? Essential hypertension 11/27/2013  ? GERD (gastroesophageal reflux disease)   ? Hepatitis   ? Diagnosed 50 years ago, unsure of which type, but she did have jaundice.  ? Hyperlipidemia   ? Hypothyroidism   ? Osteopenia 08/21/2019  ? ? ?Past Surgical History:  ?Procedure Laterality Date  ? DILATION AND CURETTAGE OF UTERUS    ? DILATION AND CURETTAGE, DIAGNOSTIC / THERAPEUTIC    ? INJECTION KNEE Right 09/14/2021  ? Procedure: KNEE INJECTION;  Surgeon: Eldred Manges, MD;  Location: [facility] OR;  Service: Orthopedics;  Laterality: Right;  ? KNEE ARTHROSCOPY W/ MENISCAL REPAIR Right 07/16/2017  ? TONSILLECTOMY    ? TOTAL KNEE ARTHROPLASTY Left 09/14/2021  ? Procedure: LEFT TOTAL KNEE ARTHROPLASTY;  Surgeon: Eldred Manges, MD;  Location: [facility] OR;  Service: Orthopedics;  Laterality: Left;  ? ? ?There were no vitals filed for this visit. ? ? Subjective Assessment - 10/26/21 1313   ? ? Subjective Patient reports that her knee is really tight and sore today. She notes that she may have over used it on Monday.   ? Pertinent History gout,   ? Limitations Walking;Standing;House hold activities   ? How long can you walk comfortably? 20-30 minutes   ? Patient Stated Goals travel, walk on uneven terrain, navigate steps comfortably (get  into her basement), go back to the gym   ? Currently in Pain? Yes   ? Pain Score 4    ? Pain Location Knee   ? Pain Orientation Left   ? Pain Descriptors / Indicators Sore   ? Pain Onset 1 to 4 weeks ago   ? ?  ?  ? ?  ? ? ? ? ? ? ? ? ? ? ? ? ? ? ? ? ? ? ? ? [facility] Adult PT Treatment/Exercise - 10/26/21 0001   ? ?  ? Knee/Hip Exercises: Stretches  ? Quad Stretch Left;4 reps;30 seconds   ?  ? Knee/Hip Exercises: Aerobic  ? Recumbent Bike L2 x 15 minutes   ?  ? Knee/Hip Exercises: Standing  ? Rocker Board 5 minutes   ?  ? Knee/Hip Exercises: Seated  ? Stool Scoot - Round Trips forward and backward   2 minutes  ?  ? Modalities  ? Modalities Electrical Stimulation;Vasopneumatic   ?  ? Electrical Stimulation  ? Electrical Stimulation Location left hamstring and quadriceps   no redness or adverse reactions to today's modalities  ? Electrical Stimulation Action IFC   ? Electrical Stimulation Parameters 80-150 Hz w/ 40% scan x 17 minutes   ? Electrical Stimulation Goals Pain   ?  ? Vasopneumatic  ? Number Minutes  Vasopneumatic  15 minutes   ? Vasopnuematic Location  Knee   ? Vasopneumatic Pressure Low   ? Vasopneumatic Temperature  34   ?  ? Manual Therapy  ? Manual Therapy Joint mobilization;Soft tissue mobilization;Passive ROM   ? Joint Mobilization tibiofemoral for extension   ? Soft tissue mobilization quadriceps, IT band, and hamstrings   ? Passive ROM flexion and extension to tolerance   ? ?  ?  ? ?  ? ? ? ? ? ? ? ? ? ? ? ? ? ? ? PT Long Term Goals - 10/03/21 1437   ? ?  ? PT LONG TERM GOAL #1  ? Title Patient will be independent with her HEP.   ? Time 5   ? Period Weeks   ? Status New   ? Target Date 11/07/21   ?  ? PT LONG TERM GOAL #2  ? Title Patient will be able to safely navigate at least 4 steps with a reciprical pattern for improved function with household navigation.   ? Time 5   ? Period Weeks   ? Status New   ? Target Date 11/07/21   ?  ? PT LONG TERM GOAL #3  ? Title Patient will be able to demonstrate at  least 120 degrees of active left knee flexion for improved function navigating stairs.   ? Time 5   ? Period Weeks   ? Status New   ? Target Date 11/07/21   ?  ? PT LONG TERM GOAL #4  ? Title Patient will be able to walk at least 80 feet without an assistive device for improved function with household navigation.   ? Time 5   ? Period Weeks   ? Status New   ? Target Date 11/07/21   ? ?  ?  ? ?  ? ? ? ? ? ? ? ? Plan - 10/26/21 1314   ? ? Clinical Impression Statement Patient presented to treatment with increased left knee soreness and tightness since her last appointment. Due to this increase in symptoms, treatment focused on familiar interventions for improved knee mobility with a reduction in her symptoms. Manual therapy focused on soft tissue mobilization to her quadriceps at end range knee flexion and hamstrings in extension. This was followed by appropriately matched interventions for improved soft tissue extensibility. She reported that her knee felt tired, but not as sore or tight upon the conclusion of treatment. She continues to require skilled physical therapy to address her remaining impairments to return to prior level of function.   ? Personal Factors and Comorbidities Transportation;Other;Comorbidity 2   ? Comorbidities HTN, OA   ? Examination-Activity Limitations Locomotion Level;Transfers;Sleep;Carry;Squat;Stairs;Stand   ? Examination-Participation Restrictions Cleaning;Community Activity;Driving;Shop;Other   ? Stability/Clinical Decision Making Stable/Uncomplicated   ? Rehab Potential Good   ? PT Frequency 2x / week   2-3x / week  ? PT Duration Other (comment)   5 weeks  ? PT Treatment/Interventions ADLs/Self Care Home Management;Cryotherapy;Electrical Stimulation;Moist Heat;Neuromuscular re-education;Therapeutic exercise;Therapeutic activities;Functional mobility training;Stair training;Gait training;Patient/family education;Manual techniques;Passive range of motion;Taping;Vasopneumatic Device   ?  PT Next Visit Plan nustep, LAQ, lunges, thomas stretch, AA and AROM interventions for improved knee mobility and strength with modalities as needed   ? PT Home Exercise Plan LAQ, seated knee flexion, heel/toe raises, hamstring stretch  (from [facility] PT)   ? Consulted and Agree with Plan of Care Patient   ? ?  ?  ? ?  ? ? ?Patient will benefit  from skilled therapeutic intervention in order to improve the following deficits and impairments:  Abnormal gait, Decreased range of motion, Difficulty walking, Decreased activity tolerance, Pain, Hypomobility, Increased edema, Decreased strength, Decreased mobility ? ?Visit Diagnosis: ?Stiffness of left knee, not elsewhere classified ? ?Acute pain of left knee ? ? ? ? ?Problem List ?Patient Active Problem List  ? Diagnosis Date Noted  ? Unilateral primary osteoarthritis, left knee 09/14/2021  ? Arthritis of left knee 09/14/2021  ? Elevated LFTs 09/01/2020  ? Osteopenia 08/21/2019  ? Bilateral primary osteoarthritis of knee 02/19/2019  ? Obesity (BMI 30-39.9) 09/05/2016  ? Overactive bladder 03/07/2016  ? Vitamin D deficiency 03/07/2016  ? Hypothyroidism 06/01/2014  ? Gastroesophageal reflux disease without esophagitis 11/27/2013  ? Essential hypertension 11/27/2013  ? Mixed hyperlipidemia 10/04/2012  ? ? ?Granville LewisJarrett C Terrina Docter, PT ?10/26/2021, 2:23 PM ? ?[place] ?[facility] ?[address] ?[place], [place], [address] ?Phone: [phone]   Fax:  [phone] ? ?Name: Kelly Barajas ?MRN: [id number] ?Date of Birth: 01/28/1951 ? ? ? ?

## 2021-10-28 ENCOUNTER — Ambulatory Visit: Payer: PPO | Admitting: Orthopaedic Surgery

## 2021-10-28 ENCOUNTER — Encounter: Payer: Self-pay | Admitting: Orthopaedic Surgery

## 2021-10-28 DIAGNOSIS — Z96652 Presence of left artificial knee joint: Secondary | ICD-10-CM

## 2021-10-28 NOTE — Progress Notes (Signed)
Post-Op Visit Note   Patient: Kelly Barajas           Date of Birth: [birth year]           MRN: [id number] Visit Date: 10/28/2021 PCP: Willow Ora, MD   Assessment & Plan: 6 weeks out left total knee arthroplasty incision looks good still has hop step going up on the step with some quad weakness she can ambulate without a cane.  Knee reaches full extension she is flexing easily gets from sitting to standing.  She needs to continue work on quad strengthening to fix the deficit and I will recheck her in 1 month she is very happy the surgical result.  Chief Complaint:  Chief Complaint  Patient presents with   Left Knee - Follow-up    09/14/2021 Left TKA   Visit Diagnoses:  1. S/P total knee arthroplasty, left     Plan: ROV one month  Follow-Up Instructions: Return in about 1 month (around 11/28/2021).   Orders:  No orders of the defined types were placed in this encounter.  No orders of the defined types were placed in this encounter.   Imaging: No results found.  PMFS History: Patient Active Problem List   Diagnosis Date Noted   S/P total knee arthroplasty, left 10/28/2021   Arthritis of left knee 09/14/2021   Elevated LFTs 09/01/2020   Osteopenia 08/21/2019   Bilateral primary osteoarthritis of knee 02/19/2019   Obesity (BMI 30-39.9) 09/05/2016   Overactive bladder 03/07/2016   Vitamin D deficiency 03/07/2016   Hypothyroidism 06/01/2014   Gastroesophageal reflux disease without esophagitis 11/27/2013   Essential hypertension 11/27/2013   Mixed hyperlipidemia 10/04/2012   Past Medical History:  Diagnosis Date   Arthritis    Essential hypertension 11/27/2013   GERD (gastroesophageal reflux disease)    Hepatitis    Diagnosed 50 years ago, unsure of which type, but she did have jaundice.   Hyperlipidemia    Hypothyroidism    Osteopenia 08/21/2019    Family History  Problem Relation Age of Onset   Hyperlipidemia Mother    Diabetes Mellitus II Mother     CAD Mother    Hypertension Mother    Depression Mother    Diabetes Mother    Arthritis Mother    CAD Father    Hyperlipidemia Father    Heart attack Father    Hypertension Sister    Hypertension Brother    Hypothyroidism Daughter    Colon cancer Maternal Grandmother 56   Hypothyroidism Daughter     Past Surgical History:  Procedure Laterality Date   DILATION AND CURETTAGE OF UTERUS     DILATION AND CURETTAGE, DIAGNOSTIC / THERAPEUTIC     INJECTION KNEE Right 09/14/2021   Procedure: KNEE INJECTION;  Surgeon: Eldred Manges, MD;  Location: [facility];  Service: Orthopedics;  Laterality: Right;   KNEE ARTHROSCOPY W/ MENISCAL REPAIR Right 07/16/2017   TONSILLECTOMY     TOTAL KNEE ARTHROPLASTY Left 09/14/2021   Procedure: LEFT TOTAL KNEE ARTHROPLASTY;  Surgeon: Eldred Manges, MD;  Location: [facility];  Service: Orthopedics;  Laterality: Left;   Social History   Occupational History   Occupation: retired Engineer, agricultural  Tobacco Use   Smoking status: Never   Smokeless tobacco: Never  Vaping Use   Vaping Use: Never used  Substance and Sexual Activity   Alcohol use: Yes    Comment: socially   Drug use: No   Sexual activity: Never  Birth control/protection: Post-menopausal

## 2021-10-31 ENCOUNTER — Ambulatory Visit: Payer: PPO

## 2021-10-31 DIAGNOSIS — M25562 Pain in left knee: Secondary | ICD-10-CM

## 2021-10-31 DIAGNOSIS — M25662 Stiffness of left knee, not elsewhere classified: Secondary | ICD-10-CM | POA: Diagnosis not present

## 2021-10-31 NOTE — Therapy (Signed)
[facility] [place], [place], [address] Phone: [phone]   Fax:  [phone]  Physical Therapy Treatment  Patient Details  Name: Kelly Barajas MRN: [id number] Date of Birth: March 08, 1951 Referring Provider (PT): Lorin Mercy, MD   Encounter Date: 10/31/2021   PT End of Session - 10/31/21 1305     Visit Number 9    Number of Visits 10    Date for PT Re-Evaluation 12/09/21    PT Start Time 1300    PT Stop Time 1402    PT Time Calculation (min) 62 min    Activity Tolerance Patient tolerated treatment well    Behavior During Therapy [facility] for tasks assessed/performed             Past Medical History:  Diagnosis Date   Arthritis    Essential hypertension 11/27/2013   GERD (gastroesophageal reflux disease)    Hepatitis    Diagnosed 50 years ago, unsure of which type, but she did have jaundice.   Hyperlipidemia    Hypothyroidism    Osteopenia 08/21/2019    Past Surgical History:  Procedure Laterality Date   DILATION AND CURETTAGE OF UTERUS     DILATION AND CURETTAGE, DIAGNOSTIC / THERAPEUTIC     INJECTION KNEE Right 09/14/2021   Procedure: KNEE INJECTION;  Surgeon: Marybelle Killings, MD;  Location: [place];  Service: Orthopedics;  Laterality: Right;   KNEE ARTHROSCOPY W/ MENISCAL REPAIR Right 07/16/2017   TONSILLECTOMY     TOTAL KNEE ARTHROPLASTY Left 09/14/2021   Procedure: LEFT TOTAL KNEE ARTHROPLASTY;  Surgeon: Marybelle Killings, MD;  Location: [place];  Service: Orthopedics;  Laterality: Left;    There were no vitals filed for this visit.   Subjective Assessment - 10/31/21 1304     Subjective Pt arrives for today's treatment session reporting 2/10 left knee achiness.  Pt states that she has been very active today prior to treatment session.    Pertinent History gout,    Limitations Walking;Standing;House hold activities    How long can you walk comfortably? 20-30 minutes    Patient Stated Goals travel, walk on uneven  terrain, navigate steps comfortably (get into her basement), go back to the gym    Currently in Pain? Yes    Pain Score 2     Pain Location Knee    Pain Orientation Left    Pain Onset 1 to 4 weeks ago                               [facility] Adult PT Treatment/Exercise - 10/31/21 0001       Knee/Hip Exercises: Stretches   Gastroc Stretch Left;4 reps;30 seconds      Knee/Hip Exercises: Aerobic   Recumbent Bike Lvl 3 x 15 mins   seat 3     Knee/Hip Exercises: Machines for Strengthening   Cybex Knee Extension 20# 2 sets of 15    Cybex Knee Flexion 40# 2 sets of 15    Cybex Leg Press 2 plates 2 sets of 15      Knee/Hip Exercises: Standing   Walking with Sports Cord Blue XTS; 5 reps forward and backward      Modalities   Modalities Psychologist, educational Location Left knee    Electrical Stimulation Action IFC    Electrical Stimulation Parameters 80-150 Hz    Electrical Stimulation Goals  Pain      Vasopneumatic   Number Minutes Vasopneumatic  15 minutes    Vasopnuematic Location  Knee    Vasopneumatic Pressure Low    Vasopneumatic Temperature  34                          PT Long Term Goals - 10/03/21 1437       PT LONG TERM GOAL #1   Title Patient will be independent with her HEP.    Time 5    Period Weeks    Status New    Target Date 11/07/21      PT LONG TERM GOAL #2   Title Patient will be able to safely navigate at least 4 steps with a reciprical pattern for improved function with household navigation.    Time 5    Period Weeks    Status New    Target Date 11/07/21      PT LONG TERM GOAL #3   Title Patient will be able to demonstrate at least 120 degrees of active left knee flexion for improved function navigating stairs.    Time 5    Period Weeks    Status New    Target Date 11/07/21      PT LONG TERM GOAL #4   Title Patient will be able to walk at least  80 feet without an assistive device for improved function with household navigation.    Time 5    Period Weeks    Status New    Target Date 11/07/21                   Plan - 10/31/21 1306     Clinical Impression Statement Pt arrives for today's treatment session reporting 2/10 left knee soreness.  Pt states that she has been very active today prior to treatment session, including navigation flight of stairs twice at home.  Pt able to tolerate increased weight with cybex knee flexion and extension.  Pt introduced to leg press without issue, requiring min cues for eccentric control.  Normal responses to estim and vaso noted upon removal.  Pt reported 0/10 left knee pain at completion of today's treatment session.  Pt states she feels she will be ready for discharge at next session.    Personal Factors and Comorbidities Transportation;Other;Comorbidity 2    Comorbidities HTN, OA    Examination-Activity Limitations Locomotion Level;Transfers;Sleep;Carry;Squat;Stairs;Stand    Examination-Participation Restrictions Cleaning;Community Activity;Driving;Shop;Other    Stability/Clinical Decision Making Stable/Uncomplicated    Rehab Potential Good    PT Frequency 2x / week   2-3x / week   PT Duration Other (comment)   5 weeks   PT Treatment/Interventions ADLs/Self Care Home Management;Cryotherapy;Electrical Stimulation;Moist Heat;Neuromuscular re-education;Therapeutic exercise;Therapeutic activities;Functional mobility training;Stair training;Gait training;Patient/family education;Manual techniques;Passive range of motion;Taping;Vasopneumatic Device    PT Next Visit Plan nustep, LAQ, lunges, thomas stretch, AA and AROM interventions for improved knee mobility and strength with modalities as needed    PT Home Exercise Plan LAQ, seated knee flexion, heel/toe raises, hamstring stretch  (from [facility] PT)    Consulted and Agree with Plan of Care Patient             Patient will benefit from skilled  therapeutic intervention in order to improve the following deficits and impairments:  Abnormal gait, Decreased range of motion, Difficulty walking, Decreased activity tolerance, Pain, Hypomobility, Increased edema, Decreased strength, Decreased mobility  Visit Diagnosis: Stiffness of left knee, not  elsewhere classified  Acute pain of left knee     Problem List Patient Active Problem List   Diagnosis Date Noted   S/P total knee arthroplasty, left 10/28/2021   Arthritis of left knee 09/14/2021   Elevated LFTs 09/01/2020   Osteopenia 08/21/2019   Bilateral primary osteoarthritis of knee 02/19/2019   Obesity (BMI 30-39.9) 09/05/2016   Overactive bladder 03/07/2016   Vitamin D deficiency 03/07/2016   Hypothyroidism 06/01/2014   Gastroesophageal reflux disease without esophagitis 11/27/2013   Essential hypertension 11/27/2013   Mixed hyperlipidemia 10/04/2012    Kathrynn Ducking, PTA 10/31/2021, 2:08 PM  [facility] [address] [place], [place], [address] Phone: [phone]   Fax:  [phone]  Name: Kelly Barajas MRN: [id number] Date of Birth: Aug 12, 1950

## 2021-11-02 ENCOUNTER — Ambulatory Visit: Payer: PPO

## 2021-11-02 DIAGNOSIS — M25662 Stiffness of left knee, not elsewhere classified: Secondary | ICD-10-CM | POA: Diagnosis not present

## 2021-11-02 DIAGNOSIS — M25562 Pain in left knee: Secondary | ICD-10-CM

## 2021-11-02 NOTE — Therapy (Signed)
[facility] [place], [place], [address] Phone: [phone]   Fax:  [phone]  Physical Therapy Treatment  Patient Details  Name: Kelly Barajas MRN: [id number] Date of Birth: November 12, 1950 Referring Provider (PT): Lorin Mercy, MD   Encounter Date: 11/02/2021   PT End of Session - 11/02/21 1033     Visit Number 10    Number of Visits 10    Date for PT Re-Evaluation 12/09/21    PT Start Time 1030    PT Stop Time 1128    PT Time Calculation (min) 58 min    Activity Tolerance Patient tolerated treatment well    Behavior During Therapy [facility] for tasks assessed/performed             Past Medical History:  Diagnosis Date   Arthritis    Essential hypertension 11/27/2013   GERD (gastroesophageal reflux disease)    Hepatitis    Diagnosed 50 years ago, unsure of which type, but she did have jaundice.   Hyperlipidemia    Hypothyroidism    Osteopenia 08/21/2019    Past Surgical History:  Procedure Laterality Date   DILATION AND CURETTAGE OF UTERUS     DILATION AND CURETTAGE, DIAGNOSTIC / THERAPEUTIC     INJECTION KNEE Right 09/14/2021   Procedure: KNEE INJECTION;  Surgeon: Marybelle Killings, MD;  Location: [place];  Service: Orthopedics;  Laterality: Right;   KNEE ARTHROSCOPY W/ MENISCAL REPAIR Right 07/16/2017   TONSILLECTOMY     TOTAL KNEE ARTHROPLASTY Left 09/14/2021   Procedure: LEFT TOTAL KNEE ARTHROPLASTY;  Surgeon: Marybelle Killings, MD;  Location: [place];  Service: Orthopedics;  Laterality: Left;    There were no vitals filed for this visit.   Subjective Assessment - 11/02/21 1032     Subjective Pt arrives for today's treatment session reporting 1/10 left knee pain.  Pt states that she has been vacuuming this morning.    Pertinent History gout,    Limitations Walking;Standing;House hold activities    How long can you walk comfortably? 20-30 minutes    Patient Stated Goals travel, walk on uneven terrain, navigate steps  comfortably (get into her basement), go back to the gym    Currently in Pain? Yes    Pain Score 1     Pain Location Knee    Pain Orientation Left    Pain Onset 1 to 4 weeks ago                               [facility] Adult PT Treatment/Exercise - 11/02/21 0001       Knee/Hip Exercises: Aerobic   Recumbent Bike Lvl 3 x 15 mins   seat 2     Knee/Hip Exercises: Machines for Strengthening   Cybex Knee Extension 20# 2 sets of 15    Cybex Knee Flexion 40# 2 sets of 15    Cybex Leg Press 2 plates 2 sets of 15      Knee/Hip Exercises: Standing   Walking with Sports Cord Blue XTS: all directions 5 reps each   cues for eccentric control     Modalities   Modalities Electrical Stimulation;Vasopneumatic      Electrical Stimulation   Electrical Stimulation Location Left knee    Electrical Stimulation Action IFC    Electrical Stimulation Parameters 80-150 Hz    Electrical Stimulation Goals Pain      Vasopneumatic   Number Minutes Vasopneumatic  15  minutes    Vasopnuematic Location  Knee    Vasopneumatic Pressure Low    Vasopneumatic Temperature  34                          PT Long Term Goals - 11/02/21 1033       PT LONG TERM GOAL #1   Title Patient will be independent with her HEP.    Time 5    Period Weeks    Status Achieved    Target Date 11/07/21      PT LONG TERM GOAL #2   Title Patient will be able to safely navigate at least 4 steps with a reciprical pattern for improved function with household navigation.    Time 5    Period Weeks    Status Achieved    Target Date 11/07/21      PT LONG TERM GOAL #3   Title Patient will be able to demonstrate at least 120 degrees of active left knee flexion for improved function navigating stairs.    Baseline 11/02/21: 124 degrees of active knee flexion    Time 5    Period Weeks    Status New    Target Date 11/07/21      PT LONG TERM GOAL #4   Title Patient will be able to walk at least 80 feet  without an assistive device for improved function with household navigation.    Time 5    Period Weeks    Status Achieved    Target Date 11/07/21                   Plan - 11/02/21 1033     Clinical Impression Statement Pt arrives for today's treatment session reporting 1/10 left knee pain; attributes to vacumming at home this morning.  Pt able to perform recumbent bike at seat 2 today.  Pt has met all of her goals set forth for her at this time.  Pt able to demonstrate 124 degrees of active left knee flexion.  Pt able to increase FOTO score to 67 today.  Normal responses to estim and vaso noted upon removal.  Pt denied any pain upon completion of today's treatment session.  Pt ready for discharge at this time and encouraged to call the facility with any questions or concerns.    Personal Factors and Comorbidities Transportation;Other;Comorbidity 2    Comorbidities HTN, OA    Examination-Activity Limitations Locomotion Level;Transfers;Sleep;Carry;Squat;Stairs;Stand    Examination-Participation Restrictions Cleaning;Community Activity;Driving;Shop;Other    Stability/Clinical Decision Making Stable/Uncomplicated    Rehab Potential Good    PT Frequency 2x / week   2-3x / week   PT Duration Other (comment)   5 weeks   PT Treatment/Interventions ADLs/Self Care Home Management;Cryotherapy;Electrical Stimulation;Moist Heat;Neuromuscular re-education;Therapeutic exercise;Therapeutic activities;Functional mobility training;Stair training;Gait training;Patient/family education;Manual techniques;Passive range of motion;Taping;Vasopneumatic Device    PT Next Visit Plan nustep, LAQ, lunges, thomas stretch, AA and AROM interventions for improved knee mobility and strength with modalities as needed    PT Home Exercise Plan LAQ, seated knee flexion, heel/toe raises, hamstring stretch  (from [facility] PT)    Consulted and Agree with Plan of Care Patient             Patient will benefit from skilled  therapeutic intervention in order to improve the following deficits and impairments:  Abnormal gait, Decreased range of motion, Difficulty walking, Decreased activity tolerance, Pain, Hypomobility, Increased edema, Decreased strength, Decreased mobility  Visit  Diagnosis: Stiffness of left knee, not elsewhere classified  Acute pain of left knee     Problem List Patient Active Problem List   Diagnosis Date Noted   S/P total knee arthroplasty, left 10/28/2021   Arthritis of left knee 09/14/2021   Elevated LFTs 09/01/2020   Osteopenia 08/21/2019   Bilateral primary osteoarthritis of knee 02/19/2019   Obesity (BMI 30-39.9) 09/05/2016   Overactive bladder 03/07/2016   Vitamin D deficiency 03/07/2016   Hypothyroidism 06/01/2014   Gastroesophageal reflux disease without esophagitis 11/27/2013   Essential hypertension 11/27/2013   Mixed hyperlipidemia 10/04/2012    Kathrynn Ducking, PTA 11/02/2021, 11:31 AM  [facility] [place], [place], [address] Phone: [phone]   Fax:  [phone]  Name: GARNETT REKOWSKI MRN: [id number] Date of Birth: 15-Sep-1950  PHYSICAL THERAPY DISCHARGE SUMMARY  Visits from Start of Care: 10  Current functional level related to goals / functional outcomes: Patient was able to meet all her goals for physical therapy and feels comfortable being discharged at this time.    Remaining deficits: None    Education / Equipment: HEP    Patient agrees to discharge. Patient goals were met. Patient is being discharged due to meeting the stated rehab goals.  Jacqulynn Cadet, PT, DPT

## 2021-11-29 ENCOUNTER — Ambulatory Visit: Payer: PPO | Admitting: Orthopaedic Surgery

## 2021-11-29 VITALS — BP 133/83 | HR 90

## 2021-11-29 DIAGNOSIS — Z96652 Presence of left artificial knee joint: Secondary | ICD-10-CM

## 2021-11-29 DIAGNOSIS — M170 Bilateral primary osteoarthritis of knee: Secondary | ICD-10-CM

## 2021-11-29 NOTE — Progress Notes (Signed)
Post-Op Visit Note   Patient: Kelly Barajas           Date of Birth: 05-Jan-1951           MRN: [id number] Visit Date: 11/29/2021 PCP: Willow Ora, MD   Assessment & Plan: Post total knee arthroplasty left now 2 months she has full extension flexes 120 degrees.  Still has slight hop when she gets up on a step and still needs to do some advanced quad strengthening exercises which we discussed.  She has stairs at home and handrail on each side and we discussed single 6 leg toe touches.  Opposite knee has some osteoarthritis and I plan to check her in 3 months.  No x-ray needed on return.  Chief Complaint:  Chief Complaint  Patient presents with   Left Knee - Follow-up   Visit Diagnoses:  1. S/P total knee arthroplasty, left   2. Bilateral primary osteoarthritis of knee     Plan: Recheck 3 months.  Follow-Up Instructions: Return in about 3 months (around 03/01/2022).   Orders:  No orders of the defined types were placed in this encounter.  No orders of the defined types were placed in this encounter.   Imaging: No results found.  PMFS History: Patient Active Problem List   Diagnosis Date Noted   S/P total knee arthroplasty, left 10/28/2021   Arthritis of left knee 09/14/2021   Elevated LFTs 09/01/2020   Osteopenia 08/21/2019   Bilateral primary osteoarthritis of knee 02/19/2019   Obesity (BMI 30-39.9) 09/05/2016   Overactive bladder 03/07/2016   Vitamin D deficiency 03/07/2016   Hypothyroidism 06/01/2014   Gastroesophageal reflux disease without esophagitis 11/27/2013   Essential hypertension 11/27/2013   Mixed hyperlipidemia 10/04/2012   Past Medical History:  Diagnosis Date   Arthritis    Essential hypertension 11/27/2013   GERD (gastroesophageal reflux disease)    Hepatitis    Diagnosed 50 years ago, unsure of which type, but she did have jaundice.   Hyperlipidemia    Hypothyroidism    Osteopenia 08/21/2019    Family History  Problem Relation Age  of Onset   Hyperlipidemia Mother    Diabetes Mellitus II Mother    CAD Mother    Hypertension Mother    Depression Mother    Diabetes Mother    Arthritis Mother    CAD Father    Hyperlipidemia Father    Heart attack Father    Hypertension Sister    Hypertension Brother    Hypothyroidism Daughter    Colon cancer Maternal Grandmother 53   Hypothyroidism Daughter     Past Surgical History:  Procedure Laterality Date   DILATION AND CURETTAGE OF UTERUS     DILATION AND CURETTAGE, DIAGNOSTIC / THERAPEUTIC     INJECTION KNEE Right 09/14/2021   Procedure: KNEE INJECTION;  Surgeon: Eldred Manges, MD;  Location[facility];  Service: Orthopedics;  Laterality: Right;   KNEE ARTHROSCOPY W/ MENISCAL REPAIR Right 07/16/2017   TONSILLECTOMY     TOTAL KNEE ARTHROPLASTY Left 09/14/2021   Procedure: LEFT TOTAL KNEE ARTHROPLASTY;  Surgeon: Eldred Manges, MD;  Location[facility];  Service: Orthopedics;  Laterality: Left;   Social History   Occupational History   Occupation: retired Engineer, agricultural  Tobacco Use   Smoking status: Never   Smokeless tobacco: Never  Vaping Use   Vaping Use: Never used  Substance and Sexual Activity   Alcohol use: Yes    Comment: socially  Drug use: No   Sexual activity: Never    Birth control/protection: Post-menopausal

## 2021-12-02 ENCOUNTER — Other Ambulatory Visit: Payer: Self-pay | Admitting: Family Medicine

## 2021-12-02 ENCOUNTER — Telehealth: Payer: Self-pay | Admitting: *Deleted

## 2021-12-05 ENCOUNTER — Other Ambulatory Visit: Payer: Self-pay | Admitting: Family Medicine

## 2021-12-07 ENCOUNTER — Encounter: Payer: Self-pay | Admitting: Family Medicine

## 2021-12-08 MED ORDER — PRAVASTATIN SODIUM 20 MG PO TABS: 20.0000 mg | ORAL_TABLET | Freq: Every day | ORAL | 1 refills | Status: DC

## 2022-02-02 ENCOUNTER — Other Ambulatory Visit: Payer: Self-pay | Admitting: Family Medicine

## 2022-03-01 ENCOUNTER — Encounter: Payer: Self-pay | Admitting: Orthopaedic Surgery

## 2022-03-01 ENCOUNTER — Ambulatory Visit: Payer: PPO | Admitting: Orthopaedic Surgery

## 2022-03-01 VITALS — BP 134/81 | HR 76 | Ht 62.0 in | Wt 193.0 lb

## 2022-03-01 DIAGNOSIS — Z96652 Presence of left artificial knee joint: Secondary | ICD-10-CM

## 2022-03-01 NOTE — Progress Notes (Signed)
Office Visit Note   Patient: Kelly Barajas           Date of Birth: 05-Dec-1950           MRN: [id number] Visit Date: 03/01/2022              Requested by: Leamon Arnt, [place],  [place] [address] PCP: Leamon Arnt, MD   Assessment & Plan: Visit Diagnoses:  1. S/P total knee arthroplasty, left     Plan: Patient needs to get to the gym which she states she will do and work on her quads we went over knee extensions leg presses wall squats and appropriate exercises to help strengthen her leg including elliptical and treadmill gradually increasing the incline.  Recheck 6 months.  Follow-Up Instructions: Return in about 6 months (around 08/30/2022).   Orders:  No orders of the defined types were placed in this encounter.  No orders of the defined types were placed in this encounter.     Procedures: No procedures performed   Clinical Data: No additional findings.   Subjective: Chief Complaint  Patient presents with   Left Knee - Follow-up    09/14/2021 Left TKA    HPI postop total knee arthroplasty left.  She has not really done additional work in the gym.  She went downstairs changing underclothes for fall noted some problems with her knee she still avoids walking downhill since she is concerned her knee will give way.  She asked about dental cleaning we discussed she does not need antibiotics and then her discharge instruction information which she still has at home.  Opposite knee has osteoarthritis not giving her severe problems at this point.  Review of Systems all systems updated unchanged.   Objective: Vital Signs: BP 134/81   Pulse 76   Ht 5\' 2"  (1.575 m)   Wt 193 lb (87.5 kg)   BMI 35.30 kg/m   Physical Exam Constitutional:      Appearance: She is well-developed.  HENT:     Head: Normocephalic.     Right Ear: External ear normal.     Left Ear: External ear normal. There is no impacted cerumen.  Eyes:     Pupils: Pupils  are equal, round, and reactive to light.  Neck:     Thyroid: No thyromegaly.     Trachea: No tracheal deviation.  Cardiovascular:     Rate and Rhythm: Normal rate.  Pulmonary:     Effort: Pulmonary effort is normal.  Abdominal:     Palpations: Abdomen is soft.  Musculoskeletal:     Cervical back: No rigidity.  Skin:    General: Skin is warm and dry.  Neurological:     Mental Status: She is alert and oriented to person, place, and time.  Psychiatric:        Behavior: Behavior normal.     Ortho Exam healed knee incision no effusion.  Still patient does a hop step when she gets up both right and left does little bit better on the nonoperative right leg than left leg.  Full extension flexion to 120.  Specialty Comments:  No specialty comments available.  Imaging: No results found.   PMFS History: Patient Active Problem List   Diagnosis Date Noted   S/P total knee arthroplasty, left 10/28/2021   Arthritis of left knee 09/14/2021   Elevated LFTs 09/01/2020   Osteopenia 08/21/2019   Bilateral primary osteoarthritis of knee 02/19/2019   Obesity (  BMI 30-39.9) 09/05/2016   Overactive bladder 03/07/2016   Vitamin D deficiency 03/07/2016   Hypothyroidism 06/01/2014   Gastroesophageal reflux disease without esophagitis 11/27/2013   Essential hypertension 11/27/2013   Mixed hyperlipidemia 10/04/2012   Past Medical History:  Diagnosis Date   Arthritis    Essential hypertension 11/27/2013   GERD (gastroesophageal reflux disease)    Hepatitis    Diagnosed 50 years ago, unsure of which type, but she did have jaundice.   Hyperlipidemia    Hypothyroidism    Osteopenia 08/21/2019    Family History  Problem Relation Age of Onset   Hyperlipidemia Mother    Diabetes Mellitus II Mother    CAD Mother    Hypertension Mother    Depression Mother    Diabetes Mother    Arthritis Mother    CAD Father    Hyperlipidemia Father    Heart attack Father    Hypertension Sister     Hypertension Brother    Hypothyroidism Daughter    Colon cancer Maternal Grandmother 42   Hypothyroidism Daughter     Past Surgical History:  Procedure Laterality Date   DILATION AND CURETTAGE OF UTERUS     DILATION AND CURETTAGE, DIAGNOSTIC / THERAPEUTIC     INJECTION KNEE Right 09/14/2021   Procedure: KNEE INJECTION;  Surgeon: Eldred Manges, MD;  Location: [facility] OR;  Service: Orthopedics;  Laterality: Right;   KNEE ARTHROSCOPY W/ MENISCAL REPAIR Right 07/16/2017   TONSILLECTOMY     TOTAL KNEE ARTHROPLASTY Left 09/14/2021   Procedure: LEFT TOTAL KNEE ARTHROPLASTY;  Surgeon: Eldred Manges, MD;  Location: [facility] OR;  Service: Orthopedics;  Laterality: Left;   Social History   Occupational History   Occupation: retired Engineer, agricultural  Tobacco Use   Smoking status: Never   Smokeless tobacco: Never  Vaping Use   Vaping Use: Never used  Substance and Sexual Activity   Alcohol use: Yes    Comment: socially   Drug use: No   Sexual activity: Never    Birth control/protection: Post-menopausal

## 2022-03-01 NOTE — Addendum Note (Signed)
Addended by: Marybelle Killings on: 03/01/2022 09:49 AM   Modules accepted: Orders

## 2022-03-06 ENCOUNTER — Encounter: Payer: Self-pay | Admitting: *Deleted

## 2022-03-07 ENCOUNTER — Telehealth: Payer: Self-pay | Admitting: *Deleted

## 2022-03-07 NOTE — Telephone Encounter (Signed)
Ortho bundle 90 day call completed. 

## 2022-03-15 ENCOUNTER — Encounter: Payer: Self-pay | Admitting: Family Medicine

## 2022-03-15 ENCOUNTER — Ambulatory Visit (INDEPENDENT_AMBULATORY_CARE_PROVIDER_SITE_OTHER): Payer: PPO | Admitting: Family Medicine

## 2022-03-15 VITALS — BP 116/78 | HR 81 | Temp 98.2°F | Ht 62.0 in | Wt 195.6 lb

## 2022-03-15 DIAGNOSIS — I10 Essential (primary) hypertension: Secondary | ICD-10-CM | POA: Diagnosis not present

## 2022-03-15 DIAGNOSIS — E782 Mixed hyperlipidemia: Secondary | ICD-10-CM | POA: Diagnosis not present

## 2022-03-15 DIAGNOSIS — Z96652 Presence of left artificial knee joint: Secondary | ICD-10-CM

## 2022-03-15 DIAGNOSIS — Z23 Encounter for immunization: Secondary | ICD-10-CM | POA: Diagnosis not present

## 2022-03-15 LAB — COMPREHENSIVE METABOLIC PANEL
ALT: 19 U/L (ref 0–35)
AST: 21 U/L (ref 0–37)
Albumin: 4.6 g/dL (ref 3.5–5.2)
Alkaline Phosphatase: 80 U/L (ref 39–117)
BUN: 24 mg/dL — ABNORMAL HIGH (ref 6–23)
CO2: 29 mEq/L (ref 19–32)
Calcium: 10.4 mg/dL (ref 8.4–10.5)
Chloride: 99 mEq/L (ref 96–112)
Creatinine, Ser: 0.92 mg/dL (ref 0.40–1.20)
GFR: 62.21 mL/min (ref >60.00)
Glucose, Bld: 98 mg/dL (ref 70–99)
Potassium: 4.4 mEq/L (ref 3.5–5.1)
Sodium: 136 mEq/L (ref 135–145)
Total Bilirubin: 0.6 mg/dL (ref 0.2–1.2)
Total Protein: 7 g/dL (ref 6.0–8.3)

## 2022-03-15 LAB — LIPID PANEL
Cholesterol: 286 mg/dL — ABNORMAL HIGH (ref 0–200)
HDL: 47.1 mg/dL (ref >39.00)
NonHDL: 238.59
Total CHOL/HDL Ratio: 6
Triglycerides: 250 mg/dL — ABNORMAL HIGH (ref 0.0–149.0)
VLDL: 50 mg/dL — ABNORMAL HIGH (ref 0.0–40.0)

## 2022-03-15 LAB — LDL CHOLESTEROL, DIRECT: Direct LDL: 196 mg/dL

## 2022-03-15 NOTE — Progress Notes (Signed)
Subjective  CC:  Chief Complaint  Patient presents with   Hypertension    HPI: Kelly Barajas is a 71 y.o. female who presents to the office today to address the problems listed above in the chief complaint. Hypertension f/u: Control is good . Pt reports she is doing well. taking medications as instructed, no medication side effects noted, no TIAs, no chest pain on exertion, no dyspnea on exertion, no swelling of ankles. She denies adverse effects from his BP medications. Compliance with medication is good.  HLD: had to change from livalo to pravastatin due to cost. Fortunately is tolerating it well. Fasting for recheck on new med S/p left knee replacement: reviewed notes from Dr. Ophelia Charter. Successful surgery; pain resolved. Working on knee exercises. Walking pain free: recent trip to [place] was wonderful. No leg swelling.   Assessment  1. Essential hypertension   2. S/P total knee arthroplasty, left   3. Mixed hyperlipidemia   4. Need for immunization against influenza      Plan   Hypertension f/u: BP control is well controlled. Continue zestoretic. Check renal and lytes HDL on pravastatin 20; recheck lipids and lfts (h/o elevated lfts that resolved) Knee replacement: successful.  Flu shot today.   Education regarding management of these chronic disease states was given. Management strategies discussed on successive visits include dietary and exercise recommendations, goals of achieving and maintaining IBW, and lifestyle modifications aiming for adequate sleep and minimizing stressors.   Follow up: 6 mo for cpe  Orders Placed This Encounter  Procedures   Flu Vaccine QUAD High Dose(Fluad)   Comprehensive metabolic panel   Lipid panel   No orders of the defined types were placed in this encounter.     BP Readings from Last 3 Encounters:  03/15/22 116/78  03/01/22 134/81  11/29/21 133/83   Wt Readings from Last 3 Encounters:  03/15/22 195 lb 9.6 oz (88.7 kg)   03/01/22 193 lb (87.5 kg)  10/28/21 193 lb (87.5 kg)    Lab Results  Component Value Date   CHOL 193 09/13/2021   CHOL 195 09/01/2020   CHOL 199 08/21/2019   Lab Results  Component Value Date   HDL 45.50 09/13/2021   HDL 46.00 09/01/2020   HDL 43.70 08/21/2019   Lab Results  Component Value Date   LDLCALC 110 (H) 09/13/2021   LDLCALC 114 (H) 09/01/2020   LDLCALC 110 (H) 08/14/2018   Lab Results  Component Value Date   TRIG 185.0 (H) 09/13/2021   TRIG 175.0 (H) 09/01/2020   TRIG 215.0 (H) 08/21/2019   Lab Results  Component Value Date   CHOLHDL 4 09/13/2021   CHOLHDL 4 09/01/2020   CHOLHDL 5 08/21/2019   Lab Results  Component Value Date   LDLDIRECT 120.0 08/21/2019   LDLDIRECT 119.0 08/15/2017   Lab Results  Component Value Date   CREATININE 0.80 09/15/2021   BUN 13 09/15/2021   NA 130 (L) 09/15/2021   K 4.2 09/15/2021   CL 101 09/15/2021   CO2 25 09/15/2021    The 10-year ASCVD risk score (Arnett DK, et al., 2019) is: 13.1%   Values used to calculate the score:     Age: 23 years     Sex: Female     Is Non-Hispanic African American: No     Diabetic: No     Tobacco smoker: No     Systolic Blood Pressure: 116 mmHg     Is BP treated: Yes  HDL Cholesterol: 45.5 mg/dL     Total Cholesterol: 193 mg/dL  I reviewed the patients updated PMH, FH, and SocHx.    Patient Active Problem List   Diagnosis Date Noted   Hypothyroidism 06/01/2014    Priority: High   Essential hypertension 11/27/2013    Priority: High   Mixed hyperlipidemia 10/04/2012    Priority: High   Osteopenia 08/21/2019    Priority: Medium    Gastroesophageal reflux disease without esophagitis 11/27/2013    Priority: Medium    S/P total knee arthroplasty, left 10/28/2021    Priority: Low   Elevated LFTs 09/01/2020    Priority: Low   Bilateral primary osteoarthritis of knee 02/19/2019    Priority: Low   Obesity (BMI 30-39.9) 09/05/2016    Priority: Low   Overactive bladder  03/07/2016    Priority: Low   Vitamin D deficiency 03/07/2016    Priority: Low    Allergies: Nickel  Social History: Patient  reports that she has never smoked. She has never used smokeless tobacco. She reports current alcohol use. She reports that she does not use drugs.  Current Meds  Medication Sig   Calcium Citrate-Vitamin D (CALCIUM + D PO) Take 1 tablet by mouth 2 (two) times daily.   Coenzyme Q10 (COQ10) 100 MG CAPS Take 100 mg by mouth daily.   lisinopril-hydrochlorothiazide (ZESTORETIC) 20-12.5 MG tablet Take 1 tablet by mouth once daily   magnesium gluconate (MAGONATE) 500 MG tablet Take 500 mg by mouth in the morning and at bedtime.   Naproxen Sodium 220 MG CAPS Take by mouth in the morning and at bedtime.   Omega-3 Fatty Acids (FISH OIL PO) Take 2,000 mg by mouth 2 (two) times daily.   omeprazole (PRILOSEC) 20 MG capsule Take 20 mg by mouth daily.   OVER THE COUNTER MEDICATION Inhale 1 puff into the lungs daily as needed (congestion). himalayan pink salt inhaler   pravastatin (PRAVACHOL) 20 MG tablet Take 1 tablet (20 mg total) by mouth daily.   SYNTHROID 88 MCG tablet TAKE 1 TABLET BY MOUTH ONCE DAILY BEFORE BREAKFAST    Review of Systems: Cardiovascular: negative for chest pain, palpitations, leg swelling, orthopnea Respiratory: negative for SOB, wheezing or persistent cough Gastrointestinal: negative for abdominal pain Genitourinary: negative for dysuria or gross hematuria  Objective  Vitals: BP 116/78   Pulse 81   Temp 98.2 F (36.8 C)   Ht 5\' 2"  (1.575 m)   Wt 195 lb 9.6 oz (88.7 kg)   SpO2 98%   BMI 35.78 kg/m  General: no acute distress  Psych:  Alert and oriented, normal mood and affect HEENT:  Normocephalic, atraumatic, supple neck  Cardiovascular:  RRR without murmur. no edema Respiratory:  Good breath sounds bilaterally, CTAB with normal respiratory effort Skin:  Warm, no rashes Left knee: FROM, well healed scar. No swelling. Nl gait Neurologic:    Mental status is normal Commons side effects, risks, benefits, and alternatives for medications and treatment plan prescribed today were discussed, and the patient expressed understanding of the given instructions. Patient is instructed to call or message via MyChart if he/she has any questions or concerns regarding our treatment plan. No barriers to understanding were identified. We discussed Red Flag symptoms and signs in detail. Patient expressed understanding regarding what to do in case of urgent or emergency type symptoms.  Medication list was reconciled, printed and provided to the patient in AVS. Patient instructions and summary information was reviewed with the patient as documented in  the AVS. This note was prepared with assistance of Dragon voice recognition software. Occasional wrong-word or sound-a-like substitutions may have occurred due to the inherent limitation

## 2022-03-15 NOTE — Patient Instructions (Signed)
Please return in 6 months for your annual complete physical; please come fasting.   I will release your lab results to you on your MyChart account with further instructions. You may see the results before I do, but when I review them I will send you a message with my report or have my assistant call you if things need to be discussed. Please reply to my message with any questions. Thank you!   You look like a million bucks!!! :) Take care.   If you have any questions or concerns, please don't hesitate to send me a message via MyChart or call the office at [phone]. Thank you for visiting with [place] today! It's our pleasure caring for you.

## 2022-03-16 ENCOUNTER — Encounter: Payer: Self-pay | Admitting: Family Medicine

## 2022-03-16 MED ORDER — PRAVASTATIN SODIUM 40 MG PO TABS: ORAL_TABLET | ORAL | 3 refills | Status: DC

## 2022-03-16 NOTE — Addendum Note (Signed)
Addended by: Billey Chang on: 03/16/2022 10:00 AM   Modules accepted: Orders

## 2022-04-28 ENCOUNTER — Other Ambulatory Visit: Payer: Self-pay | Admitting: Family Medicine

## 2022-06-06 ENCOUNTER — Ambulatory Visit (INDEPENDENT_AMBULATORY_CARE_PROVIDER_SITE_OTHER): Payer: PPO | Admitting: Family

## 2022-06-06 ENCOUNTER — Encounter: Payer: Self-pay | Admitting: Family

## 2022-06-06 VITALS — BP 154/80 | HR 67 | Temp 97.3°F | Ht 62.0 in | Wt 196.2 lb

## 2022-06-06 DIAGNOSIS — U071 COVID-19: Secondary | ICD-10-CM | POA: Diagnosis not present

## 2022-06-06 MED ORDER — MOLNUPIRAVIR EUA 200MG CAPSULE: 4.0000 | ORAL_CAPSULE | Freq: Two times a day (BID) | ORAL | 0 refills | Status: AC

## 2022-06-06 NOTE — Progress Notes (Signed)
Patient ID: Kelly Barajas, female    DOB: Jan 06, 1951, 71 y.o.   MRN: [id number]  Chief Complaint  Patient presents with   Covid Positive    sx for 3 days    HPI:      COVID sx:  Pt tested positive for Covid positive yesterday. Pt state she has been having a Cough, sneezing and chills for present since 12/23, Sinus max which did relieve symptoms. 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  Assessment & Plan:  1. COVID-19 - Sending Molnupiravir, pt advised of FDA label for emergency use, how to take, & SE. Advised of CDC guidelines for masking if out in public. OK to continue taking OTC sinus or pain meds. Encouraged to monitor & notify office of any worsening symptoms: increased shortness of breath, weakness, and signs of dehydration. Instructed to rest and hydrate well.   - molnupiravir EUA (LAGEVRIO) 200 mg CAPS capsule; Take 4 capsules (800 mg total) by mouth 2 (two) times daily for 5 days.  Dispense: 40 capsule; Refill: 0    Subjective:    Outpatient Medications Prior to Visit  Medication Sig Dispense Refill   Calcium Citrate-Vitamin D (CALCIUM + D PO) Take 1 tablet by mouth 2 (two) times daily.     Coenzyme Q10 (COQ10) 100 MG CAPS Take 100 mg by mouth daily.     lisinopril-hydrochlorothiazide (ZESTORETIC) 20-12.5 MG tablet Take 1 tablet by mouth once daily 90 tablet 90   magnesium gluconate (MAGONATE) 500 MG tablet Take 500 mg by mouth in the morning and at bedtime.     methocarbamol (ROBAXIN) 500 MG tablet Take 1 tablet (500 mg total) by mouth every 6 (six) hours as needed for muscle spasms. 60 tablet 0   Naproxen Sodium 220 MG CAPS Take by mouth in the morning and at bedtime.     Omega-3 Fatty Acids (FISH OIL PO) Take 2,000 mg by mouth 2 (two) times daily.     omeprazole (PRILOSEC) 20 MG capsule Take 20 mg by mouth daily.     OVER THE COUNTER MEDICATION Inhale 1 puff into the lungs daily  as needed (congestion). himalayan pink salt inhaler     pravastatin (PRAVACHOL) 40 MG tablet Take 1 tablet (40 mg total) by mouth at bedtime for 30 days, THEN 2 tablets (80 mg total) at bedtime. 180 tablet 3   SYNTHROID 88 MCG tablet TAKE 1 TABLET BY MOUTH ONCE DAILY BEFORE BREAKFAST 90 tablet 0   No facility-administered medications prior to visit.   Past Medical History:  Diagnosis Date   Arthritis    Essential hypertension 11/27/2013   GERD (gastroesophageal reflux disease)    Hepatitis    Diagnosed 50 years ago, unsure of which type, but she did have jaundice.   Hyperlipidemia    Hypothyroidism    Osteopenia 08/21/2019   Past Surgical History:  Procedure Laterality Date   DILATION AND CURETTAGE OF UTERUS     DILATION AND CURETTAGE, DIAGNOSTIC / THERAPEUTIC     INJECTION KNEE Right 09/14/2021   Procedure: KNEE INJECTION;  Surgeon: Eldred Manges, MD;  Location: [facility] OR;  Service: Orthopedics;  Laterality: Right;   KNEE ARTHROSCOPY W/ MENISCAL REPAIR Right 07/16/2017   TONSILLECTOMY     TOTAL KNEE ARTHROPLASTY Left 09/14/2021   Procedure: LEFT TOTAL KNEE ARTHROPLASTY;  Surgeon: Eldred Manges, MD;  Location: [facility] OR;  Service: Orthopedics;  Laterality: Left;   Allergies  Allergen Reactions   Nickel Itching      Objective:    Physical Exam Vitals and nursing note reviewed.  Constitutional:      Appearance: Normal appearance. She is ill-appearing.     Interventions: Face mask in place.  HENT:     Right Ear: Tympanic membrane and ear canal normal.     Left Ear: Tympanic membrane and ear canal normal.     Nose:     Right Sinus: Frontal sinus tenderness present.     Left Sinus: Frontal sinus tenderness present.     Mouth/Throat:     Mouth: Mucous membranes are moist.     Pharynx: Posterior oropharyngeal erythema present. No pharyngeal swelling, oropharyngeal exudate or uvula swelling.     Tonsils: No tonsillar exudate or tonsillar abscesses.  Cardiovascular:     Rate and Rhythm:  Normal rate and regular rhythm.  Pulmonary:     Effort: Pulmonary effort is normal.     Breath sounds: Normal breath sounds.  Musculoskeletal:        General: Normal range of motion.  Lymphadenopathy:     Head:     Right side of head: No preauricular or posterior auricular adenopathy.     Left side of head: No preauricular or posterior auricular adenopathy.     Cervical: No cervical adenopathy.  Skin:    General: Skin is warm and dry.  Neurological:     Mental Status: She is alert.  Psychiatric:        Mood and Affect: Mood normal.        Behavior: Behavior normal.    BP (!) 154/80 (BP Location: Left Arm, Patient Position: Sitting, Cuff Size: Large)   Pulse 67   Temp (!) 97.3 F (36.3 C) (Temporal)   Ht 5\' 2"  (1.575 m)   Wt 196 lb 4 oz (89 kg)   SpO2 94%   BMI 35.89 kg/m  Wt Readings from Last 3 Encounters:  06/06/22 196 lb 4 oz (89 kg)  03/15/22 195 lb 9.6 oz (88.7 kg)  03/01/22 193 lb (87.5 kg)       03/03/22, NP

## 2022-06-06 NOTE — Patient Instructions (Signed)
It was very nice to see you today!    I have sent over the Covid antiviral, Molnupiravir, to your pharmacy. Start this today. Ok to continue taking your over the counter sinus medications and Aleve for aches, pain, sore throat, or fever. You will want to isolate for 5 days total from starting symptoms. Wear a mask for another 5 days if out in public AND  continue to test positive for Covid, otherwise, no mask needed, unless coughing or have a fever.   Call back if you are not improving by the end of the week.     PLEASE NOTE:  If you had any lab tests please let us know if you have not heard back within a few days. You may see your results on MyChart before we have a chance to review them but we will give you a call once they are reviewed by [place]. If we ordered any referrals today, please let us know if you have not heard from their office within the next week.

## 2022-06-29 ENCOUNTER — Ambulatory Visit (INDEPENDENT_AMBULATORY_CARE_PROVIDER_SITE_OTHER): Payer: PPO

## 2022-06-29 VITALS — Wt 196.0 lb

## 2022-06-29 DIAGNOSIS — Z0000 Encounter for general adult medical examination without abnormal findings: Secondary | ICD-10-CM

## 2022-06-29 DIAGNOSIS — Z1231 Encounter for screening mammogram for malignant neoplasm of breast: Secondary | ICD-10-CM

## 2022-06-29 NOTE — Progress Notes (Addendum)
I connected with  Kelly Barajas on 06/29/22 by a audio enabled telemedicine application and verified that I am speaking with the correct person using two identifiers.  Patient Location: Home  Provider Location: Office/Clinic  I discussed the limitations of evaluation and management by telemedicine. The patient expressed understanding and agreed to proceed.  Subjective:   Kelly Barajas is a 72 y.o. female who presents for Medicare Annual (Subsequent) preventive examination.  Review of Systems     Cardiac Risk Factors include: advanced age (>75men, >8 women);dyslipidemia;hypertension;obesity (BMI >30kg/m2)     Objective:    Today's Vitals   06/29/22 1104  Weight: 196 lb (88.9 kg)   Body mass index is 35.85 kg/m.     06/29/2022   11:08 AM 10/03/2021    3:06 PM 09/14/2021    8:15 PM 09/12/2021    3:07 PM 06/20/2021   11:37 AM 03/24/2019   11:30 AM 02/21/2018    3:21 PM  Advanced Directives  Does Patient Have a Medical Advance Directive? No No No No No No No  Would patient like information on creating a medical advance directive? No - Patient declined  No - Patient declined No - Patient declined Yes (MAU/Ambulatory/Procedural Areas - Information given) Yes (MAU/Ambulatory/Procedural Areas - Information given) Yes (MAU/Ambulatory/Procedural Areas - Information given)    Current Medications (verified) Outpatient Encounter Medications as of 06/29/2022  Medication Sig   Calcium Citrate-Vitamin D (CALCIUM + D PO) Take 1 tablet by mouth 2 (two) times daily.   Coenzyme Q10 (COQ10) 100 MG CAPS Take 100 mg by mouth daily.   lisinopril-hydrochlorothiazide (ZESTORETIC) 20-12.5 MG tablet Take 1 tablet by mouth once daily   magnesium gluconate (MAGONATE) 500 MG tablet Take 500 mg by mouth in the morning and at bedtime.   Naproxen Sodium 220 MG CAPS Take by mouth in the morning and at bedtime.   Omega-3 Fatty Acids (FISH OIL PO) Take 2,000 mg by mouth 2 (two) times daily.   omeprazole  (PRILOSEC) 20 MG capsule Take 20 mg by mouth daily.   OVER THE COUNTER MEDICATION Inhale 1 puff into the lungs daily as needed (congestion). himalayan pink salt inhaler   pravastatin (PRAVACHOL) 40 MG tablet Take 1 tablet (40 mg total) by mouth at bedtime for 30 days, THEN 2 tablets (80 mg total) at bedtime.   SYNTHROID 88 MCG tablet TAKE 1 TABLET BY MOUTH ONCE DAILY BEFORE BREAKFAST   methocarbamol (ROBAXIN) 500 MG tablet Take 1 tablet (500 mg total) by mouth every 6 (six) hours as needed for muscle spasms. (Patient not taking: Reported on 06/29/2022)   No facility-administered encounter medications on file as of 06/29/2022.    Allergies (verified) Nickel   History: Past Medical History:  Diagnosis Date   Arthritis    Essential hypertension 11/27/2013   GERD (gastroesophageal reflux disease)    Hepatitis    Diagnosed 50 years ago, unsure of which type, but she did have jaundice.   Hyperlipidemia    Hypothyroidism    Osteopenia 08/21/2019   Past Surgical History:  Procedure Laterality Date   DILATION AND CURETTAGE OF UTERUS     DILATION AND CURETTAGE, DIAGNOSTIC / THERAPEUTIC     INJECTION KNEE Right 09/14/2021   Procedure: KNEE INJECTION;  Surgeon: Eldred Manges, MD;  Location: [facility];  Service: Orthopedics;  Laterality: Right;   KNEE ARTHROSCOPY W/ MENISCAL REPAIR Right 07/16/2017   TONSILLECTOMY     TOTAL KNEE ARTHROPLASTY Left 09/14/2021   Procedure: LEFT TOTAL KNEE  ARTHROPLASTY;  Surgeon: Marybelle Killings, MD;  Location: [place];  Service: Orthopedics;  Laterality: Left;   Family History  Problem Relation Age of Onset   Hyperlipidemia Mother    Diabetes Mellitus II Mother    CAD Mother    Hypertension Mother    Depression Mother    Diabetes Mother    Arthritis Mother    CAD Father    Hyperlipidemia Father    Heart attack Father    Hypertension Sister    Hypertension Brother    Hypothyroidism Daughter    Colon cancer Maternal Grandmother 33   Hypothyroidism Daughter     Social History   Socioeconomic History   Marital status: Divorced    Spouse name: Not on file   Number of children: 2   Years of education: Not on file   Highest education level: Not on file  Occupational History   Occupation: retired Engineer, water  Tobacco Use   Smoking status: Never   Smokeless tobacco: Never  Vaping Use   Vaping Use: Never used  Substance and Sexual Activity   Alcohol use: Yes    Comment: socially   Drug use: No   Sexual activity: Never    Birth control/protection: Post-menopausal  Other Topics Concern   Not on file  Social History Narrative   Not on file   Social Determinants of Health   Financial Resource Strain: Low Risk  (06/29/2022)   Overall Financial Resource Strain (CARDIA)    Difficulty of Paying Living Expenses: Not hard at all  Food Insecurity: No Food Insecurity (06/29/2022)   Hunger Vital Sign    Worried About Running Out of Food in the Last Year: Never true    Ran Out of Food in the Last Year: Never true  Transportation Needs: No Transportation Needs (06/29/2022)   PRAPARE - Hydrologist (Medical): No    Lack of Transportation (Non-Medical): No  Physical Activity: Inactive (06/29/2022)   Exercise Vital Sign    Days of Exercise per Week: 0 days    Minutes of Exercise per Session: 0 min  Stress: No Stress Concern Present (06/29/2022)   [place]    Feeling of Stress : Not at all  Social Connections: Moderately Integrated (06/29/2022)   Social Connection and Isolation Panel [NHANES]    Frequency of Communication with Friends and Family: More than three times a week    Frequency of Social Gatherings with Friends and Family: More than three times a week    Attends Religious Services: More than 4 times per year    Active Member of [employer] or Organizations: Yes    Attends Archivist Meetings: 1 to 4 times per year     Marital Status: Divorced    Tobacco Counseling Counseling given: Not Answered   Clinical Intake:  Pre-visit preparation completed: Yes  Pain : No/denies pain     BMI - recorded: 35.85 Nutritional Status: BMI > 30  Obese Nutritional Risks: None Diabetes: No  How often do you need to have someone help you when you read instructions, pamphlets, or other written materials from your doctor or pharmacy?: 1 - Never  Diabetic?no  Interpreter Needed?: No  Information entered by :: Charlott Rakes, LPN   Activities of Daily Living    06/29/2022   11:09 AM 09/14/2021    8:00 PM  In your present state of health, do you have any difficulty  performing the following activities:  Hearing? 0   Vision? 0   Difficulty concentrating or making decisions? 0   Walking or climbing stairs? 0   Dressing or bathing? 0   Doing errands, shopping? 0 0  Preparing Food and eating ? N   Using the Toilet? N   In the past six months, have you accidently leaked urine? N   Do you have problems with loss of bowel control? N   Managing your Medications? N   Managing your Finances? N   Housekeeping or managing your Housekeeping? N     Patient Care Team: Leamon Arnt, MD as PCP - General (Family Medicine) Marybelle Killings, MD as Consulting Physician (Orthopedic Surgery) Obgyn, Windell Hummingbird, Aloha Gell, MD as Consulting Physician (Cardiology) Melina Schools, OD as Consulting Physician (Optometry) Edythe Clarity, [facility] (Pharmacist)  Indicate any recent Medical Services you may have received from other than [facility] providers in the past year (date may be approximate).     Assessment:   This is a routine wellness examination for [place].  Hearing/Vision screen Hearing Screening - Comments:: Pt denies any hearing  Vision Screening - Comments:: Pt encouraged to follow up with eye provider   Dietary issues and exercise activities discussed: Current Exercise Habits: The patient does not participate  in regular exercise at present   Goals Addressed             This Visit's Progress    Patient Stated       Stay active        Depression Screen    06/29/2022   11:07 AM 03/15/2022    9:06 AM 09/13/2021    8:39 AM 06/20/2021   11:36 AM 09/01/2020    8:54 AM 03/24/2019   11:30 AM 08/14/2018    8:02 AM  PHQ 2/9 Scores  PHQ - 2 Score 0 0 0 0 0 0 0    Fall Risk    06/29/2022   11:09 AM 03/15/2022    9:06 AM 09/13/2021    8:40 AM 06/20/2021   11:38 AM 09/01/2020    8:54 AM  Fall Risk   Falls in the past year? 0 0 0 0 0  Number falls in past yr: 0 0 0 0 0  Injury with Fall? 0 0 0 0 0  Risk for fall due to : Impaired vision No Fall Risks No Fall Risks    Follow up Falls prevention discussed Falls evaluation completed Falls evaluation completed Falls prevention discussed     FALL RISK PREVENTION PERTAINING TO THE HOME:  Any stairs in or around the home? Yes  If so, are there any without handrails? No  Home free of loose throw rugs in walkways, pet beds, electrical cords, etc? Yes  Adequate lighting in your home to reduce risk of falls? Yes   ASSISTIVE DEVICES UTILIZED TO PREVENT FALLS:  Life alert? No  Use of a cane, walker or w/c? No  Grab bars in the bathroom? No  Shower chair or bench in shower? Yes  Elevated toilet seat or a handicapped toilet? No   TIMED UP AND GO:  Was the test performed? No .   Cognitive Function:    03/24/2019   11:31 AM 02/21/2018    3:23 PM  MMSE - Mini Mental State Exam  Orientation to time 5 5  Orientation to Place 5 5  Registration 3 3  Attention/ Calculation 5 5  Recall 3 3  Language- name 2  objects 2 2  Language- repeat 1 1  Language- follow 3 step command 3 3  Language- read & follow direction 1 1  Write a sentence 1 1  Copy design 1 1  Total score 30 30        06/29/2022   11:10 AM 06/20/2021   11:40 AM  6CIT Screen  What Year? 0 points 0 points  What month? 0 points 0 points  What time? 0 points 0 points  Count back from  20 0 points 0 points  Months in reverse 0 points 0 points  Repeat phrase 0 points 0 points  Total Score 0 points 0 points    Immunizations Immunization History  Administered Date(s) Administered   Fluad Quad(high Dose 65+) 02/19/2019, 03/03/2020, 03/08/2021, 03/15/2022   Influenza, High Dose Seasonal PF 02/12/2018   Janssen (J&J) SARS-COV-2 Vaccination 10/22/2019, 11/11/2019   Pneumococcal Conjugate-13 09/05/2016   Pneumococcal Polysaccharide-23 08/17/2015, 10/23/2017   Tdap 10/18/2011   Zoster Recombinat (Shingrix) 02/28/2018, 08/28/2018    TDAP status: Due, Education has been provided regarding the importance of this vaccine. Advised may receive this vaccine at local pharmacy or Health Dept. Aware to provide a copy of the vaccination record if obtained from local pharmacy or Health Dept. Verbalized acceptance and understanding.  Flu Vaccine status: Up to date  Pneumococcal vaccine status: Up to date  Covid-19 vaccine status: Completed vaccines  Qualifies for Shingles Vaccine? Yes   Zostavax completed Yes   Shingrix Completed?: Yes  Screening Tests Health Maintenance  Topic Date Due   DTaP/Tdap/Td (2 - Td or Tdap) 10/17/2021   COVID-19 Vaccine (3 - 2023-24 season) 02/10/2022   MAMMOGRAM  05/11/2022   Medicare Annual Wellness (AWV)  06/30/2023   DEXA SCAN  03/03/2024   COLONOSCOPY (Pts 45-85yrs Insurance coverage will need to be confirmed)  03/26/2025   Pneumonia Vaccine 26+ Years old  Completed   INFLUENZA VACCINE  Completed   Hepatitis C Screening  Completed   Zoster Vaccines- Shingrix  Completed   HPV VACCINES  Aged Out    Health Maintenance  Health Maintenance Due  Topic Date Due   DTaP/Tdap/Td (2 - Td or Tdap) 10/17/2021   COVID-19 Vaccine (3 - 2023-24 season) 02/10/2022   MAMMOGRAM  05/11/2022    Colorectal cancer screening: Type of screening: Colonoscopy. Completed 03/27/15. Repeat every 10 years  Mammogram status: Ordered 06/29/22. Pt provided with  contact info and advised to call to schedule appt.   Bone Density status: Completed 03/03/21. Results reflect: Bone density results: OSTEOPENIA. Repeat every 3 years.   Additional Screening:  Hepatitis C Screening:  Completed 08/15/17  Vision Screening: Recommended annual ophthalmology exams for early detection of glaucoma and other disorders of the eye. Is the patient up to date with their annual eye exam?  No  Who is the provider or what is the name of the office in which the patient attends annual eye exams? Encouraged to follow up with provider  If pt is not established with a provider, would they like to be referred to a provider to establish care? No .   Dental Screening: Recommended annual dental exams for proper oral hygiene  Community Resource Referral / Chronic Care Management: CRR required this visit?  No   CCM required this visit?  No      Plan:     I have personally reviewed and noted the following in the patient's chart:   Medical and social history Use of alcohol, tobacco or illicit drugs  Current medications and supplements including opioid prescriptions. Patient is not currently taking opioid prescriptions. Functional ability and status Nutritional status Physical activity Advanced directives List of other physicians Hospitalizations, surgeries, and ER visits in previous 12 months Vitals Screenings to include cognitive, depression, and falls Referrals and appointments  In addition, I have reviewed and discussed with patient certain preventive protocols, quality metrics, and best practice recommendations. A written personalized care plan for preventive services as well as general preventive health recommendations were provided to patient.     Marzella Schlein, LPN   [id number]   Nurse Notes: none

## 2022-08-05 ENCOUNTER — Other Ambulatory Visit: Payer: Self-pay | Admitting: Family Medicine

## 2022-08-21 ENCOUNTER — Ambulatory Visit
Admission: RE | Admit: 2022-08-21 | Discharge: 2022-08-21 | Disposition: A | Discharge status: Home / Self Care | Payer: PPO | Source: Ambulatory Visit | Attending: Family Medicine | Admitting: Family Medicine

## 2022-08-21 DIAGNOSIS — Z1231 Encounter for screening mammogram for malignant neoplasm of breast: Secondary | ICD-10-CM

## 2022-08-24 ENCOUNTER — Other Ambulatory Visit: Payer: Self-pay | Admitting: Family Medicine

## 2022-08-24 DIAGNOSIS — R928 Other abnormal and inconclusive findings on diagnostic imaging of breast: Secondary | ICD-10-CM

## 2022-08-30 ENCOUNTER — Ambulatory Visit: Payer: PPO | Admitting: Orthopaedic Surgery

## 2022-09-05 ENCOUNTER — Ambulatory Visit
Admission: RE | Admit: 2022-09-05 | Discharge: 2022-09-05 | Disposition: A | Discharge status: Home / Self Care | Payer: PPO | Source: Ambulatory Visit | Attending: Family Medicine | Admitting: Family Medicine

## 2022-09-05 DIAGNOSIS — R928 Other abnormal and inconclusive findings on diagnostic imaging of breast: Secondary | ICD-10-CM

## 2022-09-13 ENCOUNTER — Encounter: Payer: Self-pay | Admitting: Family Medicine

## 2022-09-13 ENCOUNTER — Ambulatory Visit (INDEPENDENT_AMBULATORY_CARE_PROVIDER_SITE_OTHER): Payer: PPO | Admitting: Family Medicine

## 2022-09-13 VITALS — BP 126/78 | HR 98 | Temp 98.3°F | Ht 62.0 in | Wt 191.2 lb

## 2022-09-13 DIAGNOSIS — Z1331 Encounter for screening for depression: Secondary | ICD-10-CM

## 2022-09-13 DIAGNOSIS — Z0000 Encounter for general adult medical examination without abnormal findings: Secondary | ICD-10-CM | POA: Diagnosis not present

## 2022-09-13 DIAGNOSIS — N3281 Overactive bladder: Secondary | ICD-10-CM

## 2022-09-13 DIAGNOSIS — E039 Hypothyroidism, unspecified: Secondary | ICD-10-CM | POA: Diagnosis not present

## 2022-09-13 DIAGNOSIS — I10 Essential (primary) hypertension: Secondary | ICD-10-CM | POA: Diagnosis not present

## 2022-09-13 DIAGNOSIS — R7989 Other specified abnormal findings of blood chemistry: Secondary | ICD-10-CM | POA: Diagnosis not present

## 2022-09-13 DIAGNOSIS — T466X5A Adverse effect of antihyperlipidemic and antiarteriosclerotic drugs, initial encounter: Secondary | ICD-10-CM

## 2022-09-13 DIAGNOSIS — M791 Myalgia, unspecified site: Secondary | ICD-10-CM

## 2022-09-13 DIAGNOSIS — E559 Vitamin D deficiency, unspecified: Secondary | ICD-10-CM

## 2022-09-13 DIAGNOSIS — R011 Cardiac murmur, unspecified: Secondary | ICD-10-CM | POA: Diagnosis not present

## 2022-09-13 DIAGNOSIS — E782 Mixed hyperlipidemia: Secondary | ICD-10-CM

## 2022-09-13 LAB — CBC WITH DIFFERENTIAL/PLATELET
Basophils Absolute: 0.1 10*3/uL (ref 0.0–0.1)
Basophils Relative: 1.3 % (ref 0.0–3.0)
Eosinophils Absolute: 0.2 10*3/uL (ref 0.0–0.7)
Eosinophils Relative: 3.8 % (ref 0.0–5.0)
HCT: 41.2 % (ref 36.0–46.0)
Hemoglobin: 13.5 g/dL (ref 12.0–15.0)
Lymphocytes Relative: 19.2 % (ref 12.0–46.0)
Lymphs Abs: 1.2 10*3/uL (ref 0.7–4.0)
MCHC: 32.8 g/dL (ref 30.0–36.0)
MCV: 85.3 fl (ref 78.0–100.0)
Monocytes Absolute: 0.7 10*3/uL (ref 0.1–1.0)
Monocytes Relative: 10.8 % (ref 3.0–12.0)
Neutro Abs: 4.2 10*3/uL (ref 1.4–7.7)
Neutrophils Relative %: 64.9 % (ref 43.0–77.0)
Platelets: 318 10*3/uL (ref 150.0–400.0)
RBC: 4.83 Mil/uL (ref 3.87–5.11)
RDW: 15.8 % — ABNORMAL HIGH (ref 11.5–15.5)
WBC: 6.4 10*3/uL (ref 4.0–10.5)

## 2022-09-13 LAB — COMPREHENSIVE METABOLIC PANEL
ALT: 15 U/L (ref 0–35)
AST: 20 U/L (ref 0–37)
Albumin: 4.5 g/dL (ref 3.5–5.2)
Alkaline Phosphatase: 97 U/L (ref 39–117)
BUN: 25 mg/dL — ABNORMAL HIGH (ref 6–23)
CO2: 28 mEq/L (ref 19–32)
Calcium: 10 mg/dL (ref 8.4–10.5)
Chloride: 101 mEq/L (ref 96–112)
Creatinine, Ser: 0.94 mg/dL (ref 0.40–1.20)
GFR: 60.41 mL/min (ref >60.00)
Glucose, Bld: 98 mg/dL (ref 70–99)
Potassium: 4.4 mEq/L (ref 3.5–5.1)
Sodium: 137 mEq/L (ref 135–145)
Total Bilirubin: 0.4 mg/dL (ref 0.2–1.2)
Total Protein: 6.8 g/dL (ref 6.0–8.3)

## 2022-09-13 LAB — LIPID PANEL
Cholesterol: 324 mg/dL — ABNORMAL HIGH (ref 0–200)
HDL: 40.3 mg/dL (ref >39.00)
NonHDL: 284.13
Total CHOL/HDL Ratio: 8
Triglycerides: 274 mg/dL — ABNORMAL HIGH (ref 0.0–149.0)
VLDL: 54.8 mg/dL — ABNORMAL HIGH (ref 0.0–40.0)

## 2022-09-13 LAB — LDL CHOLESTEROL, DIRECT: Direct LDL: 207 mg/dL

## 2022-09-13 LAB — CK: Total CK: 74 U/L (ref 7–177)

## 2022-09-13 LAB — VITAMIN D 25 HYDROXY (VIT D DEFICIENCY, FRACTURES): VITD: 38.1 ng/mL (ref 30.00–100.00)

## 2022-09-13 LAB — TSH: TSH: 2.5 u[IU]/mL (ref 0.35–5.50)

## 2022-09-13 NOTE — Patient Instructions (Signed)
Please return in 4 weeks for recheck  I will release your lab results to you on your MyChart account with further instructions. You may see the results before I do, but when I review them I will send you a message with my report or have my assistant call you if things need to be discussed. Please reply to my message with any questions. Thank you!   Stop your statin for now.  We will consider restarting livalo if able.   If you have any questions or concerns, please don't hesitate to send me a message via MyChart or call the office at [phone]. Thank you for visiting with [place] today! It's our pleasure caring for you.

## 2022-09-13 NOTE — Progress Notes (Signed)
Subjective  Chief Complaint  Patient presents with   Annual Exam    Pt here for Annual Exam and is not currently fasting     HPI: Kelly Barajas is a 72 y.o. female who presents to [facility] today for a Female Wellness Visit. She also has the concerns and/or needs as listed above in the chief complaint. These will be addressed in addition to the Health Maintenance Visit.   Wellness Visit: annual visit with health maintenance review and exam without Pap  Health maintenance: Screens are current.  Immunizations are up-to-date. Chronic disease f/u and/or acute problem visit: (deemed necessary to be done in addition to the wellness visit): Complains of myalgias: Reports she has been doing well for many months.  Recently, increased muscle pain in legs extremities and low back.  Moderate to severe and very limiting.  Travel to [facility] and could not keep up with her co travelers.  She finally went the increase in pain to her statin.  We had changed to pravastatin at the end of last year due to cost.  Prior to that she was tolerating Livalo.  She stopped pravastatin about a week ago.  Starting to feel better.  Energy is coming back.  Her depression screen is very positive right now and this is all due to how she has been dealing with her pain. Hyperlipidemia: Discussed treatment options.  Livalo is slightly expensive but may be affordable overall.  She cannot tolerate any other statin.  She possibly could qualify for Repatha but this likely would be expensive as well.  She is nonfasting today for recheck Lab Results  Component Value Date   CHOL 286 (H) 03/15/2022   CHOL 193 09/13/2021   CHOL 195 09/01/2020   Lab Results  Component Value Date   HDL 47.10 03/15/2022   HDL 45.50 09/13/2021   HDL 46.00 09/01/2020   Lab Results  Component Value Date   LDLCALC 110 (H) 09/13/2021   LDLCALC 114 (H) 09/01/2020   LDLCALC 110 (H) 08/14/2018   Lab Results   Component Value Date   TRIG 250.0 (H) 03/15/2022   TRIG 185.0 (H) 09/13/2021   TRIG 175.0 (H) 09/01/2020   Lab Results  Component Value Date   CHOLHDL 6 03/15/2022   CHOLHDL 4 09/13/2021   CHOLHDL 4 09/01/2020   Lab Results  Component Value Date   LDLDIRECT 196.0 03/15/2022   LDLDIRECT 120.0 08/21/2019   LDLDIRECT 119.0 08/15/2017   Hypothyroidism: Clinically feels well, tired but she believes this is related to her pain.  Due for recheck.  Compliant with medications History of elevated LFTs presumably related to fatty liver.  No right upper quadrant pain or jaundice. Review of systems negative for chest pain, shortness of breath or lightheadedness.  She does have a systolic murmur  Assessment  1. Annual physical exam   2. Essential hypertension   3. Mixed hyperlipidemia   4. Acquired hypothyroidism   5. Overactive bladder   6. Positive screening for depression on 9-item Patient Health Questionnaire (PHQ-9)   7. Elevated LFTs   8. Myalgia due to statin   9. Vitamin D deficiency   10. Systolic murmur      Plan  Female Wellness Visit: Age appropriate Health Maintenance and Prevention measures were discussed with patient. Included topics are cancer screening recommendations, ways to keep healthy (see AVS) including dietary and exercise recommendations, regular eye and dental care, use of seat belts, and avoidance of  moderate alcohol use and tobacco use.  Screens are current BMI: discussed patient's BMI and encouraged positive lifestyle modifications to help get to or maintain a target BMI. HM needs and immunizations were addressed and ordered. See below for orders. See HM and immunization section for updates. Routine labs and screening tests ordered including cmp, cbc and lipids where appropriate. Discussed recommendations regarding Vit D and calcium supplementation (see AVS)  Chronic disease management visit and/or acute problem visit: Statin myalgia and hyperlipidemia:  Likely this is the cause of her pain.  She also is describing some lumbar back pain.  Recommend holding pravastatin and rechecking in 4 weeks.  If back pain persist, will need further evaluation for this.  We discussed treatment for hyperlipidemia strategies include Livalo if affordable.  This is likely her best option.  Could also try Zetia but she has a very high LDL and this would not likely get her to goal.  Could refer to lipid clinic as well. Recheck thyroid levels on Synthroid 88 mcg daily. Positive depression screen is related to pain: Will recheck again in 4 weeks. Monitor LFTs Recheck vitamin D levels and CK given muscle pain Hypertension is well-controlled on Zestoretic 20/12.5 daily.  Recheck renal function and electrolytes today. Heart murmur: Echocardiogram to assess for aortic stenosis.  Follow up: 4 weeks for recheck Orders Placed This Encounter  Procedures   CBC with Differential/Platelet   Comprehensive metabolic panel   Lipid panel   TSH   CK (Creatine Kinase)   VITAMIN D 25 Hydroxy (Vit-D Deficiency, Fractures)   ECHOCARDIOGRAM COMPLETE   No orders of the defined types were placed in this encounter.     Body mass index is 34.97 kg/m. Wt Readings from Last 3 Encounters:  09/13/22 191 lb 3.2 oz (86.7 kg)  06/29/22 196 lb (88.9 kg)  06/06/22 196 lb 4 oz (89 kg)     Patient Active Problem List   Diagnosis Date Noted   Hypothyroidism 06/01/2014    Priority: High   Essential hypertension 11/27/2013    Priority: High    Negative Stress Test 2017: false positive EKG changes.    Mixed hyperlipidemia 10/04/2012    Priority: High    Intolerant to crestor and lipitor: myalgias and mm cramps; tolerating livalo, had to chang to pravastatin 2023 due to cost but severe myalgias returned    Osteopenia 08/21/2019    Priority: Medium     Dexa 02/2021 lowest T =-1.8, on caltrate plus bid and exercise    Gastroesophageal reflux disease without esophagitis 11/27/2013     Priority: Medium    S/P total knee arthroplasty, left 10/28/2021    Priority: Low   Elevated LFTs 09/01/2020    Priority: Low    Improved over time; neg hep b and c. Likely fatty liver. Never imaged. [email or web address]    Bilateral primary osteoarthritis of knee 02/19/2019    Priority: Low   Obesity (BMI 30-39.9) 09/05/2016    Priority: Low   Overactive bladder 03/07/2016    Priority: Low   Vitamin D deficiency 03/07/2016    Priority: [facility] Maintenance  Topic Date Due   DTaP/Tdap/Td (2 - Td or Tdap) 10/17/2021   COVID-19 Vaccine (3 - 2023-24 season) 09/29/2022 (Originally 02/10/2022)   INFLUENZA VACCINE  01/11/2023   Medicare Annual Wellness (AWV)  06/30/2023   MAMMOGRAM  08/21/2023   DEXA SCAN  03/03/2024   COLONOSCOPY (Pts 45-59yrs Insurance coverage will need to be confirmed)  03/26/2025   Pneumonia Vaccine  89+ Years old  Completed   Hepatitis C Screening  Completed   Zoster Vaccines- Shingrix  Completed   HPV VACCINES  Aged Out   Immunization History  Administered Date(s) Administered   Fluad Quad(high Dose 65+) 02/19/2019, 03/03/2020, 03/08/2021, 03/15/2022   Influenza, High Dose Seasonal PF 02/12/2018   Janssen (J&J) SARS-COV-2 Vaccination 10/22/2019, 11/11/2019   Pneumococcal Conjugate-13 09/05/2016   Pneumococcal Polysaccharide-23 08/17/2015, 10/23/2017   Tdap 10/18/2011   Zoster Recombinat (Shingrix) 02/28/2018, 08/28/2018   We updated and reviewed the patient's past history in detail and it is documented below. Allergies: Patient is allergic to nickel. Past Medical History Patient  has a past medical history of Allergy, Arthritis, Essential hypertension (11/27/2013), GERD (gastroesophageal reflux disease), Hepatitis, Hyperlipidemia, Hypothyroidism, and Osteopenia (08/21/2019). Past Surgical History Patient  has a past surgical history that includes Tonsillectomy; Dilation and curettage, diagnostic / therapeutic; Knee arthroscopy w/ meniscal repair (Right,  07/16/2017); Dilation and curettage of uterus; Total knee arthroplasty (Left, 09/14/2021); and Injection knee (Right, 09/14/2021). Family History: Patient family history includes Arthritis in her mother; CAD in her father and mother; Colon cancer (age of onset: 27) in her maternal grandmother; Depression in her mother; Diabetes in her mother; Diabetes Mellitus II in her mother; Heart attack in her father; Hyperlipidemia in her father and mother; Hypertension in her brother, mother, and sister; Hypothyroidism in her daughter and daughter. Social History:  Patient  reports that she has never smoked. She has never used smokeless tobacco. She reports current alcohol use. She reports that she does not use drugs.  Review of Systems: Constitutional: negative for fever or malaise Ophthalmic: negative for photophobia, double vision or loss of vision Cardiovascular: negative for chest pain, dyspnea on exertion, or new LE swelling Respiratory: negative for SOB or persistent cough Gastrointestinal: negative for abdominal pain, change in bowel habits or melena Genitourinary: negative for dysuria or gross hematuria, no abnormal uterine bleeding or disharge Musculoskeletal: negative for new gait disturbance or muscular weakness Integumentary: negative for new or persistent rashes, no breast lumps Neurological: negative for TIA or stroke symptoms Psychiatric: negative for SI or delusions Allergic/Immunologic: negative for hives  Patient Care Team    Relationship Specialty Notifications Start End  Leamon Arnt, MD PCP - General Family Medicine  02/12/18   Marybelle Killings, MD Consulting Physician Orthopedic Surgery  08/15/17   Obgyn, Erling Conte    02/21/18   Satira Sark, MD Consulting Physician Cardiology  02/21/18   Melina Schools, OD Consulting Physician Optometry  03/24/19   Edythe Clarity, [facility]  Pharmacist  04/05/21    Comment: [phone]    Objective  Vitals: BP 126/78   Pulse 98   Temp 98.3 F  (36.8 C)   Ht 5\' 2"  (1.575 m)   Wt 191 lb 3.2 oz (86.7 kg)   SpO2 96%   BMI 34.97 kg/m  General:  Well developed, well nourished, no acute distress  Psych:  Alert and orientedx3,normal mood and affect HEENT:  Normocephalic, atraumatic, non-icteric sclera,  supple neck without adenopathy, mass or thyromegaly Cardiovascular:  Normal S1, S2, RRR 2/6 systolic murmur left upper sternal border  respiratory:  Good breath sounds bilaterally, CTAB with normal respiratory effort Gastrointestinal: normal bowel sounds, soft, non-tender, no noted masses. No HSM MSK: no deformities, contusions. Joints are without erythema or swelling.  No muscle tenderness Skin:  Warm, no rashes or suspicious lesions noted   Commons side effects, risks, benefits, and alternatives for medications and treatment plan prescribed today  were discussed, and the patient expressed understanding of the given instructions. Patient is instructed to call or message via MyChart if he/she has any questions or concerns regarding our treatment plan. No barriers to understanding were identified. We discussed Red Flag symptoms and signs in detail. Patient expressed understanding regarding what to do in case of urgent or emergency type symptoms.  Medication list was reconciled, printed and provided to the patient in AVS. Patient instructions and summary information was reviewed with the patient as documented in the AVS. This note was prepared with assistance of Dragon voice recognition software. Occasional wrong-word or sound-a-like substitutions may have occurred due to the inherent limitations of voice recognition software

## 2022-09-20 ENCOUNTER — Ambulatory Visit (HOSPITAL_COMMUNITY): Payer: PPO | Attending: Family Medicine

## 2022-09-20 DIAGNOSIS — R011 Cardiac murmur, unspecified: Secondary | ICD-10-CM | POA: Diagnosis not present

## 2022-09-20 LAB — ECHOCARDIOGRAM COMPLETE
Area-P 1/2: 2.93 cm2
S' Lateral: 2.2 cm

## 2022-09-28 ENCOUNTER — Encounter: Payer: Self-pay | Admitting: Family Medicine

## 2022-09-28 DIAGNOSIS — I358 Other nonrheumatic aortic valve disorders: Secondary | ICD-10-CM | POA: Insufficient documentation

## 2022-10-24 ENCOUNTER — Ambulatory Visit (INDEPENDENT_AMBULATORY_CARE_PROVIDER_SITE_OTHER): Payer: PPO | Admitting: Family Medicine

## 2022-10-24 ENCOUNTER — Encounter: Payer: Self-pay | Admitting: Family Medicine

## 2022-10-24 VITALS — BP 136/76 | HR 80 | Temp 98.3°F | Ht 62.0 in | Wt 189.6 lb

## 2022-10-24 DIAGNOSIS — I10 Essential (primary) hypertension: Secondary | ICD-10-CM

## 2022-10-24 DIAGNOSIS — J301 Allergic rhinitis due to pollen: Secondary | ICD-10-CM

## 2022-10-24 DIAGNOSIS — T466X5A Adverse effect of antihyperlipidemic and antiarteriosclerotic drugs, initial encounter: Secondary | ICD-10-CM | POA: Diagnosis not present

## 2022-10-24 DIAGNOSIS — M791 Myalgia, unspecified site: Secondary | ICD-10-CM | POA: Diagnosis not present

## 2022-10-24 DIAGNOSIS — E782 Mixed hyperlipidemia: Secondary | ICD-10-CM

## 2022-10-24 MED ORDER — PITAVASTATIN CALCIUM 4 MG PO TABS: 4.0000 mg | ORAL_TABLET | Freq: Every day | ORAL | 3 refills | Status: DC

## 2022-10-24 NOTE — Patient Instructions (Signed)
Please return in 3 months to recheck cholesterol levels.  Please come fasting  You may use over-the-counter generic Zyrtec, called cetirizine, 10 mg nightly.  You may add Flonase if needed for sinus congestion.  Please avoid decongestants like Sudafed  If you have any questions or concerns, please don't hesitate to send me a message via MyChart or call the office at [phone]. Thank you for visiting with [place] today! It's our pleasure caring for you.

## 2022-10-24 NOTE — Progress Notes (Signed)
Subjective  CC:  Chief Complaint  Patient presents with   Hypertension   Hyperlipidemia    HPI: Kelly Barajas is a 72 y.o. female who presents to the office today to address the problems listed above in the chief complaint. This is a follow-up for her back pain and myalgias but to be statin related.  She has been off statins now for 4 weeks and feels completely better.  She has been traveling.  Climbing stairs.  Carrying luggage.  No further muscle pains, spasms or back pain.  Energy is better.  Workup revealed normal CK, GFR, vitamin D and TSH levels. Blood pressure remains controlled.  It is mildly elevated in the office today due to using a decongestant for seasonal allergies Allergies are active: Not taking allergy medicines.  No fever or infectious symptoms.  Assessment  1. Mixed hyperlipidemia   2. Essential hypertension   3. Myalgia due to statin   4. Seasonal allergic rhinitis due to pollen      Plan  Hyperlipidemia, likely familial: Restart Livalo 4 mg nightly.  Recheck 3 months fasting lipids.  She will let me know if not tolerating it.  Would consider lipid clinic in that case Seasonal allergies, start Zyrtec over-the-counter.  Can add Flonase if needed.  Education given. Blood pressure fairly well-controlled: Stop decongestants.  Follow up: 3 months recheck fasting lipids on Livalo Visit date not found  No orders of the defined types were placed in this encounter.  Meds ordered this encounter  Medications   Pitavastatin Calcium (LIVALO) 4 MG TABS    Sig: Take 1 tablet (4 mg total) by mouth daily.    Dispense:  90 tablet    Refill:  3      I reviewed the patients updated PMH, FH, and SocHx.    Patient Active Problem List   Diagnosis Date Noted   Hypothyroidism 06/01/2014    Priority: High   Essential hypertension 11/27/2013    Priority: High   Mixed hyperlipidemia 10/04/2012    Priority: High   Osteopenia 08/21/2019    Priority: Medium     Gastroesophageal reflux disease without esophagitis 11/27/2013    Priority: Medium    Aortic valve sclerosis systolic heart murmur 09/28/2022    Priority: Low   S/P total knee arthroplasty, left 10/28/2021    Priority: Low   Elevated LFTs 09/01/2020    Priority: Low   Bilateral primary osteoarthritis of knee 02/19/2019    Priority: Low   Obesity (BMI 30-39.9) 09/05/2016    Priority: Low   Overactive bladder 03/07/2016    Priority: Low   Vitamin D deficiency 03/07/2016    Priority: Low   Allergic rhinitis due to pollen 10/24/2022   Current Meds  Medication Sig   Calcium Citrate-Vitamin D (CALCIUM + D PO) Take 1 tablet by mouth 2 (two) times daily.   Coenzyme Q10 (COQ10) 100 MG CAPS Take 100 mg by mouth daily.   lisinopril-hydrochlorothiazide (ZESTORETIC) 20-12.5 MG tablet Take 1 tablet by mouth once daily   magnesium gluconate (MAGONATE) 500 MG tablet Take 500 mg by mouth in the morning and at bedtime.   Naproxen Sodium 220 MG CAPS Take by mouth in the morning and at bedtime.   Omega-3 Fatty Acids (FISH OIL PO) Take 2,000 mg by mouth 2 (two) times daily.   omeprazole (PRILOSEC) 20 MG capsule Take 20 mg by mouth daily.   OVER THE COUNTER MEDICATION Inhale 1 puff into the lungs daily as needed (  congestion). himalayan pink salt inhaler   Pitavastatin Calcium (LIVALO) 4 MG TABS Take 1 tablet (4 mg total) by mouth daily.   SYNTHROID 88 MCG tablet TAKE 1 TABLET BY MOUTH ONCE DAILY BEFORE BREAKFAST    Allergies: Patient is allergic to nickel. Family History: Patient family history includes Arthritis in her mother; CAD in her father and mother; Colon cancer (age of onset: 38) in her maternal grandmother; Depression in her mother; Diabetes in her mother; Diabetes Mellitus II in her mother; Heart attack in her father; Hyperlipidemia in her father and mother; Hypertension in her brother, mother, and sister; Hypothyroidism in her daughter and daughter. Social History:  Patient  reports that  she has never smoked. She has never used smokeless tobacco. She reports current alcohol use. She reports that she does not use drugs.  Review of Systems: Constitutional: Negative for fever malaise or anorexia Cardiovascular: negative for chest pain Respiratory: negative for SOB or persistent cough Gastrointestinal: negative for abdominal pain  Objective  Vitals: BP 136/76   Pulse 80   Temp 98.3 F (36.8 C)   Ht 5\' 2"  (1.575 m)   Wt 189 lb 9.6 oz (86 kg)   SpO2 96%   BMI 34.68 kg/m  General: no acute distress , A&Ox3 HEENT: PEERL, conjunctiva normal, neck is supple Cardiovascular:  RRR without murmur or gallop.  Respiratory:  Good breath sounds bilaterally, CTAB with normal respiratory effort Skin:  Warm, no rashes  Commons side effects, risks, benefits, and alternatives for medications and treatment plan prescribed today were discussed, and the patient expressed understanding of the given instructions. Patient is instructed to call or message via MyChart if he/she has any questions or concerns regarding our treatment plan. No barriers to understanding were identified. We discussed Red Flag symptoms and signs in detail. Patient expressed understanding regarding what to do in case of urgent or emergency type symptoms.  Medication list was reconciled, printed and provided to the patient in AVS. Patient instructions and summary information was reviewed with the patient as documented in the AVS. This note was prepared with assistance of Dragon voice recognition software. Occasional wrong-word or sound-a-like substitutions may have occurred due to the inherent limitations of voice recognition software

## 2022-10-25 ENCOUNTER — Ambulatory Visit: Payer: PPO | Admitting: Family Medicine

## 2022-10-31 ENCOUNTER — Other Ambulatory Visit: Payer: Self-pay | Admitting: Family Medicine

## 2022-12-08 ENCOUNTER — Other Ambulatory Visit: Payer: Self-pay | Admitting: Family Medicine

## 2022-12-27 ENCOUNTER — Telehealth: Payer: Self-pay | Admitting: *Deleted

## 2022-12-27 NOTE — Telephone Encounter (Signed)
Attempted call to patient to check status after a year post op. No answer and left VM requesting call back.

## 2023-01-02 ENCOUNTER — Telehealth: Payer: Self-pay | Admitting: *Deleted

## 2023-01-02 NOTE — Telephone Encounter (Signed)
Ortho bundle 1 year call completed. ?

## 2023-01-16 ENCOUNTER — Ambulatory Visit (INDEPENDENT_AMBULATORY_CARE_PROVIDER_SITE_OTHER): Payer: PPO | Admitting: Family Medicine

## 2023-01-16 ENCOUNTER — Encounter: Payer: Self-pay | Admitting: Family Medicine

## 2023-01-16 VITALS — BP 130/80 | HR 72 | Temp 98.3°F | Ht 62.0 in | Wt 184.2 lb

## 2023-01-16 DIAGNOSIS — I10 Essential (primary) hypertension: Secondary | ICD-10-CM | POA: Diagnosis not present

## 2023-01-16 DIAGNOSIS — E7849 Other hyperlipidemia: Secondary | ICD-10-CM | POA: Diagnosis not present

## 2023-01-16 LAB — LDL CHOLESTEROL, DIRECT: Direct LDL: 116 mg/dL

## 2023-01-16 LAB — COMPREHENSIVE METABOLIC PANEL
ALT: 18 U/L (ref 0–35)
AST: 22 U/L (ref 0–37)
Albumin: 4.6 g/dL (ref 3.5–5.2)
Alkaline Phosphatase: 73 U/L (ref 39–117)
BUN: 22 mg/dL (ref 6–23)
CO2: 25 mEq/L (ref 19–32)
Calcium: 10.5 mg/dL (ref 8.4–10.5)
Chloride: 102 mEq/L (ref 96–112)
Creatinine, Ser: 0.88 mg/dL (ref 0.40–1.20)
GFR: 65.23 mL/min (ref >60.00)
Glucose, Bld: 104 mg/dL — ABNORMAL HIGH (ref 70–99)
Potassium: 4.1 mEq/L (ref 3.5–5.1)
Sodium: 138 mEq/L (ref 135–145)
Total Bilirubin: 0.5 mg/dL (ref 0.2–1.2)
Total Protein: 7.4 g/dL (ref 6.0–8.3)

## 2023-01-16 LAB — LIPID PANEL
Cholesterol: 184 mg/dL (ref 0–200)
HDL: 42.1 mg/dL (ref >39.00)
NonHDL: 142.37
Total CHOL/HDL Ratio: 4
Triglycerides: 206 mg/dL — ABNORMAL HIGH (ref 0.0–149.0)
VLDL: 41.2 mg/dL — ABNORMAL HIGH (ref 0.0–40.0)

## 2023-01-16 NOTE — Progress Notes (Signed)
Subjective  CC:  Chief Complaint  Patient presents with   Hypertension   Hyperlipidemia    HPI: Kelly Barajas is a 72 y.o. female who presents to the office today to address the problems listed above in the chief complaint. Hyperlipidemia: Now on Livalo 4 mg nightly and feeling well.  No more myalgias.  Diet is fair.  Fasting for recheck lipids.  Has used Zetia in the distant past without adverse effects.  Intolerant to Lipitor, Crestor and Pravachol. Hypertension f/u: Control is good . Pt reports she is doing well. taking medications as instructed, no medication side effects noted, no TIAs, no chest pain on exertion, no dyspnea on exertion, no swelling of ankles. She denies adverse effects from his BP medications. Compliance with medication is good.   Assessment  1. Familial hyperlipidemia   2. Essential hypertension      Plan  Hyperlipidemia on Livalo 4 mg nightly.  Check fasting lipids today.  Goal LDL less than 100.  Add Zetia if needed.  Check LFTs. Hypertension f/u: BP control is well controlled.  No cardiovascular disease.  Continue lisinopril HCTZ.  Education regarding management of these chronic disease states was given. Management strategies discussed on successive visits include dietary and exercise recommendations, goals of achieving and maintaining IBW, and lifestyle modifications aiming for adequate sleep and minimizing stressors.   Follow up: March for complete physical  Orders Placed This Encounter  Procedures   Comprehensive metabolic panel   Lipid panel   No orders of the defined types were placed in this encounter.     BP Readings from Last 3 Encounters:  01/16/23 130/80  10/24/22 136/76  09/13/22 126/78   Wt Readings from Last 3 Encounters:  01/16/23 184 lb 3.2 oz (83.6 kg)  10/24/22 189 lb 9.6 oz (86 kg)  09/13/22 191 lb 3.2 oz (86.7 kg)    Lab Results  Component Value Date   CHOL 324 (H) 09/13/2022   CHOL 286 (H) 03/15/2022   CHOL 193  09/13/2021   Lab Results  Component Value Date   HDL 40.30 09/13/2022   HDL 47.10 03/15/2022   HDL 45.50 09/13/2021   Lab Results  Component Value Date   LDLCALC 110 (H) 09/13/2021   LDLCALC 114 (H) 09/01/2020   LDLCALC 110 (H) 08/14/2018   Lab Results  Component Value Date   TRIG 274.0 (H) 09/13/2022   TRIG 250.0 (H) 03/15/2022   TRIG 185.0 (H) 09/13/2021   Lab Results  Component Value Date   CHOLHDL 8 09/13/2022   CHOLHDL 6 03/15/2022   CHOLHDL 4 09/13/2021   Lab Results  Component Value Date   LDLDIRECT 207.0 09/13/2022   LDLDIRECT 196.0 03/15/2022   LDLDIRECT 120.0 08/21/2019   Lab Results  Component Value Date   CREATININE 0.94 09/13/2022   BUN 25 (H) 09/13/2022   NA 137 09/13/2022   K 4.4 09/13/2022   CL 101 09/13/2022   CO2 28 09/13/2022    The ASCVD Risk score (Arnett DK, et al., 2019) failed to calculate for the following reasons:   The valid total cholesterol range is 130 to 320 mg/dL  I reviewed the patients updated PMH, FH, and SocHx.    Patient Active Problem List   Diagnosis Date Noted   Hypothyroidism 06/01/2014    Priority: High   Essential hypertension 11/27/2013    Priority: High   Mixed hyperlipidemia 10/04/2012    Priority: High   Osteopenia 08/21/2019    Priority: Medium  Gastroesophageal reflux disease without esophagitis 11/27/2013    Priority: Medium    Aortic valve sclerosis systolic heart murmur 09/28/2022    Priority: Low   S/P total knee arthroplasty, left 10/28/2021    Priority: Low   Elevated LFTs 09/01/2020    Priority: Low   Bilateral primary osteoarthritis of knee 02/19/2019    Priority: Low   Obesity (BMI 30-39.9) 09/05/2016    Priority: Low   Overactive bladder 03/07/2016    Priority: Low   Vitamin D deficiency 03/07/2016    Priority: Low   Allergic rhinitis due to pollen 10/24/2022    Allergies: Nickel  Social History: Patient  reports that she has never smoked. She has never used smokeless tobacco.  She reports current alcohol use. She reports that she does not use drugs.  Current Meds  Medication Sig   Calcium Citrate-Vitamin D (CALCIUM + D PO) Take 1 tablet by mouth 2 (two) times daily.   Coenzyme Q10 (COQ10) 100 MG CAPS Take 100 mg by mouth daily.   lisinopril-hydrochlorothiazide (ZESTORETIC) 20-12.5 MG tablet Take 1 tablet by mouth once daily   magnesium gluconate (MAGONATE) 500 MG tablet Take 500 mg by mouth in the morning and at bedtime.   Naproxen Sodium 220 MG CAPS Take by mouth in the morning and at bedtime.   Omega-3 Fatty Acids (FISH OIL PO) Take 2,000 mg by mouth 2 (two) times daily.   omeprazole (PRILOSEC) 20 MG capsule Take 20 mg by mouth daily.   OVER THE COUNTER MEDICATION Inhale 1 puff into the lungs daily as needed (congestion). himalayan pink salt inhaler   Pitavastatin Calcium (LIVALO) 4 MG TABS Take 1 tablet (4 mg total) by mouth daily.   SYNTHROID 88 MCG tablet TAKE 1 TABLET BY MOUTH ONCE DAILY BEFORE BREAKFAST    Review of Systems: Cardiovascular: negative for chest pain, palpitations, leg swelling, orthopnea Respiratory: negative for SOB, wheezing or persistent cough Gastrointestinal: negative for abdominal pain Genitourinary: negative for dysuria or gross hematuria  Objective  Vitals: BP 130/80   Pulse 72   Temp 98.3 F (36.8 C)   Ht 5\' 2"  (1.575 m)   Wt 184 lb 3.2 oz (83.6 kg)   SpO2 97%   BMI 33.69 kg/m  General: no acute distress  Psych:  Alert and oriented, normal mood and affect  Commons side effects, risks, benefits, and alternatives for medications and treatment plan prescribed today were discussed, and the patient expressed understanding of the given instructions. Patient is instructed to call or message via MyChart if he/she has any questions or concerns regarding our treatment plan. No barriers to understanding were identified. We discussed Red Flag symptoms and signs in detail. Patient expressed understanding regarding what to do in case of  urgent or emergency type symptoms.  Medication list was reconciled, printed and provided to the patient in AVS. Patient instructions and summary information was reviewed with the patient as documented in the AVS. This note was prepared with assistance of Dragon voice recognition software. Occasional wrong-word or sound-a-like substitutions may have occurred due to the inherent limitation

## 2023-01-29 ENCOUNTER — Other Ambulatory Visit: Payer: Self-pay | Admitting: Family Medicine

## 2023-03-02 ENCOUNTER — Other Ambulatory Visit: Payer: Self-pay | Admitting: Family Medicine

## 2023-03-05 ENCOUNTER — Encounter: Payer: Self-pay | Admitting: Family Medicine

## 2023-03-07 ENCOUNTER — Ambulatory Visit (INDEPENDENT_AMBULATORY_CARE_PROVIDER_SITE_OTHER): Payer: PPO

## 2023-03-07 DIAGNOSIS — Z23 Encounter for immunization: Secondary | ICD-10-CM

## 2023-04-24 IMAGING — DX DG CHEST 2V
2 series · 2 of 2 positions shown · non-contrast
Comparison: 09/22/2011

CLINICAL DATA: 71-year-old female with a history of preoperative
chest x-ray

EXAM:
CHEST - 2 VIEW

[w chest pa]
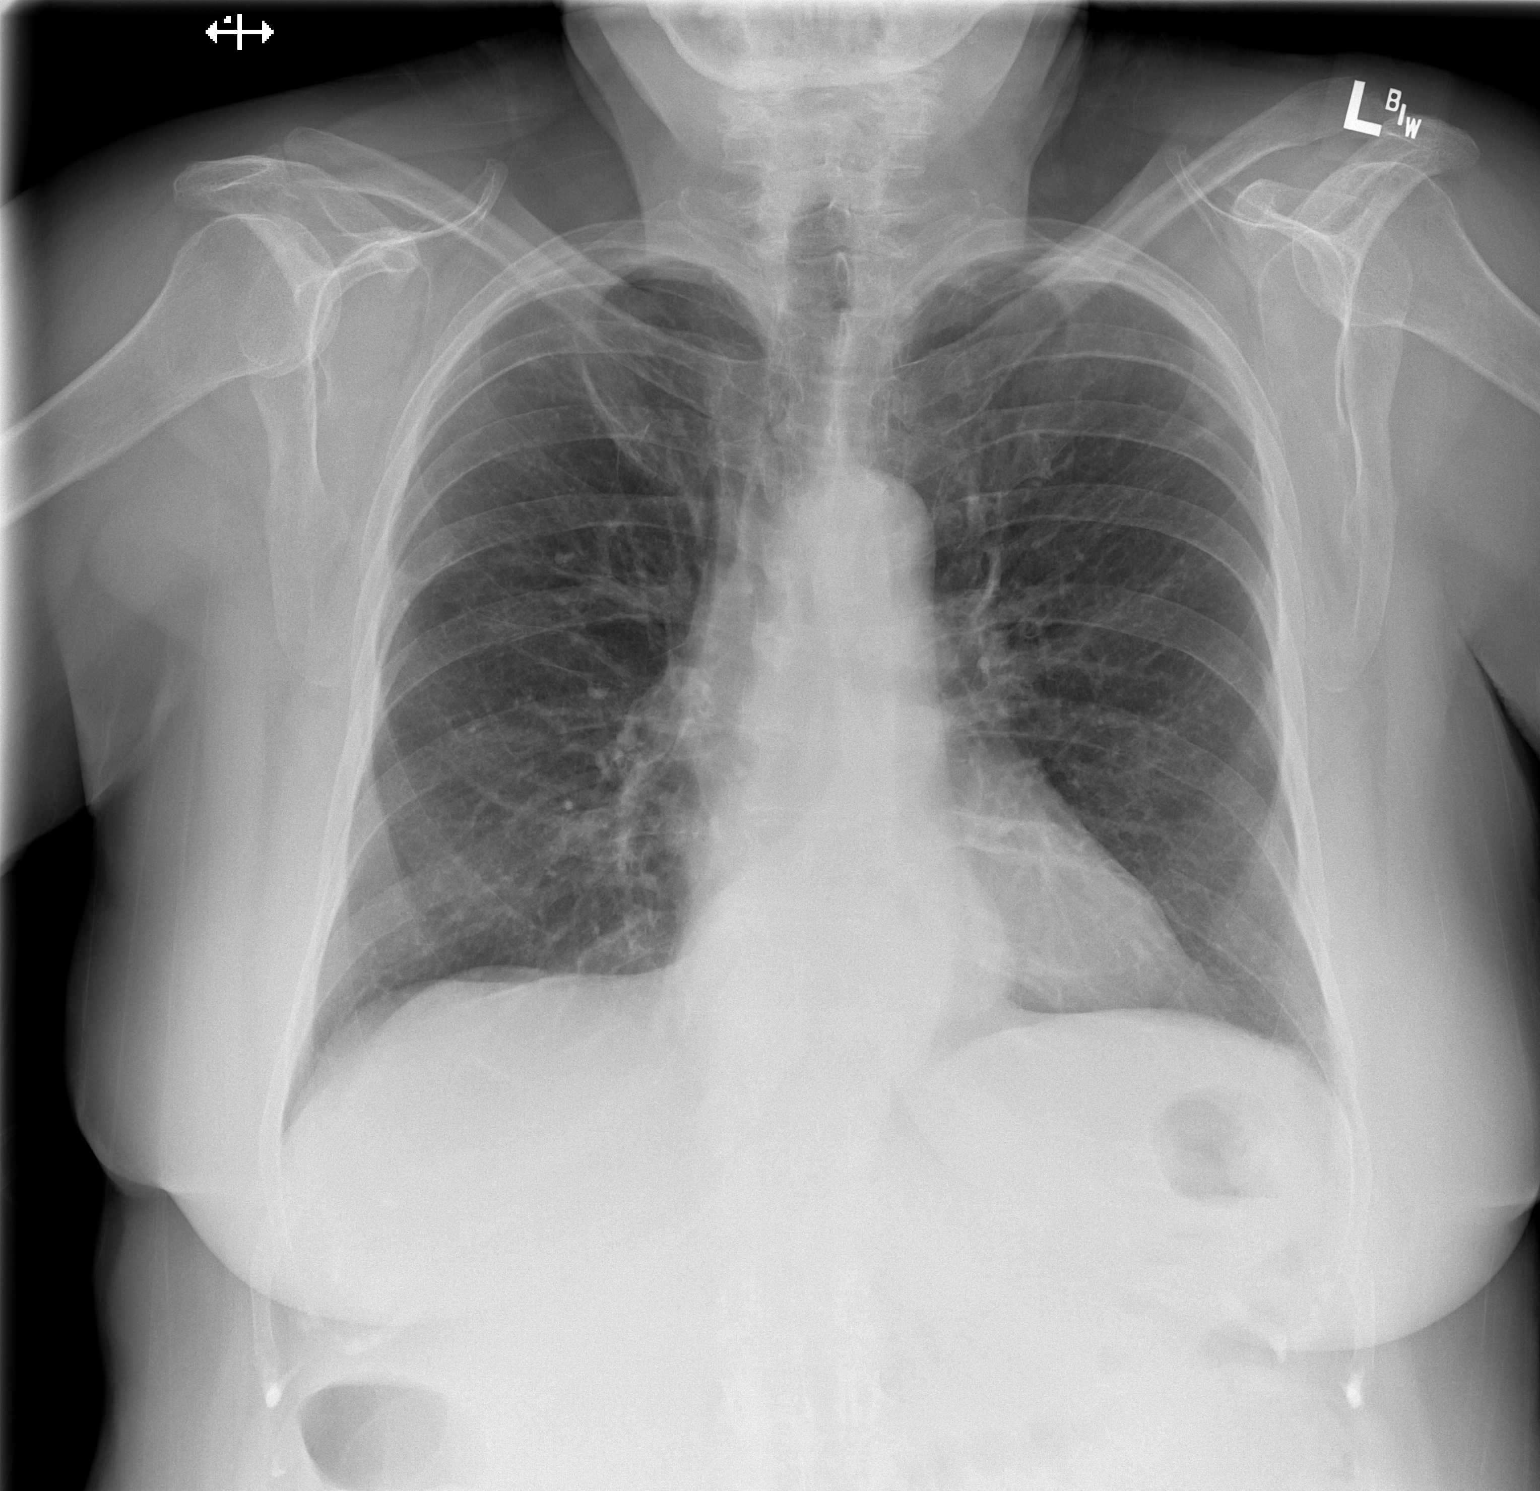

[w chest lat]
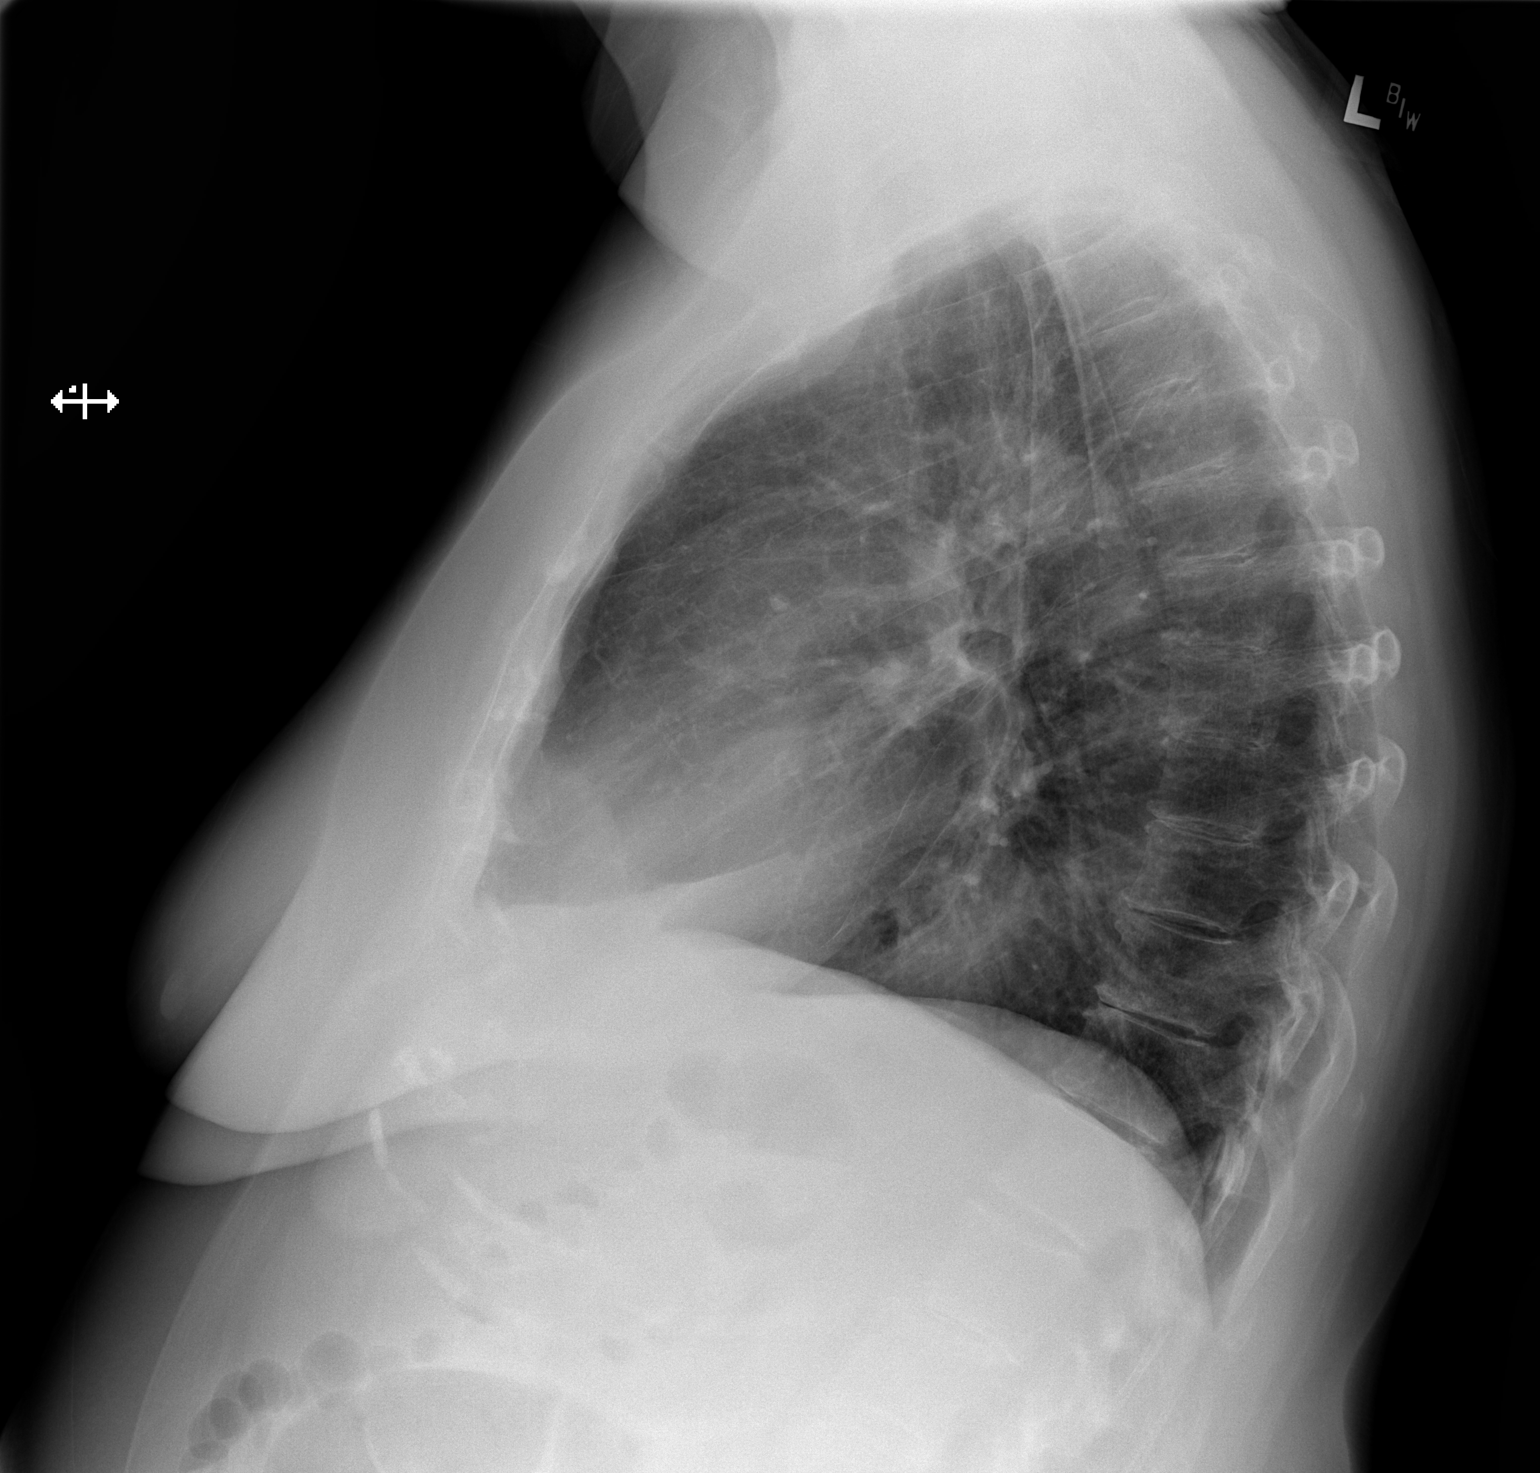

[2 of 2 positions shown; findings below may reference images not displayed]

FINDINGS: Cardiomediastinal silhouette unchanged in size and contour. No
evidence of central vascular congestion. No interlobular septal
thickening.

Double density projecting over the lower mediastinum, unchanged

No pneumothorax or pleural effusion. Coarsened interstitial
markings, with no confluent airspace disease.

No acute displaced fracture. Degenerative changes of the spine.
IMPRESSION: Negative for acute cardiopulmonary disease.

Hiatal hernia

## 2023-04-28 ENCOUNTER — Other Ambulatory Visit: Payer: Self-pay | Admitting: Family

## 2023-05-02 NOTE — Telephone Encounter (Signed)
Pt called and would like the RX below to be sent to pharmacy

## 2023-05-30 ENCOUNTER — Other Ambulatory Visit: Payer: Self-pay | Admitting: Family Medicine

## 2023-07-09 ENCOUNTER — Ambulatory Visit (INDEPENDENT_AMBULATORY_CARE_PROVIDER_SITE_OTHER): Payer: PPO

## 2023-07-09 VITALS — Ht 63.0 in | Wt 175.0 lb

## 2023-07-09 DIAGNOSIS — Z0000 Encounter for general adult medical examination without abnormal findings: Secondary | ICD-10-CM

## 2023-07-09 NOTE — Patient Instructions (Signed)
[name] , Thank you for taking time to come for your Medicare Wellness Visit. I appreciate your ongoing commitment to your health goals. Please review the following plan we discussed and let me know if I can assist you in the future.   Referrals/Orders/Follow-Ups/Clinician Recommendations: patient stated to be more active exercising Aim for 30 minutes of exercise or brisk walking, 6-8 glasses of water, and 5 servings of fruits and vegetables each day.  This is a list of the screening recommended for you and due dates:  Health Maintenance  Topic Date Due   DTaP/Tdap/Td vaccine (2 - Td or Tdap) 10/17/2021   COVID-19 Vaccine (3 - 2024-25 season) 02/11/2023   Medicare Annual Wellness Visit  06/30/2023   Mammogram  08/21/2023   DEXA scan (bone density measurement)  03/03/2024   Colon Cancer Screening  03/26/2025   Pneumonia Vaccine  Completed   Flu Shot  Completed   Hepatitis C Screening  Completed   Zoster (Shingles) Vaccine  Completed   HPV Vaccine  Aged Out    Advanced directives: (Declined) Advance directive discussed with you today. Even though you declined this today, please call our office should you change your mind, and we can give you the proper paperwork for you to fill out.  Next Medicare Annual Wellness Visit scheduled for next year: Yes

## 2023-07-09 NOTE — Progress Notes (Signed)
Subjective:   Kelly Barajas is a 73 y.o. female who presents for Medicare Annual (Subsequent) preventive examination.  Visit Complete: Virtual I connected with  BRILYNN BIASI on 07/09/23 by a audio enabled telemedicine application and verified that I am speaking with the correct person using two identifiers.  Patient Location: Home  Provider Location: Office/Clinic  I discussed the limitations of evaluation and management by telemedicine. The patient expressed understanding and agreed to proceed.  Vital Signs: Because this visit was a virtual/telehealth visit, some criteria may be missing or patient reported. Any vitals not documented were not able to be obtained and vitals that have been documented are patient reported.  Cardiac Risk Factors include: hypertension;dyslipidemia;advanced age (>39men, >19 women);obesity (BMI >30kg/m2)     Objective:    Today's Vitals   07/09/23 1042  Weight: 175 lb (79.4 kg)  Height: 5\' 3"  (1.6 m)   Body mass index is 31 kg/m.     07/09/2023   11:05 AM 06/29/2022   11:08 AM 10/03/2021    3:06 PM 09/14/2021    8:15 PM 09/12/2021    3:07 PM 06/20/2021   11:37 AM 03/24/2019   11:30 AM  Advanced Directives  Does Patient Have a Medical Advance Directive? No No No No No No No  Would patient like information on creating a medical advance directive? No - Patient declined No - Patient declined  No - Patient declined No - Patient declined Yes (MAU/Ambulatory/Procedural Areas - Information given) Yes (MAU/Ambulatory/Procedural Areas - Information given)    Current Medications (verified) Outpatient Encounter Medications as of 07/09/2023  Medication Sig   Calcium Citrate-Vitamin D (CALCIUM + D PO) Take 1 tablet by mouth 2 (two) times daily.   Coenzyme Q10 (COQ10) 100 MG CAPS Take 100 mg by mouth daily.   lisinopril-hydrochlorothiazide (ZESTORETIC) 20-12.5 MG tablet Take 1 tablet by mouth once daily   magnesium gluconate (MAGONATE) 500 MG tablet Take  500 mg by mouth in the morning and at bedtime.   Naproxen Sodium 220 MG CAPS Take by mouth in the morning and at bedtime.   Omega-3 Fatty Acids (FISH OIL PO) Take 2,000 mg by mouth 2 (two) times daily.   omeprazole (PRILOSEC) 20 MG capsule Take 20 mg by mouth daily.   OVER THE COUNTER MEDICATION Inhale 1 puff into the lungs daily as needed (congestion). himalayan pink salt inhaler   Pitavastatin Calcium (LIVALO) 4 MG TABS Take 1 tablet (4 mg total) by mouth daily.   SYNTHROID 88 MCG tablet TAKE 1 TABLET BY MOUTH ONCE DAILY BEFORE BREAKFAST   No facility-administered encounter medications on file as of 07/09/2023.    Allergies (verified) Nickel   History: Past Medical History:  Diagnosis Date   Allergy    Nickel   Arthritis    Essential hypertension 11/27/2013   GERD (gastroesophageal reflux disease)    Hepatitis    Diagnosed 50 years ago, unsure of which type, but she did have jaundice.   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   Procedure:  LEFT TOTAL KNEE ARTHROPLASTY;  Surgeon: Eldred Manges, MD;  Location: [facility] OR;  Service: Orthopedics;  Laterality: Left;   Family History  Problem Relation Age of Onset   Hyperlipidemia Mother    Diabetes Mellitus II Mother    CAD Mother    Hypertension Mother    Depression Mother    Diabetes Mother    Arthritis Mother    CAD Father    Hyperlipidemia Father    Heart attack Father    Hypertension Sister    Hypertension Brother    Hypothyroidism Daughter    Colon cancer Maternal Grandmother 87   Hypothyroidism  Daughter    Social History   Socioeconomic History   Marital status: Divorced    Spouse name: Not on file   Number of children: 2   Years of education: Not on file   Highest education level: 12th grade  Occupational History   Occupation: retired Engineer, agricultural  Tobacco Use   Smoking status: Never   Smokeless tobacco: Never  Vaping Use   Vaping status: Never Used  Substance and Sexual Activity   Alcohol use: Yes    Comment: socially   Drug use: No   Sexual activity: Never    Birth control/protection: Post-menopausal  Other Topics Concern   Not on file  Social History Narrative   Not on file   Social Drivers of Health   Financial Resource Strain: Low Risk  (07/09/2023)   Overall Financial Resource Strain (CARDIA)    Difficulty of Paying Living Expenses: Not hard at all  Food Insecurity: No Food Insecurity (07/09/2023)   Hunger Vital Sign    Worried About Running Out of Food in the Last Year: Never true    Ran Out of Food in the Last Year: Never true  Transportation Needs: No Transportation Needs (07/09/2023)   PRAPARE - Administrator, Civil Service (Medical): No    Lack of Transportation (Non-Medical): No  Physical Activity: Sufficiently Active (07/09/2023)   Exercise Vital Sign    Days of Exercise per Week: 7 days    Minutes of Exercise per Session: 30 min  Stress: No Stress Concern Present (07/09/2023)   [employer] of Occupational Health - Occupational Stress Questionnaire    Feeling of Stress : Not at all  Social Connections: Moderately Integrated (07/09/2023)   Social Connection and Isolation Panel [NHANES]    Frequency of Communication with Friends and Family: More than three times a week    Frequency of Social Gatherings with Friends and Family: More than three times a week    Attends Religious Services: More than 4 times per year    Active Member of [employer] or Organizations: Yes    Attends Banker Meetings: 1 to 4  times per year    Marital Status: Divorced    Tobacco Counseling Counseling given: Not Answered   Clinical Intake:  Pre-visit preparation completed: Yes  Pain : No/denies pain     BMI - recorded: 31 Nutritional Status: BMI > 30  Obese Diabetes: No  How often do you need to have someone help you when you read instructions, pamphlets, or other written materials from your doctor or pharmacy?: 1 - Never  Interpreter Needed?: No  Information entered by :: Hassell Halim CMA   Activities of Daily Living    07/09/2023   10:45 AM  In your present state of health, do you have any difficulty performing the following activities:  Hearing? 0  Vision? 1  Comment per patient uses OTC eyeglasses for reading  Difficulty concentrating or making decisions? 0  Walking or climbing stairs? 0  Dressing or bathing? 0  Doing errands, shopping? 0  Preparing Food and eating ? N  Using the Toilet? N  In the past six months, have you accidently leaked urine? N  Do you have problems with loss of bowel control? N  Managing your Medications? N  Managing your Finances? N  Housekeeping or managing your Housekeeping? N    Patient Care Team: Willow Ora, MD as PCP - General (Family Medicine) Eldred Manges, MD as Consulting Physician (Orthopedic Surgery) Obgyn, Desma Paganini, Illene Bolus, MD as Consulting Physician (Cardiology) Derryl Harbor, OD as Consulting Physician (Optometry) [place], Rita Ohara, [facility] (Inactive) (Pharmacist)  Indicate any recent Medical Services you may have received from other than [facility] providers in the past year (date may be approximate).     Assessment:   This is a routine wellness examination for [place].  Hearing/Vision screen Hearing Screening - Comments:: Per pt no concerns with hearing Vision Screening - Comments:: Per pt no concerns with vision. Uses OTC reading glasses.     Goals Addressed               This Visit's Progress     Patient Stated  (pt-stated)        Patient wants to be more active       Depression Screen    07/09/2023   10:51 AM 01/16/2023    8:18 AM 10/24/2022    9:05 AM 09/13/2022    9:34 AM 06/29/2022   11:07 AM 03/15/2022    9:06 AM 09/13/2021    8:39 AM  PHQ 2/9 Scores  PHQ - 2 Score 0 0 0 3 0 0 0  PHQ- 9 Score   0 12       Fall Risk    07/09/2023   10:56 AM 01/16/2023    8:23 AM 01/16/2023    8:18 AM 10/24/2022    9:05 AM 09/13/2022    9:34 AM  Fall Risk   Falls in the past year? 0 1 0 0 0  Number falls in past yr: 0 0 0 0 0  Injury with Fall? 0 0 0 0 0  Risk for fall due to : No Fall Risks No Fall Risks No Fall Risks No Fall Risks No Fall Risks  Follow up Falls prevention discussed Falls evaluation completed Falls evaluation completed Falls evaluation completed Falls evaluation completed    MEDICARE RISK AT HOME: Medicare Risk at Home Any stairs in or around the home?: Yes If so, are there any without handrails?: No Home free of loose throw rugs in walkways, pet beds, electrical cords, etc?: Yes Adequate lighting in your home to reduce risk of falls?: Yes Life alert?: No Use of a cane, walker or w/c?: No Grab bars in the bathroom?: No Shower chair or bench in shower?: No (patient has a portable chair if need to use) Elevated toilet seat or a handicapped toilet?: Yes  TIMED UP AND GO:  Was the test performed?  No    Cognitive Function:    03/24/2019   11:31 AM 02/21/2018    3:23 PM  MMSE - Mini Mental State Exam  Orientation to time 5 5  Orientation to Place 5 5  Registration 3 3  Attention/ Calculation 5 5  Recall 3 3  Language- name 2 objects 2 2  Language- repeat  1 1  Language- follow 3 step command 3 3  Language- read & follow direction 1 1  Write a sentence 1 1  Copy design 1 1  Total score 30 30        07/09/2023   10:57 AM 06/29/2022   11:10 AM 06/20/2021   11:40 AM  6CIT Screen  What Year? 0 points 0 points 0 points  What month? 0 points 0 points 0 points  What time? 0  points 0 points 0 points  Count back from 20 0 points 0 points 0 points  Months in reverse 0 points 0 points 0 points  Repeat phrase 0 points 0 points 0 points  Total Score 0 points 0 points 0 points    Immunizations Immunization History  Administered Date(s) Administered   Fluad Quad(high Dose 65+) 02/19/2019, 03/03/2020, 03/08/2021, 03/15/2022   Fluad Trivalent(High Dose 65+) 03/07/2023   Influenza, High Dose Seasonal PF 02/12/2018   Janssen (J&J) SARS-COV-2 Vaccination 10/22/2019, 11/11/2019   Pneumococcal Conjugate-13 09/05/2016   Pneumococcal Polysaccharide-23 08/17/2015, 10/23/2017   Tdap 10/18/2011   Zoster Recombinant(Shingrix) 02/28/2018, 08/28/2018    TDAP status: Due, Education has been provided regarding the importance of this vaccine. Advised may receive this vaccine at local pharmacy or Health Dept. Aware to provide a copy of the vaccination record if obtained from local pharmacy or Health Dept. Verbalized acceptance and understanding.  Flu Vaccine status: Up to date  Pneumococcal vaccine status: Up to date  Covid-19 vaccine status: Information provided on how to obtain vaccines.   Qualifies for Shingles Vaccine? Yes   Zostavax completed Yes   Shingrix Completed?: Yes  Screening Tests Health Maintenance  Topic Date Due   DTaP/Tdap/Td (2 - Td or Tdap) 10/17/2021   COVID-19 Vaccine (3 - 2024-25 season) 02/11/2023   MAMMOGRAM  08/21/2023   DEXA SCAN  03/03/2024   Medicare Annual Wellness (AWV)  07/08/2024   Colonoscopy  03/26/2025   Pneumonia Vaccine 32+ Years old  Completed   INFLUENZA VACCINE  Completed   Hepatitis C Screening  Completed   Zoster Vaccines- Shingrix  Completed   HPV VACCINES  Aged Out    Health Maintenance  Health Maintenance Due  Topic Date Due   DTaP/Tdap/Td (2 - Td or Tdap) 10/17/2021   COVID-19 Vaccine (3 - 2024-25 season) 02/11/2023    Colorectal cancer screening: Type of screening: Colonoscopy. Completed 03/27/15. Repeat every  10 years  Mammogram status: Completed 08/21/22. Repeat every year  Bone Density status: Completed 03/03/21. Results reflect: Bone density results: OSTEOPENIA. Repeat every 3 years.  Additional Screening:  Hepatitis C Screening: Completed 08/15/17  Vision Screening: Recommended annual ophthalmology exams for early detection of glaucoma and other disorders of the eye. Is the patient up to date with their annual eye exam?  No  Who is the provider or what is the name of the office in which the patient attends annual eye exams? Encouraged to follow up with provider  If pt is not established with a provider, would they like to be referred to a provider to establish care? No .   Dental Screening: Recommended annual dental exams for proper oral hygiene   Community Resource Referral / Chronic Care Management: CRR required this visit?  No   CCM required this visit?  No     Plan:     I have personally reviewed and noted the following in the patient's chart:   Medical and social history Use of alcohol, tobacco or illicit drugs  Current  medications and supplements including opioid prescriptions. Patient is not currently taking opioid prescriptions. Functional ability and status Nutritional status Physical activity Advanced directives List of other physicians Hospitalizations, surgeries, and ER visits in previous 12 months Vitals Screenings to include cognitive, depression, and falls Referrals and appointments  In addition, I have reviewed and discussed with patient certain preventive protocols, quality metrics, and best practice recommendations. A written personalized care plan for preventive services as well as general preventive health recommendations were provided to patient.     Marzella Schlein, LPN   [id number]   After Visit Summary: (MyChart) Due to this being a telephonic visit, the after visit summary with patients personalized plan was offered to patient via MyChart   Nurse  Notes: none

## 2023-07-29 ENCOUNTER — Other Ambulatory Visit: Payer: Self-pay | Admitting: Family Medicine

## 2023-08-06 ENCOUNTER — Other Ambulatory Visit: Payer: Self-pay | Admitting: Family Medicine

## 2023-08-06 DIAGNOSIS — Z1231 Encounter for screening mammogram for malignant neoplasm of breast: Secondary | ICD-10-CM

## 2023-08-23 ENCOUNTER — Ambulatory Visit
Admission: RE | Admit: 2023-08-23 | Discharge: 2023-08-23 | Disposition: A | Discharge status: Home / Self Care | Payer: PPO | Source: Ambulatory Visit | Attending: Family Medicine | Admitting: Family Medicine

## 2023-08-23 DIAGNOSIS — Z1231 Encounter for screening mammogram for malignant neoplasm of breast: Secondary | ICD-10-CM

## 2023-08-25 ENCOUNTER — Other Ambulatory Visit: Payer: Self-pay | Admitting: Family Medicine

## 2023-09-18 ENCOUNTER — Ambulatory Visit: Payer: PPO | Admitting: Family Medicine

## 2023-09-18 ENCOUNTER — Encounter: Payer: Self-pay | Admitting: Family Medicine

## 2023-09-18 VITALS — BP 136/79 | HR 83 | Temp 98.4°F | Ht 63.0 in | Wt 174.0 lb

## 2023-09-18 DIAGNOSIS — E559 Vitamin D deficiency, unspecified: Secondary | ICD-10-CM

## 2023-09-18 DIAGNOSIS — E7849 Other hyperlipidemia: Secondary | ICD-10-CM

## 2023-09-18 DIAGNOSIS — F4321 Adjustment disorder with depressed mood: Secondary | ICD-10-CM | POA: Diagnosis not present

## 2023-09-18 DIAGNOSIS — I10 Essential (primary) hypertension: Secondary | ICD-10-CM

## 2023-09-18 DIAGNOSIS — M170 Bilateral primary osteoarthritis of knee: Secondary | ICD-10-CM | POA: Diagnosis not present

## 2023-09-18 DIAGNOSIS — M8580 Other specified disorders of bone density and structure, unspecified site: Secondary | ICD-10-CM

## 2023-09-18 DIAGNOSIS — N3281 Overactive bladder: Secondary | ICD-10-CM

## 2023-09-18 DIAGNOSIS — Z0001 Encounter for general adult medical examination with abnormal findings: Secondary | ICD-10-CM | POA: Diagnosis not present

## 2023-09-18 DIAGNOSIS — E039 Hypothyroidism, unspecified: Secondary | ICD-10-CM

## 2023-09-18 DIAGNOSIS — Z780 Asymptomatic menopausal state: Secondary | ICD-10-CM | POA: Diagnosis not present

## 2023-09-18 LAB — CBC WITH DIFFERENTIAL/PLATELET
Basophils Absolute: 0.1 10*3/uL (ref 0.0–0.1)
Basophils Relative: 1.8 % (ref 0.0–3.0)
Eosinophils Absolute: 0.2 10*3/uL (ref 0.0–0.7)
Eosinophils Relative: 4.7 % (ref 0.0–5.0)
HCT: 40.8 % (ref 36.0–46.0)
Hemoglobin: 13.4 g/dL (ref 12.0–15.0)
Lymphocytes Relative: 20.7 % (ref 12.0–46.0)
Lymphs Abs: 1 10*3/uL (ref 0.7–4.0)
MCHC: 32.8 g/dL (ref 30.0–36.0)
MCV: 86.6 fl (ref 78.0–100.0)
Monocytes Absolute: 0.5 10*3/uL (ref 0.1–1.0)
Monocytes Relative: 10.6 % (ref 3.0–12.0)
Neutro Abs: 3.1 10*3/uL (ref 1.4–7.7)
Neutrophils Relative %: 62.2 % (ref 43.0–77.0)
Platelets: 292 10*3/uL (ref 150.0–400.0)
RBC: 4.71 Mil/uL (ref 3.87–5.11)
RDW: 16.5 % — ABNORMAL HIGH (ref 11.5–15.5)
WBC: 4.9 10*3/uL (ref 4.0–10.5)

## 2023-09-18 LAB — LIPID PANEL
Cholesterol: 197 mg/dL (ref 0–200)
HDL: 45.6 mg/dL (ref >39.00)
LDL Cholesterol: 109 mg/dL — ABNORMAL HIGH (ref 0–99)
NonHDL: 151.6
Total CHOL/HDL Ratio: 4
Triglycerides: 213 mg/dL — ABNORMAL HIGH (ref 0.0–149.0)
VLDL: 42.6 mg/dL — ABNORMAL HIGH (ref 0.0–40.0)

## 2023-09-18 LAB — COMPREHENSIVE METABOLIC PANEL WITH GFR
ALT: 10 U/L (ref 0–35)
AST: 15 U/L (ref 0–37)
Albumin: 4.6 g/dL (ref 3.5–5.2)
Alkaline Phosphatase: 77 U/L (ref 39–117)
BUN: 21 mg/dL (ref 6–23)
CO2: 27 meq/L (ref 19–32)
Calcium: 10 mg/dL (ref 8.4–10.5)
Chloride: 104 meq/L (ref 96–112)
Creatinine, Ser: 0.84 mg/dL (ref 0.40–1.20)
GFR: 68.65 mL/min (ref >60.00)
Glucose, Bld: 97 mg/dL (ref 70–99)
Potassium: 4.5 meq/L (ref 3.5–5.1)
Sodium: 139 meq/L (ref 135–145)
Total Bilirubin: 0.5 mg/dL (ref 0.2–1.2)
Total Protein: 6.8 g/dL (ref 6.0–8.3)

## 2023-09-18 LAB — TSH: TSH: 0.49 u[IU]/mL (ref 0.35–5.50)

## 2023-09-18 LAB — VITAMIN D 25 HYDROXY (VIT D DEFICIENCY, FRACTURES): VITD: 44.81 ng/mL (ref 30.00–100.00)

## 2023-09-18 NOTE — Patient Instructions (Signed)
 Please return in 6 months for hypertension follow up.   I will release your lab results to you on your MyChart account with further instructions. You may see the results before I do, but when I review them I will send you a message with my report or have my assistant call you if things need to be discussed. Please reply to my message with any questions. Thank you!   If you have any questions or concerns, please don't hesitate to send me a message via MyChart or call the office at [phone]. Thank you for visiting with [place] today! It's our pleasure caring for you.   VISIT SUMMARY:  During today's visit, we discussed the management of your hypertension, arthritis, and sciatic nerve pain. We also reviewed your current medications and explored strategies to manage their costs. Additionally, we talked about your recent emotional stress and its impact on your health. We emphasized the importance of regular exercise and a healthy diet in maintaining your overall well-being.  YOUR PLAN:  -SCIATICA: Sciatica is pain that radiates along the path of the sciatic nerve, which runs from your lower back through your hips and down each leg. You reported improvement in your sciatic nerve discomfort. Continue using a heating pad as needed for relief.  -HYPERTENSION: Hypertension, or high blood pressure, can lead to serious health issues if not managed properly. Your blood pressure was elevated today, likely due to stress. Our goal is to maintain your blood pressure in the 120s/70s range. Recheck your blood pressure after resting, and consider increasing the diuretic component of your current medication if it remains elevated. Monitor your blood pressure at home and report any high readings.  -OSTEOARTHRITIS: Osteoarthritis is a condition that causes joint pain and stiffness. We discussed concerns about the long-term use of naproxen affecting your kidney function. Discontinue daily naproxen use and start taking  glucosamine and turmeric supplements. Use acetaminophen as needed for pain management.  -HYPERLIPIDEMIA: Hyperlipidemia is the presence of high levels of fats (lipids) in the blood, which can increase the risk of heart disease. Your current cholesterol medication is effective but costly. Continue taking your current medication and explore cost management strategies.  -GENERAL HEALTH MAINTENANCE: Maintaining good health involves regular exercise and a balanced diet. Continue your regular exercise regimen and incorporate dietary changes such as reducing processed carbohydrates and trying healthier alternatives like rolled oats and sweet potatoes. A bone density test has been ordered, (please call the breast center to schedule it), and we will review the results when available.  INSTRUCTIONS:  Please monitor your blood pressure at home and report any high readings. Recheck your blood pressure after resting. Discontinue daily naproxen use and start taking glucosamine and turmeric supplements. Use acetaminophen as needed for pain management. Continue taking your current cholesterol medication and explore cost management strategies. Continue your regular exercise regimen and incorporate dietary changes. A bone density test has been ordered; we will review the results when they are available.

## 2023-09-18 NOTE — Progress Notes (Signed)
 Subjective  Chief Complaint  Patient presents with   Annual Exam    Pt here for Annual Exam and is currently fasting    Hypertension   Hyperlipidemia    HPI: Kelly Barajas is a 73 y.o. female who presents to [facility] [place] today for a Female Wellness Visit. She also has the concerns and/or needs as listed above in the chief complaint. These will be addressed in addition to the Health Maintenance Visit.   Wellness Visit: annual visit with health maintenance review and exam  HM: mammo current. CRC screen current. Due dexa. Imms current. On a health and spiritual journey with health diet, prayer and exercise. Feeling physically well.  Chronic disease f/u and/or acute problem visit: (deemed necessary to be done in addition to the wellness visit):Discussed the use of AI scribe software for clinical note transcription with the patient, who gave verbal consent to proceed.  History of Present Illness   Kelly Barajas is a 73 year old female who presents for follow-up of hypertension and arthritis management. She has experienced significant grief recently due to the loss of her 48 year old nephew, Minerva Areola, four weeks ago. She describes symptoms he had before his passing, including sweating profusely and a purple discoloration of his chest. She believes he may have had a heart attack. This loss has impacted her emotionally, and she is supporting her sister, Eric's mother, through this difficult time. She presents for follow-up of hypertension management. Her blood pressure was elevated this morning, which she attributes to a stressful driving incident and the recent death of her nephew. She has not checked her blood pressure elsewhere recently. She uses a Fitbit to monitor her health and feels she has been doing well overall.  She is currently taking medication for her cholesterol, which she finds expensive. She is on a generic medication with a copay of $220 for a 90-day  supply and is considering adjusting her purchase frequency to manage costs better. Livalo working well and intolerant to other statins. LDL > 200 in past; better controlled on livalo.   She reports experiencing sciatic nerve pain since Thursday, with a 'catch' in her lower back. She has been using a heating pad daily until today, which has helped her feel better. She is also managing arthritis in her joints, particularly in her fingers, and has been taking naproxen four times a day for this condition. No chest pain, breathing well, and her bladder is stable. She reports stiffness in her knees but does not currently take glucosamine or turmeric for joint health.  Her social history includes regular exercise, attending two classes a week, and riding a bicycle. She is focused on maintaining her health to outlive her 72 year old aunt. She has a supportive family network, including a daughter who lives four hours away and a sister who lives 15 miles away. She is actively involved in her aunt's care, who is in assisted living and has a helper for additional support.     Low thyroid feels well on meds.  Osteopenia and due for recheck.    Assessment  1. Encounter for well adult exam with abnormal findings   2. Essential hypertension   3. Acquired hypothyroidism   4. Familial hyperlipidemia   5. Osteopenia, unspecified location   6. Vitamin D deficiency   7. Overactive bladder   8. Asymptomatic menopausal state   9. Grief   10. Bilateral primary osteoarthritis of knee      Plan  Female Wellness Visit: Age appropriate Health Maintenance and Prevention measures were discussed with patient. Included topics are cancer screening recommendations, ways to keep healthy (see AVS) including dietary and exercise recommendations, regular eye and dental care, use of seat belts, and avoidance of moderate alcohol use and tobacco use.  BMI: discussed patient's BMI and encouraged positive lifestyle modifications to  help get to or maintain a target BMI. HM needs and immunizations were addressed and ordered. See below for orders. See HM and immunization section for updates. Routine labs and screening tests ordered including cmp, cbc and lipids where appropriate. Discussed recommendations regarding Vit D and calcium supplementation (see AVS)  Chronic disease management visit and/or acute problem visit: Assessment and Plan    Sciatica Improvement in sciatic nerve discomfort. - Use heating pad as needed for relief.  Hypertension Blood pressure elevated, likely due to stress and anxiety. Goal is to maintain blood pressure in the 120s/70s range. Discussed potential medication adjustments. - Recheck blood pressure at home. Notify me if remains above goal of 120s/70s. - Consider increasing diuretic component of current medication if blood pressure remains elevated. - Monitor blood pressure at home and report high readings. - check lytes and renal function  Osteoarthritis Joint stiffness with concerns about long-term naproxen use affecting kidney function. Discussed alternatives for pain management. - Discontinue daily naproxen use. - Start glucosamine supplement. - Start turmeric supplement. - Use acetaminophen as needed for pain management.  Osteopenia: for dexa, - monitor vit D  Hyperlipidemia Cholesterol medication effective but costly. Discussed cost management strategies. - Continue current cholesterol medication. Livalo 4 - Discuss cost management strategies for medication. - recheck lipids and lfts  Recheck thyroid levels General Health Maintenance Engaging in regular exercise and healthy eating habits. Discussed dietary modifications to improve liver health. - Continue regular exercise regimen. - Incorporate dietary changes such as reducing processed carbohydrates and trying healthier alternatives like rolled oats and sweet potatoes. - Order bone density test. - Review bone density  results when available.       Follow up: 6 mo for bp recheck  Orders Placed This Encounter  Procedures   DG Bone Density   VITAMIN D 25 Hydroxy (Vit-D Deficiency, Fractures)   CBC with Differential/Platelet   Comprehensive metabolic panel with GFR   Lipid panel   TSH   No orders of the defined types were placed in this encounter.     Body mass index is 30.82 kg/m. Wt Readings from Last 3 Encounters:  09/18/23 174 lb (78.9 kg)  07/09/23 175 lb (79.4 kg)  01/16/23 184 lb 3.2 oz (83.6 kg)     Patient Active Problem List   Diagnosis Date Noted Date Diagnosed   Hypothyroidism 06/01/2014     Priority: High   Essential hypertension 11/27/2013     Priority: High    Negative Stress Test 2017: false positive EKG changes.    Mixed hyperlipidemia 10/04/2012     Priority: High    Intolerant to crestor and lipitor: myalgias and mm cramps; tolerating livalo, had to chang to pravastatin 2023 due to cost but severe myalgias returned.  Recovered after stopping pravastatin for a month. Rechallenge with Livalo May 2024.  May need to add Zetia.  LDL was greater than 200.    Osteopenia 08/21/2019     Priority: Medium     Dexa 02/2021 lowest T =-1.8, on caltrate plus bid and exercise    Gastroesophageal reflux disease without esophagitis 11/27/2013     Priority: Medium  Aortic valve sclerosis systolic heart murmur 09/28/2022     Priority: Low    Echocardiogram 09/2022: nl EF, mild diastolic dysfunction, Aortic sclerosis without stenosis, trivial MR    S/P total knee arthroplasty, left 10/28/2021     Priority: Low   Elevated LFTs 09/01/2020     Priority: Low    Improved over time; neg hep b and c. Likely fatty liver. Never imaged. asypmtomatic    Bilateral primary osteoarthritis of knee 02/19/2019     Priority: Low   Obesity (BMI 30-39.9) 09/05/2016     Priority: Low   Overactive bladder 03/07/2016     Priority: Low   Vitamin D deficiency 03/07/2016     Priority: Low    Allergic rhinitis due to pollen 10/24/2022    Health Maintenance  Topic Date Due   DTaP/Tdap/Td (2 - Td or Tdap) 10/17/2021   COVID-19 Vaccine (3 - 2024-25 season) 10/04/2023 (Originally 02/11/2023)   INFLUENZA VACCINE  01/11/2024   DEXA SCAN  03/03/2024   Medicare Annual Wellness (AWV)  07/08/2024   MAMMOGRAM  08/22/2024   Colonoscopy  03/26/2025   Pneumonia Vaccine 51+ Years old  Completed   Hepatitis C Screening  Completed   Zoster Vaccines- Shingrix  Completed   HPV VACCINES  Aged Out   Immunization History  Administered Date(s) Administered   Fluad Quad(high Dose 65+) 02/19/2019, 03/03/2020, 03/08/2021, 03/15/2022   Fluad Trivalent(High Dose 65+) 03/07/2023   Influenza, High Dose Seasonal PF 02/12/2018   Janssen (J&J) SARS-COV-2 Vaccination 10/22/2019, 11/11/2019   Pneumococcal Conjugate-13 09/05/2016   Pneumococcal Polysaccharide-23 08/17/2015, 10/23/2017   Tdap 10/18/2011   Zoster Recombinant(Shingrix) 02/28/2018, 08/28/2018   We updated and reviewed the patient's past history in detail and it is documented below. Allergies: Patient is allergic to nickel. Past Medical History Patient  has a past medical history of Allergy, Arthritis, Essential hypertension (11/27/2013), GERD (gastroesophageal reflux disease), Hepatitis, Hyperlipidemia, Hypothyroidism, and Osteopenia (08/21/2019). Past Surgical History Patient  has a past surgical history that includes Tonsillectomy; Dilation and curettage, diagnostic / therapeutic; Knee arthroscopy w/ meniscal repair (Right, 07/16/2017); Dilation and curettage of uterus; Total knee arthroplasty (Left, 09/14/2021); and Injection knee (Right, 09/14/2021). Family History: Patient family history includes Arthritis in her mother; CAD in her father and mother; Colon cancer (age of onset: 63) in her maternal grandmother; Depression in her mother; Diabetes in her mother; Diabetes Mellitus II in her mother; Heart attack in her father; Hyperlipidemia in her  father and mother; Hypertension in her brother, mother, and sister; Hypothyroidism in her daughter and daughter. Social History:  Patient  reports that she has never smoked. She has never used smokeless tobacco. She reports current alcohol use. She reports that she does not use drugs.  Review of Systems: Constitutional: negative for fever or malaise Ophthalmic: negative for photophobia, double vision or loss of vision Cardiovascular: negative for chest pain, dyspnea on exertion, or new LE swelling Respiratory: negative for SOB or persistent cough Gastrointestinal: negative for abdominal pain, change in bowel habits or melena Genitourinary: negative for dysuria or gross hematuria, no abnormal uterine bleeding or disharge Musculoskeletal: negative for new gait disturbance or muscular weakness Integumentary: negative for new or persistent rashes, no breast lumps Neurological: negative for TIA or stroke symptoms Psychiatric: negative for SI or delusions Allergic/Immunologic: negative for hives  Patient Care Team    Relationship Specialty Notifications Start End  Willow Ora, MD PCP - General Family Medicine  02/12/18   Eldred Manges, MD (Inactive) Consulting Physician Orthopedic  Surgery  08/15/17   Obgyn, [facility]    02/21/18   Jonelle Sidle, MD Consulting Physician Cardiology  02/21/18   Derryl Harbor, OD Consulting Physician Optometry  03/24/19   Erroll Luna, [facility] (Inactive)  Pharmacist  04/05/21    Comment: [phone]    Objective  Vitals: BP 136/79 Comment: grieving loss of nephew 2 weeks ago  Pulse 83   Temp 98.4 F (36.9 C)   Ht 5\' 3"  (1.6 m)   Wt 174 lb (78.9 kg)   SpO2 96%   BMI 30.82 kg/m  General:  Well developed, well nourished, no acute distress  Psych:  Alert and orientedx3,normal mood and affect HEENT:  Normocephalic, atraumatic, non-icteric sclera,  supple neck without adenopathy, mass or thyromegaly Cardiovascular:  Normal S1, S2, RRR without gallop,  rub + 2/6 systolic murmur Respiratory:  Good breath sounds bilaterally, CTAB with normal respiratory effort Gastrointestinal: normal bowel sounds, soft, non-tender, no noted masses. No HSM MSK: extremities without edema, joints without erythema or swelling, + OA changes in hands. No crepitus or swelling in knees Neurologic:    Mental status is normal.  Gross motor and sensory exams are normal.  No tremor  Commons side effects, risks, benefits, and alternatives for medications and treatment plan prescribed today were discussed, and the patient expressed understanding of the given instructions. Patient is instructed to call or message via MyChart if he/she has any questions or concerns regarding our treatment plan. No barriers to understanding were identified. We discussed Red Flag symptoms and signs in detail. Patient expressed understanding regarding what to do in case of urgent or emergency type symptoms.  Medication list was reconciled, printed and provided to the patient in AVS. Patient instructions and summary information was reviewed with the patient as documented in the AVS. This note was prepared with assistance of Dragon voice recognition software. Occasional wrong-word or sound-a-like substitutions may have occurred due to the inherent limitations of voice recognition software

## 2023-09-20 ENCOUNTER — Other Ambulatory Visit: Payer: Self-pay | Admitting: Family Medicine

## 2023-09-20 DIAGNOSIS — Z780 Asymptomatic menopausal state: Secondary | ICD-10-CM

## 2023-09-20 DIAGNOSIS — M8580 Other specified disorders of bone density and structure, unspecified site: Secondary | ICD-10-CM

## 2023-10-01 ENCOUNTER — Encounter: Payer: Self-pay | Admitting: Family Medicine

## 2023-10-01 NOTE — Progress Notes (Signed)
 Labs reviewed.  The 10-year ASCVD risk score (Arnett DK, et al., 2019) is: 21.3% on livalo  consider adding zetia    Values used to calculate the score:     Age: 73 years     Sex: Female     Is Non-Hispanic African American: No     Diabetic: No     Tobacco smoker: No     Systolic Blood Pressure: 136 mmHg     Is BP treated: Yes     HDL Cholesterol: 45.6 mg/dL     Total Cholesterol: 197 mg/dL

## 2023-10-02 MED ORDER — EZETIMIBE 10 MG PO TABS: 10.0000 mg | ORAL_TABLET | Freq: Every day | ORAL | 3 refills | Status: AC

## 2023-10-09 ENCOUNTER — Encounter: Payer: Self-pay | Admitting: Family Medicine

## 2023-10-09 MED ORDER — TRAMADOL HCL 50 MG PO TABS: 50.0000 mg | ORAL_TABLET | Freq: Four times a day (QID) | ORAL | 1 refills | Status: DC | PRN

## 2023-10-19 ENCOUNTER — Other Ambulatory Visit: Payer: Self-pay | Admitting: Family Medicine

## 2023-10-26 ENCOUNTER — Other Ambulatory Visit: Payer: Self-pay | Admitting: Family Medicine

## 2023-11-23 ENCOUNTER — Other Ambulatory Visit: Payer: Self-pay | Admitting: Family Medicine

## 2023-12-17 ENCOUNTER — Other Ambulatory Visit: Payer: Self-pay

## 2023-12-17 ENCOUNTER — Emergency Department (HOSPITAL_BASED_OUTPATIENT_CLINIC_OR_DEPARTMENT_OTHER)
Admission: EM | Admit: 2023-12-17 | Discharge: 2023-12-17 | Disposition: A | Discharge status: Home / Self Care | Source: Home / Self Care | Attending: Student | Admitting: Student

## 2023-12-17 ENCOUNTER — Encounter (HOSPITAL_BASED_OUTPATIENT_CLINIC_OR_DEPARTMENT_OTHER): Payer: Self-pay

## 2023-12-17 DIAGNOSIS — D72829 Elevated white blood cell count, unspecified: Secondary | ICD-10-CM | POA: Insufficient documentation

## 2023-12-17 DIAGNOSIS — E039 Hypothyroidism, unspecified: Secondary | ICD-10-CM | POA: Insufficient documentation

## 2023-12-17 DIAGNOSIS — R300 Dysuria: Secondary | ICD-10-CM | POA: Insufficient documentation

## 2023-12-17 DIAGNOSIS — Z79899 Other long term (current) drug therapy: Secondary | ICD-10-CM | POA: Insufficient documentation

## 2023-12-17 DIAGNOSIS — I10 Essential (primary) hypertension: Secondary | ICD-10-CM | POA: Insufficient documentation

## 2023-12-17 DIAGNOSIS — N10 Acute tubulo-interstitial nephritis: Secondary | ICD-10-CM | POA: Diagnosis not present

## 2023-12-17 DIAGNOSIS — R7881 Bacteremia: Secondary | ICD-10-CM | POA: Diagnosis not present

## 2023-12-17 LAB — CBC WITH DIFFERENTIAL/PLATELET
Abs Immature Granulocytes: 0.07 K/uL (ref 0.00–0.07)
Basophils Absolute: 0.1 K/uL (ref 0.0–0.1)
Basophils Relative: 0 %
Eosinophils Absolute: 0 K/uL (ref 0.0–0.5)
Eosinophils Relative: 0 %
HCT: 43.3 % (ref 36.0–46.0)
Hemoglobin: 14 g/dL (ref 12.0–15.0)
Immature Granulocytes: 1 %
Lymphocytes Relative: 4 %
Lymphs Abs: 0.5 K/uL — ABNORMAL LOW (ref 0.7–4.0)
MCH: 28.7 pg (ref 26.0–34.0)
MCHC: 32.3 g/dL (ref 30.0–36.0)
MCV: 88.7 fL (ref 80.0–100.0)
Monocytes Absolute: 0.9 K/uL (ref 0.1–1.0)
Monocytes Relative: 8 %
Neutro Abs: 10.3 K/uL — ABNORMAL HIGH (ref 1.7–7.7)
Neutrophils Relative %: 87 %
Platelets: 288 K/uL (ref 150–400)
RBC: 4.88 MIL/uL (ref 3.87–5.11)
RDW: 15.8 % — ABNORMAL HIGH (ref 11.5–15.5)
WBC: 11.9 K/uL — ABNORMAL HIGH (ref 4.0–10.5)
nRBC: 0 % (ref 0.0–0.2)

## 2023-12-17 LAB — BASIC METABOLIC PANEL WITH GFR
Anion gap: 17 — ABNORMAL HIGH (ref 5–15)
BUN: 19 mg/dL (ref 8–23)
CO2: 19 mmol/L — ABNORMAL LOW (ref 22–32)
Calcium: 10.4 mg/dL — ABNORMAL HIGH (ref 8.9–10.3)
Chloride: 100 mmol/L (ref 98–111)
Creatinine, Ser: 1.23 mg/dL — ABNORMAL HIGH (ref 0.44–1.00)
GFR, Estimated: 46 mL/min — ABNORMAL LOW (ref >60)
Glucose, Bld: 145 mg/dL — ABNORMAL HIGH (ref 70–99)
Potassium: 3.9 mmol/L (ref 3.5–5.1)
Sodium: 136 mmol/L (ref 135–145)

## 2023-12-17 LAB — URINALYSIS, ROUTINE W REFLEX MICROSCOPIC
Bilirubin Urine: NEGATIVE
Glucose, UA: NEGATIVE mg/dL
Ketones, ur: NEGATIVE mg/dL
Nitrite: NEGATIVE
Protein, ur: 30 mg/dL — AB
Specific Gravity, Urine: 1.018 (ref 1.005–1.030)
WBC, UA: >50 WBC/hpf (ref 0–5)
pH: 5.5 (ref 5.0–8.0)

## 2023-12-17 LAB — LACTIC ACID, PLASMA: Lactic Acid, Venous: 1.9 mmol/L (ref 0.5–1.9)

## 2023-12-17 MED ORDER — LACTATED RINGERS IV BOLUS
1000.0000 mL | Freq: Once | INTRAVENOUS | Status: AC
  Administered 2023-12-17: 1000 mL via INTRAVENOUS

## 2023-12-17 MED ORDER — SODIUM CHLORIDE 0.9 % IV SOLN
2.0000 g | Freq: Once | INTRAVENOUS | Status: AC
  Administered 2023-12-17: 2 g via INTRAVENOUS
  Filled 2023-12-17: qty 20

## 2023-12-17 MED ORDER — ACETAMINOPHEN 500 MG PO TABS
1000.0000 mg | ORAL_TABLET | Freq: Once | ORAL | Status: AC
  Administered 2023-12-17: 1000 mg via ORAL
  Filled 2023-12-17: qty 2

## 2023-12-17 MED ORDER — CEFADROXIL 500 MG PO CAPS: 500.0000 mg | ORAL_CAPSULE | Freq: Two times a day (BID) | ORAL | 0 refills | Status: DC

## 2023-12-17 NOTE — ED Triage Notes (Signed)
 Pt presents via POV c/o UTI symptoms since Thursday. Reports taking OTC medication without symptoms relief.   Reports taking AZO and cranberry juice for symptoms.

## 2023-12-17 NOTE — ED Notes (Signed)
 RN reviewed discharge instructions with pt. Pt verbalized understanding and had no further questions. VSS upon discharge.

## 2023-12-18 ENCOUNTER — Ambulatory Visit: Admitting: Family Medicine

## 2023-12-18 VITALS — BP 103/68 | HR 99 | Temp 98.0°F | Ht 63.0 in | Wt 170.6 lb

## 2023-12-18 DIAGNOSIS — R350 Frequency of micturition: Secondary | ICD-10-CM

## 2023-12-18 DIAGNOSIS — N12 Tubulo-interstitial nephritis, not specified as acute or chronic: Secondary | ICD-10-CM | POA: Diagnosis not present

## 2023-12-18 LAB — BLOOD CULTURE ID PANEL (REFLEXED) - BCID2

## 2023-12-18 LAB — POCT URINALYSIS DIPSTICK
Bilirubin, UA: 1
Glucose, UA: NEGATIVE
Ketones, UA: NEGATIVE
Nitrite, UA: POSITIVE
Protein, UA: POSITIVE — AB
Spec Grav, UA: 1.02 (ref 1.010–1.025)
Urobilinogen, UA: 0.2 U/dL — AB
pH, UA: 5 (ref 5.0–8.0)

## 2023-12-18 MED ORDER — CEFTRIAXONE SODIUM 500 MG IJ SOLR
500.0000 mg | Freq: Once | INTRAMUSCULAR | Status: AC
  Administered 2023-12-18 (×2): 500 mg via INTRAMUSCULAR

## 2023-12-18 MED ORDER — CIPROFLOXACIN HCL 500 MG PO TABS: 500.0000 mg | ORAL_TABLET | Freq: Two times a day (BID) | ORAL | 0 refills | Status: DC

## 2023-12-18 MED ORDER — CEFTRIAXONE SODIUM 500 MG IJ SOLR: 500.0000 mg | Freq: Once | INTRAMUSCULAR | Status: DC

## 2023-12-18 NOTE — Patient Instructions (Signed)
 Please follow up if symptoms do not improve or as needed.    Pyelonephritis, Adult Pyelonephritis is an infection that occurs in the kidney. The kidneys are the organs that filter the blood and move waste from the bloodstream to the urine. Urine passes out of the kidneys through tubes called ureters and goes into the bladder. There are two main types of this condition: Acute pyelonephritis. These are infections that come on quickly and without any warning. Chronic pyelonephritis. These infections last for a long time. In most cases, the infection clears up with treatment. In more severe cases, an infection can spread to the bloodstream or lead to other problems with the kidneys. What are the causes? This condition is often caused by bacteria. The bacteria may travel: From the bladder up to the kidney. This may happen after you have a bladder infection (cystitis) or urinary tract infection (UTI). From the bloodstream to the kidney. What increases the risk? You are more likely to develop this condition if: You are female. Your risk is even higher if you are pregnant. You are older. You have: Diabetes. Prostatitis. This is inflammation of the prostate gland. Kidney stones or bladder stones. Problems with your kidneys or ureters. Cancer. Spinal cord injury or nerve damage around the bladder. You have a soft tube (catheter) placed in your bladder. You are sexually active and use spermicides. You have or have had a UTI. What are the signs or symptoms? Symptoms of this condition include: An urge to urinate that is strong or does not go away. You may also urinate more often than normal. A burning or stinging feeling when you urinate. Pain. This may be in your abdomen, back, side, or groin. Fever or chills. Nausea or vomiting. Urine that is bloody, dark, cloudy, or smells bad. How is this diagnosed? This condition may be diagnosed based on your medical history and a physical exam. You may  also have tests, such as: Urine tests. Blood tests. Imaging tests of the kidneys. These may include an ultrasound or CT scan. How is this treated? Treatment for this condition depends on the severity of the infection. If the infection is mild and found early, you may be given antibiotics to take by mouth. You will need to drink lots of fluids. If the infection is more severe, you may need to stay in the hospital and receive antibiotics through an IV. You may also get fluids through an IV. After you leave the hospital, you may need to take antibiotics by mouth. Other treatments may be needed. These will depend on the cause of the infection. Follow these instructions at home: Eating and drinking Drink enough fluid to keep your urine pale yellow. Avoid caffeine, tea, and carbonated drinks. These can irritate the bladder. General instructions Take over-the-counter and prescription medicines as told by your health care provider. Finish your antibiotics even if you start to feel better. Urinate often. Avoid holding in urine for long periods of time. Urinate before and after sex. If you are female, cleanse from front to back after a bowel movement. Use each tissue only once. Keep all follow-up visits. Your health care provider will want to make sure your infection is gone. Contact a health care provider if: Your symptoms do not get better after 2 days of treatment. Your symptoms get worse. You have a fever or chills. You cannot take your antibiotics. Get help right away if: You vomit each time that you eat or drink. You have severe pain in  your back or side. You are very weak, or you faint. This information is not intended to replace advice given to you by your health care provider. Make sure you discuss any questions you have with your health care provider. Document Revised: 12/19/2021 Document Reviewed: 12/19/2021 Elsevier Patient Education  2024 [employer].

## 2023-12-18 NOTE — Progress Notes (Signed)
 Subjective   CC:  Chief Complaint  Patient presents with   Urinary Frequency    Frequent urination since Thursday and burning. Pt was seen in the urgent Care last night     HPI: Kelly Barajas is a 73 y.o. female who presents to the office today to address the problems listed above in the chief complaint. 73 year old female presents due to shaking chills.  She was seen in the emergency room yesterday.  I reviewed all notes, lab work and treatment plan.  To summarize, she started having dysuria several days ago.  Started having shaking chills and mild right-sided flank pain yesterday.  In emergency room, she had suprapubic tenderness, and abnormal urine with greater than 50 white blood cells and bacteria, white blood count was elevated at 11.9.  She was treated with IV fluids and 2 g ceftriaxone .  She was discharged with a prescription for Duricef.  Overnight, her urinary frequency and dysuria has improved, however shaking chills again awoke her.  She has not measured her temperature.  However, her partner says she was extremely hot last night in the bed.  No gross hematuria.  No nausea or vomiting.  No history of pyelonephritis.  Assessment  1. Pyelonephritis   2. Frequent urination      Plan  Dysuria, urinary frequency, elevated white blood cell count, mild right flank pain and shaking chills all consistent with early pyelonephritis.  Patient is able to take oral antibiotics.  No nausea or vomiting.  No fever this morning.  I repeated ceftriaxone  1 g IM.  Will discharge on Cipro  500 twice daily.  Await urine culture.  Hydrate.  Monitor temperature curve.  She will follow-up with me if not improving in the next 48 to 72 hours.  Red flag symptoms discussed.  Follow up: as needed  Orders Placed This Encounter  Procedures   POCT Urinalysis Dipstick   Meds ordered this encounter  Medications   ciprofloxacin  (CIPRO ) 500 MG tablet    Sig: Take 1 tablet (500 mg total) by mouth 2 (two)  times daily for 7 days.    Dispense:  14 tablet    Refill:  0   cefTRIAXone  (ROCEPHIN ) injection 500 mg   cefTRIAXone  (ROCEPHIN ) injection 500 mg      I reviewed the patients updated PMH, FH, and SocHx.    Patient Active Problem List   Diagnosis Date Noted   Hypothyroidism 06/01/2014    Priority: High   Essential hypertension 11/27/2013    Priority: High   Mixed hyperlipidemia 10/04/2012    Priority: High   Osteopenia 08/21/2019    Priority: Medium    Gastroesophageal reflux disease without esophagitis 11/27/2013    Priority: Medium    Aortic valve sclerosis systolic heart murmur 09/28/2022    Priority: Low   S/P total knee arthroplasty, left 10/28/2021    Priority: Low   Elevated LFTs 09/01/2020    Priority: Low   Bilateral primary osteoarthritis of knee 02/19/2019    Priority: Low   Obesity (BMI 30-39.9) 09/05/2016    Priority: Low   Overactive bladder 03/07/2016    Priority: Low   Vitamin D  deficiency 03/07/2016    Priority: Low   Allergic rhinitis due to pollen 10/24/2022   Current Meds  Medication Sig   Calcium  Citrate-Vitamin D  (CALCIUM  + D PO) Take 1 tablet by mouth 2 (two) times daily.   cefadroxil  (DURICEF) 500 MG capsule Take 1 capsule (500 mg total) by mouth 2 (two) times daily  for 7 days.   ciprofloxacin  (CIPRO ) 500 MG tablet Take 1 tablet (500 mg total) by mouth 2 (two) times daily for 7 days.   Coenzyme Q10 (COQ10) 100 MG CAPS Take 100 mg by mouth daily.   ezetimibe  (ZETIA ) 10 MG tablet Take 1 tablet (10 mg total) by mouth daily.   lisinopril -hydrochlorothiazide  (ZESTORETIC ) 20-12.5 MG tablet Take 1 tablet by mouth once daily   magnesium  gluconate (MAGONATE) 500 MG tablet Take 500 mg by mouth in the morning and at bedtime.   Naproxen Sodium 220 MG CAPS Take by mouth in the morning and at bedtime.   Omega-3 Fatty Acids (FISH OIL PO) Take 2,000 mg by mouth 2 (two) times daily.   omeprazole (PRILOSEC) 20 MG capsule Take 20 mg by mouth daily.   OVER THE  COUNTER MEDICATION Inhale 1 puff into the lungs daily as needed (congestion). himalayan pink salt inhaler   Pitavastatin  Calcium  4 MG TABS Take 1 tablet by mouth once daily   SYNTHROID  88 MCG tablet TAKE 1 TABLET BY MOUTH ONCE DAILY BEFORE BREAKFAST   traMADol  (ULTRAM ) 50 MG tablet Take 1 tablet (50 mg total) by mouth every 6 (six) hours as needed for moderate pain (pain score 4-6).   Current Facility-Administered Medications for the 12/18/23 encounter (Office Visit) with [name], MD  Medication   cefTRIAXone  (ROCEPHIN ) injection 500 mg   cefTRIAXone  (ROCEPHIN ) injection 500 mg    Review of Systems: Cardiovascular: negative for chest pain Respiratory: negative for SOB or persistent cough Gastrointestinal: negative for abdominal pain Constitutional: Negative for fever malaise or anorexia  Objective  Vitals: BP 103/68   Pulse 99   Temp 98 F (36.7 C)   Ht 5' 3 (1.6 m)   Wt 170 lb 9.6 oz (77.4 kg)   SpO2 93%   BMI 30.22 kg/m  General: no acute distress  Psych:  Alert and oriented, normal mood and affect Cardiovascular:  RRR without murmur or gallop. no peripheral edema Respiratory:  Good breath sounds bilaterally, CTAB with normal respiratory effort Gastrointestinal: soft, flat abdomen, normal active bowel sounds, no palpable masses, no hepatosplenomegaly, no appreciated hernias, NO CVAT, mild suprapubic ttp w/o rebound or guarding Skin:  Warm, no rashes Neurologic:   Mental status is normal. normal gait Office Visit on 12/18/2023  Component Date Value Ref Range Status   Color, UA 12/18/2023 dark yellow   Final   Clarity, UA 12/18/2023 cloudy   Final   Glucose, UA 12/18/2023 Negative  Negative Final   Bilirubin, UA 12/18/2023 1   Final   Ketones, UA 12/18/2023 negative   Final   Spec Grav, UA 12/18/2023 1.020  1.010 - 1.025 Final   Blood, UA 12/18/2023 1+   Final   pH, UA 12/18/2023 5.0  5.0 - 8.0 Final   Protein, UA 12/18/2023 Positive (A)  Negative Final    Urobilinogen, UA 12/18/2023 0.2 (A)  0.2 or 1.0 E.U./dL Final   Nitrite, UA 92/91/7974 positive   Final   Leukocytes, UA 12/18/2023 Large (3+) (A)  Negative Final   Lab Results  Component Value Date   WBC 11.9 (H) 12/17/2023   HGB 14.0 12/17/2023   HCT 43.3 12/17/2023   MCV 88.7 12/17/2023   PLT 288 12/17/2023   Lab Results  Component Value Date   NA 136 12/17/2023   CL 100 12/17/2023   K 3.9 12/17/2023   CO2 19 (L) 12/17/2023   BUN 19 12/17/2023   CREATININE 1.23 (H) 12/17/2023   GFRNONAA  46 (L) 12/17/2023   CALCIUM  10.4 (H) 12/17/2023   ALBUMIN 4.6 09/18/2023   GLUCOSE 145 (H) 12/17/2023     Commons side effects, risks, benefits, and alternatives for medications and treatment plan prescribed today were discussed, and the patient expressed understanding of the given instructions. Patient is instructed to call or message via MyChart if he/she has any questions or concerns regarding our treatment plan. No barriers to understanding were identified. We discussed Red Flag symptoms and signs in detail. Patient expressed understanding regarding what to do in case of urgent or emergency type symptoms.  Medication list was reconciled, printed and provided to the patient in AVS. Patient instructions and summary information was reviewed with the patient as documented in the AVS. This note was prepared with assistance of Dragon voice recognition software. Occasional wrong-word or sound-a-like substitutions may have occurred due to the inherent limitations of voice recognition software

## 2023-12-18 NOTE — ED Notes (Signed)
 Pt was called per Dr Nettie to advise to go to [facility] to be seen for GRAM - RODS IN ANAEROBIC CULTURE BCID - E.COLI NO RESISTANCE DETECTED

## 2023-12-18 NOTE — ED Provider Notes (Signed)
 [place] EMERGENCY DEPARTMENT AT [facility] Provider Note  CSN: [id number] Arrival date & time: 12/17/23 1902  Chief Complaint(s) Dysuria  HPI Kelly Barajas is a 73 y.o. female who presents for evaluation of dysuria, increased frequency.  States that she has had symptoms for the last 5 days that have been progressively worsening.  Has been trialing Azo and cranberry juice without improvement.  Endorses fever, increased frequency, dysuria but denies vaginal bleed or discharge.  Denies chest pain, shortness of breath, flank pain, nausea, vomiting or other systemic symptoms.   Past Medical History Past Medical History:  Diagnosis Date   Allergy    Nickel   Arthritis    Essential hypertension 11/27/2013   GERD (gastroesophageal reflux disease)    Hepatitis    Diagnosed 50 years ago, unsure of which type, but she did have jaundice.   Hyperlipidemia    Hypothyroidism    Osteopenia 08/21/2019   Patient Active Problem List   Diagnosis Date Noted   Allergic rhinitis due to pollen 10/24/2022   Aortic valve sclerosis systolic heart murmur 09/28/2022   S/P total knee arthroplasty, left 10/28/2021   Elevated LFTs 09/01/2020   Osteopenia 08/21/2019   Bilateral primary osteoarthritis of knee 02/19/2019   Obesity (BMI 30-39.9) 09/05/2016   Overactive bladder 03/07/2016   Vitamin D  deficiency 03/07/2016   Hypothyroidism 06/01/2014   Gastroesophageal reflux disease without esophagitis 11/27/2013   Essential hypertension 11/27/2013   Mixed hyperlipidemia 10/04/2012   Home Medication(s) Prior to Admission medications   Medication Sig Start Date End Date Taking? Authorizing Provider  cefadroxil  (DURICEF) 500 MG capsule Take 1 capsule (500 mg total) by mouth 2 (two) times daily for 7 days. 12/17/23 12/24/23 Yes Zarin Hagmann, MD  Calcium  Citrate-Vitamin D  (CALCIUM  + D PO) Take 1 tablet by mouth 2 (two) times daily.    [provider]  Coenzyme Q10 (COQ10) 100 MG CAPS  Take 100 mg by mouth daily.    [provider]  ezetimibe  (ZETIA ) 10 MG tablet Take 1 tablet (10 mg total) by mouth daily. 10/02/23   [name], MD  lisinopril -hydrochlorothiazide  (ZESTORETIC ) 20-12.5 MG tablet Take 1 tablet by mouth once daily 11/23/23   [name], MD  magnesium  gluconate (MAGONATE) 500 MG tablet Take 500 mg by mouth in the morning and at bedtime.    [provider]  Naproxen Sodium 220 MG CAPS Take by mouth in the morning and at bedtime.    [provider]  Omega-3 Fatty Acids (FISH OIL PO) Take 2,000 mg by mouth 2 (two) times daily.    [provider]  omeprazole (PRILOSEC) 20 MG capsule Take 20 mg by mouth daily.    [provider]  OVER THE COUNTER MEDICATION Inhale 1 puff into the lungs daily as needed (congestion). himalayan pink salt inhaler    [provider]  Pitavastatin  Calcium  4 MG TABS Take 1 tablet by mouth once daily 10/19/23   [name], MD  SYNTHROID  88 MCG tablet TAKE 1 TABLET BY MOUTH ONCE DAILY BEFORE BREAKFAST 10/26/23   [name], MD  traMADol  (ULTRAM ) 50 MG tablet Take 1 tablet (50 mg total) by mouth every 6 (six) hours as needed for moderate pain (pain score 4-6). 10/09/23   [name], MD  Past Surgical History Past Surgical History:  Procedure Laterality Date   DILATION AND CURETTAGE OF UTERUS     DILATION AND CURETTAGE, DIAGNOSTIC / THERAPEUTIC     INJECTION KNEE Right 09/14/2021   Procedure: KNEE INJECTION;  Surgeon: Barbarann Oneil BROCKS, MD;  Location: [facility] OR;  Service: Orthopedics;  Laterality: Right;   KNEE ARTHROSCOPY W/ MENISCAL REPAIR Right 07/16/2017   TONSILLECTOMY     TOTAL KNEE ARTHROPLASTY Left 09/14/2021   Procedure: LEFT TOTAL KNEE ARTHROPLASTY;  Surgeon: Barbarann Oneil BROCKS, MD;  Location: [facility] OR;  Service: Orthopedics;  Laterality: Left;   Family  History Family History  Problem Relation Age of Onset   Hyperlipidemia Mother    Diabetes Mellitus II Mother    CAD Mother    Hypertension Mother    Depression Mother    Diabetes Mother    Arthritis Mother    CAD Father    Hyperlipidemia Father    Heart attack Father    Hypertension Sister    Hypertension Brother    Hypothyroidism Daughter    Colon cancer Maternal Grandmother 43   Hypothyroidism Daughter     Social History Social History   Tobacco Use   Smoking status: Never   Smokeless tobacco: Never  Vaping Use   Vaping status: Never Used  Substance Use Topics   Alcohol use: Yes    Comment: socially   Drug use: No   Allergies Nickel  Review of Systems Review of Systems  Genitourinary:  Positive for dysuria and frequency.    Physical Exam Vital Signs  I have reviewed the triage vital signs BP (!) 114/59   Pulse 79   Temp 100.3 F (37.9 C) (Oral)   Resp 18   Ht 5' 3 (1.6 m)   Wt 78.9 kg   SpO2 92%   BMI 30.81 kg/m   Physical Exam Vitals and nursing note reviewed.  Constitutional:      General: She is not in acute distress.    Appearance: She is well-developed.  HENT:     Head: Normocephalic and atraumatic.  Eyes:     Conjunctiva/sclera: Conjunctivae normal.  Cardiovascular:     Rate and Rhythm: Normal rate and regular rhythm.     Heart sounds: No murmur heard. Pulmonary:     Effort: Pulmonary effort is normal. No respiratory distress.     Breath sounds: Normal breath sounds.  Abdominal:     Palpations: Abdomen is soft.     Tenderness: There is abdominal tenderness.  Musculoskeletal:        General: No swelling.     Cervical back: Neck supple.  Skin:    General: Skin is warm and dry.     Capillary Refill: Capillary refill takes less than 2 seconds.  Neurological:     Mental Status: She is alert.  Psychiatric:        Mood and Affect: Mood normal.     ED Results and Treatments Labs (all labs ordered are listed, but only abnormal  results are displayed) Labs Reviewed  CBC WITH DIFFERENTIAL/PLATELET - Abnormal; Notable for the following components:      Result Value   WBC 11.9 (*)    RDW 15.8 (*)    Neutro Abs 10.3 (*)    Lymphs Abs 0.5 (*)    All other components within normal limits  BASIC METABOLIC PANEL WITH GFR - Abnormal; Notable for the following components:   CO2 19 (*)    Glucose, Bld 145 (*)  Creatinine, Ser 1.23 (*)    Calcium  10.4 (*)    GFR, Estimated 46 (*)    Anion gap 17 (*)    All other components within normal limits  URINALYSIS, ROUTINE W REFLEX MICROSCOPIC - Abnormal; Notable for the following components:   APPearance HAZY (*)    Hgb urine dipstick SMALL (*)    Protein, ur 30 (*)    Leukocytes,Ua LARGE (*)    Bacteria, UA MANY (*)    Crystals PRESENT (*)    All other components within normal limits  CULTURE, BLOOD (SINGLE)  URINE CULTURE  LACTIC ACID, PLASMA                                                                                                                          Radiology No results found.  Pertinent labs & imaging results that were available during my care of the patient were reviewed by me and considered in my medical decision making (see MDM for details).  Medications Ordered in ED Medications  acetaminophen  (TYLENOL ) tablet 1,000 mg (1,000 mg Oral Given 12/17/23 2059)  cefTRIAXone  (ROCEPHIN ) 2 g in sodium chloride  0.9 % 100 mL IVPB (0 g Intravenous Stopped 12/17/23 2154)  lactated ringers  bolus 1,000 mL (0 mLs Intravenous Stopped 12/17/23 2154)                                                                                                                                     Procedures Procedures  (including critical care time)  Medical Decision Making / ED Course   This patient presents to the ED for concern of dysuria, this involves an extensive number of treatment options, and is a complaint that carries with it a high risk of complications and morbidity.   The differential diagnosis includes UTI, nephrolithiasis, bladder stone, bladder spasms  MDM: Patient seen emergency room for evaluation of dysuria.  Physical exam with suprapubic tenderness but is otherwise unremarkable.  Laboratory evaluation with a leukocytosis to 11.9, creatinine 1.23, calcium  10.4 which which appears to be chronically elevated for this patient, urinalysis consistent with infection with large leuk esterase, 11-20 red blood cells, greater than 50 white blood cells and many bacteria.  Urine culture sent.  With no flank pain I have lower suspicion for nephrolithiasis or septic stone.  Patient's resuscitated and started on ceftriaxone .  Will be discharged on Duricef and will tailor further  antibiotics to culture data.  At this time she does not meet inpatient criteria for admission and will be discharged outpatient follow-up.   Additional history obtained: -Additional history obtained from husband -External records from outside source obtained and reviewed including: Chart review including previous notes, labs, imaging, consultation notes   Lab Tests: -I ordered, reviewed, and interpreted labs.   The pertinent results include:   Labs Reviewed  CBC WITH DIFFERENTIAL/PLATELET - Abnormal; Notable for the following components:      Result Value   WBC 11.9 (*)    RDW 15.8 (*)    Neutro Abs 10.3 (*)    Lymphs Abs 0.5 (*)    All other components within normal limits  BASIC METABOLIC PANEL WITH GFR - Abnormal; Notable for the following components:   CO2 19 (*)    Glucose, Bld 145 (*)    Creatinine, Ser 1.23 (*)    Calcium  10.4 (*)    GFR, Estimated 46 (*)    Anion gap 17 (*)    All other components within normal limits  URINALYSIS, ROUTINE W REFLEX MICROSCOPIC - Abnormal; Notable for the following components:   APPearance HAZY (*)    Hgb urine dipstick SMALL (*)    Protein, ur 30 (*)    Leukocytes,Ua LARGE (*)    Bacteria, UA MANY (*)    Crystals PRESENT (*)    All other  components within normal limits  CULTURE, BLOOD (SINGLE)  URINE CULTURE  LACTIC ACID, PLASMA    Medicines ordered and prescription drug management: Meds ordered this encounter  Medications   acetaminophen  (TYLENOL ) tablet 1,000 mg   cefTRIAXone  (ROCEPHIN ) 2 g in sodium chloride  0.9 % 100 mL IVPB    Antibiotic Indication::   UTI   lactated ringers  bolus 1,000 mL   cefadroxil  (DURICEF) 500 MG capsule    Sig: Take 1 capsule (500 mg total) by mouth 2 (two) times daily for 7 days.    Dispense:  14 capsule    Refill:  0    -I have reviewed the patients home medicines and have made adjustments as needed  Critical interventions none    Cardiac Monitoring: The patient was maintained on a cardiac monitor.  I personally viewed and interpreted the cardiac monitored which showed an underlying rhythm of: NSR  Social Determinants of Health:  Factors impacting patients care include: none   Reevaluation: After the interventions noted above, I reevaluated the patient and found that they have :improved  Co morbidities that complicate the patient evaluation  Past Medical History:  Diagnosis Date   Allergy    Nickel   Arthritis    Essential hypertension 11/27/2013   GERD (gastroesophageal reflux disease)    Hepatitis    Diagnosed 50 years ago, unsure of which type, but she did have jaundice.   Hyperlipidemia    Hypothyroidism    Osteopenia 08/21/2019      Dispostion: I considered admission for this patient, but at this time she does not meet inpatient criteria for admission and will be discharged with outpatient follow-up     Final Clinical Impression(s) / ED Diagnoses Final diagnoses:  Dysuria     @PCDICTATION @    Chiquetta Langner, Lum, MD 12/18/23 0200

## 2023-12-19 ENCOUNTER — Inpatient Hospital Stay (HOSPITAL_COMMUNITY)
Admission: EM | Admit: 2023-12-19 | Discharge: 2023-12-21 | DRG: 690 | Disposition: A | Discharge status: Home / Self Care | Source: Ambulatory Visit | Attending: Internal Medicine | Admitting: Internal Medicine

## 2023-12-19 ENCOUNTER — Emergency Department (HOSPITAL_COMMUNITY)

## 2023-12-19 ENCOUNTER — Inpatient Hospital Stay (HOSPITAL_COMMUNITY)

## 2023-12-19 ENCOUNTER — Other Ambulatory Visit: Payer: Self-pay

## 2023-12-19 ENCOUNTER — Encounter (HOSPITAL_COMMUNITY): Payer: Self-pay

## 2023-12-19 DIAGNOSIS — R072 Precordial pain: Secondary | ICD-10-CM | POA: Diagnosis not present

## 2023-12-19 DIAGNOSIS — K219 Gastro-esophageal reflux disease without esophagitis: Secondary | ICD-10-CM | POA: Diagnosis present

## 2023-12-19 DIAGNOSIS — Z96652 Presence of left artificial knee joint: Secondary | ICD-10-CM | POA: Diagnosis present

## 2023-12-19 DIAGNOSIS — I358 Other nonrheumatic aortic valve disorders: Secondary | ICD-10-CM | POA: Diagnosis not present

## 2023-12-19 DIAGNOSIS — N10 Acute tubulo-interstitial nephritis: Secondary | ICD-10-CM | POA: Diagnosis not present

## 2023-12-19 DIAGNOSIS — N390 Urinary tract infection, site not specified: Secondary | ICD-10-CM | POA: Diagnosis not present

## 2023-12-19 DIAGNOSIS — I10 Essential (primary) hypertension: Secondary | ICD-10-CM | POA: Diagnosis present

## 2023-12-19 DIAGNOSIS — Z79899 Other long term (current) drug therapy: Secondary | ICD-10-CM | POA: Diagnosis not present

## 2023-12-19 DIAGNOSIS — R079 Chest pain, unspecified: Secondary | ICD-10-CM | POA: Diagnosis present

## 2023-12-19 DIAGNOSIS — B9620 Unspecified Escherichia coli [E. coli] as the cause of diseases classified elsewhere: Secondary | ICD-10-CM | POA: Diagnosis present

## 2023-12-19 DIAGNOSIS — E039 Hypothyroidism, unspecified: Secondary | ICD-10-CM | POA: Diagnosis not present

## 2023-12-19 DIAGNOSIS — E785 Hyperlipidemia, unspecified: Secondary | ICD-10-CM | POA: Diagnosis not present

## 2023-12-19 DIAGNOSIS — E871 Hypo-osmolality and hyponatremia: Secondary | ICD-10-CM | POA: Diagnosis not present

## 2023-12-19 DIAGNOSIS — Z79890 Hormone replacement therapy (postmenopausal): Secondary | ICD-10-CM

## 2023-12-19 DIAGNOSIS — N3001 Acute cystitis with hematuria: Secondary | ICD-10-CM | POA: Diagnosis not present

## 2023-12-19 DIAGNOSIS — R7881 Bacteremia: Secondary | ICD-10-CM | POA: Diagnosis not present

## 2023-12-19 DIAGNOSIS — Z8249 Family history of ischemic heart disease and other diseases of the circulatory system: Secondary | ICD-10-CM

## 2023-12-19 DIAGNOSIS — E782 Mixed hyperlipidemia: Secondary | ICD-10-CM | POA: Diagnosis present

## 2023-12-19 DIAGNOSIS — Z0389 Encounter for observation for other suspected diseases and conditions ruled out: Secondary | ICD-10-CM | POA: Diagnosis not present

## 2023-12-19 DIAGNOSIS — K449 Diaphragmatic hernia without obstruction or gangrene: Secondary | ICD-10-CM | POA: Diagnosis not present

## 2023-12-19 LAB — CBC WITH DIFFERENTIAL/PLATELET
Abs Immature Granulocytes: 0.04 K/uL (ref 0.00–0.07)
Basophils Absolute: 0 K/uL (ref 0.0–0.1)
Basophils Relative: 1 %
Eosinophils Absolute: 0 K/uL (ref 0.0–0.5)
Eosinophils Relative: 0 %
HCT: 38 % (ref 36.0–46.0)
Hemoglobin: 12.1 g/dL (ref 12.0–15.0)
Immature Granulocytes: 1 %
Lymphocytes Relative: 9 %
Lymphs Abs: 0.7 K/uL (ref 0.7–4.0)
MCH: 28.6 pg (ref 26.0–34.0)
MCHC: 31.8 g/dL (ref 30.0–36.0)
MCV: 89.8 fL (ref 80.0–100.0)
Monocytes Absolute: 1 K/uL (ref 0.1–1.0)
Monocytes Relative: 13 %
Neutro Abs: 5.9 K/uL (ref 1.7–7.7)
Neutrophils Relative %: 76 %
Platelets: 211 K/uL (ref 150–400)
RBC: 4.23 MIL/uL (ref 3.87–5.11)
RDW: 15.9 % — ABNORMAL HIGH (ref 11.5–15.5)
WBC: 7.7 K/uL (ref 4.0–10.5)
nRBC: 0 % (ref 0.0–0.2)

## 2023-12-19 LAB — URINALYSIS, W/ REFLEX TO CULTURE (INFECTION SUSPECTED)
Bilirubin Urine: NEGATIVE
Glucose, UA: NEGATIVE mg/dL
Hgb urine dipstick: NEGATIVE
Ketones, ur: NEGATIVE mg/dL
Nitrite: NEGATIVE
Protein, ur: NEGATIVE mg/dL
Specific Gravity, Urine: 1.01 (ref 1.005–1.030)
pH: 6 (ref 5.0–8.0)

## 2023-12-19 LAB — COMPREHENSIVE METABOLIC PANEL WITH GFR
ALT: 20 U/L (ref 0–44)
AST: 28 U/L (ref 15–41)
Albumin: 3.4 g/dL — ABNORMAL LOW (ref 3.5–5.0)
Alkaline Phosphatase: 68 U/L (ref 38–126)
Anion gap: 9 (ref 5–15)
BUN: 15 mg/dL (ref 8–23)
CO2: 23 mmol/L (ref 22–32)
Calcium: 9.1 mg/dL (ref 8.9–10.3)
Chloride: 102 mmol/L (ref 98–111)
Creatinine, Ser: 0.93 mg/dL (ref 0.44–1.00)
GFR, Estimated: >60 mL/min
Glucose, Bld: 118 mg/dL — ABNORMAL HIGH (ref 70–99)
Potassium: 3.5 mmol/L (ref 3.5–5.1)
Sodium: 134 mmol/L — ABNORMAL LOW (ref 135–145)
Total Bilirubin: 0.7 mg/dL (ref 0.0–1.2)
Total Protein: 6.8 g/dL (ref 6.5–8.1)

## 2023-12-19 LAB — PROTIME-INR
INR: 1 (ref 0.8–1.2)
Prothrombin Time: 13.8 s (ref 11.4–15.2)

## 2023-12-19 LAB — I-STAT CG4 LACTIC ACID, ED: Lactic Acid, Venous: 0.9 mmol/L (ref 0.5–1.9)

## 2023-12-19 MED ORDER — EZETIMIBE 10 MG PO TABS
10.0000 mg | ORAL_TABLET | Freq: Every day | ORAL | Status: DC
  Administered 2023-12-20: 10 mg via ORAL
  Filled 2023-12-19 (×2): qty 1

## 2023-12-19 MED ORDER — LEVOTHYROXINE SODIUM 88 MCG PO TABS
88.0000 ug | ORAL_TABLET | Freq: Every day | ORAL | Status: DC
  Administered 2023-12-20 – 2023-12-21 (×2): 88 ug via ORAL
  Filled 2023-12-19 (×2): qty 1

## 2023-12-19 MED ORDER — ACETAMINOPHEN 325 MG PO TABS
650.0000 mg | ORAL_TABLET | Freq: Four times a day (QID) | ORAL | Status: DC | PRN
  Administered 2023-12-19: 650 mg via ORAL
  Filled 2023-12-19: qty 2

## 2023-12-19 MED ORDER — ACETAMINOPHEN 650 MG RE SUPP
650.0000 mg | Freq: Four times a day (QID) | RECTAL | Status: DC | PRN
Start: 2023-12-19 — End: 2023-12-21

## 2023-12-19 MED ORDER — ENOXAPARIN SODIUM 40 MG/0.4ML IJ SOSY
40.0000 mg | PREFILLED_SYRINGE | INTRAMUSCULAR | Status: DC
  Filled 2023-12-19 (×2): qty 0.4

## 2023-12-19 MED ORDER — CEFTRIAXONE SODIUM 2 G IJ SOLR
2.0000 g | Freq: Once | INTRAMUSCULAR | Status: AC
  Administered 2023-12-19: 2 g via INTRAVENOUS
  Filled 2023-12-19: qty 20

## 2023-12-19 MED ORDER — ONDANSETRON HCL 4 MG PO TABS: 4.0000 mg | ORAL_TABLET | Freq: Four times a day (QID) | ORAL | Status: DC | PRN

## 2023-12-19 MED ORDER — PRAVASTATIN SODIUM 20 MG PO TABS
80.0000 mg | ORAL_TABLET | Freq: Every day | ORAL | Status: DC
  Administered 2023-12-20: 80 mg via ORAL
  Filled 2023-12-19: qty 4

## 2023-12-19 MED ORDER — LISINOPRIL 20 MG PO TABS
20.0000 mg | ORAL_TABLET | Freq: Every day | ORAL | Status: DC
  Administered 2023-12-20 – 2023-12-21 (×2): 20 mg via ORAL
  Filled 2023-12-19 (×2): qty 1

## 2023-12-19 MED ORDER — SODIUM CHLORIDE 0.9 % IV SOLN
2.0000 g | INTRAVENOUS | Status: DC
  Administered 2023-12-20: 2 g via INTRAVENOUS
  Filled 2023-12-19: qty 20

## 2023-12-19 MED ORDER — PANTOPRAZOLE SODIUM 40 MG PO TBEC
40.0000 mg | DELAYED_RELEASE_TABLET | Freq: Every day | ORAL | Status: DC
  Administered 2023-12-20 – 2023-12-21 (×2): 40 mg via ORAL
  Filled 2023-12-19 (×2): qty 1

## 2023-12-19 MED ORDER — HYDROCHLOROTHIAZIDE 12.5 MG PO TABS
12.5000 mg | ORAL_TABLET | Freq: Every day | ORAL | Status: DC
  Administered 2023-12-20 – 2023-12-21 (×2): 12.5 mg via ORAL
  Filled 2023-12-19 (×2): qty 1

## 2023-12-19 MED ORDER — LISINOPRIL-HYDROCHLOROTHIAZIDE 20-12.5 MG PO TABS: 1.0000 | ORAL_TABLET | Freq: Every day | ORAL | Status: DC

## 2023-12-19 MED ORDER — ONDANSETRON HCL 4 MG/2ML IJ SOLN
4.0000 mg | Freq: Four times a day (QID) | INTRAMUSCULAR | Status: DC | PRN
  Administered 2023-12-20: 4 mg via INTRAVENOUS
  Filled 2023-12-19: qty 2

## 2023-12-19 NOTE — Consult Note (Addendum)
 Cardiology Consultation   Patient ID: Kelly Barajas MRN: [id number]; DOB: [birth year]  Admit date: 12/19/2023 Date of Consult: 12/19/2023  PCP:  Kelly Lavern CROME, MD   [facility] Providers Cardiologist:  None     Patient Profile: Kelly Barajas is a 73 y.o. female with a hx of hypertension, hepatitis, hyperlipidemia, hypothyroidism, arthritis, osteoporosis, vitamin D  deficiency who is being seen 12/19/2023 for the evaluation of chest pain at the request of Dr. Celinda.  History of Present Illness: Ms. Vasco has past medical history stated above.  She presented to the [facility] emergency department on 12/19/2023 as instructed to due to results of positive blood cultures.  Over the last couple of days she has been seen in various urgent care/PCP offices due to abdominal pain.  She was noted to have a UTI and was started on antibiotics.  They drew blood cultures and contacted her when they came back positive to return to the emergency department.  Relevant workup in the ED/since admission includes: CMP stable, lactic acid normal, CBC normal, blood cultures from prior dates positive for E. Coli, CXR showed no active disease, pending updated echocardiogram, EKG showed sinus rhythm, HR 77, no acute ischemic changes.  She was admitted to the medicine service for further treatment of her UTI.  Cardiology was asked to consult in the setting of chest pain.  After speaking with the patient, she reports that she had one brief episode of chest discomfort while driving to the ED last night, after being told that she had positive blood cultures. She states that it was just brief discomfort, no associated diaphoresis, N/V, shortness of breath. She has been fairly active, in the gym and renovating her Aunt's house, and has not experienced any prior chest pain before this episode, even while maximally exerting herself.  She denies any tobacco use history, alcohol use.  She has a strong family  history of CAD.  And she has a personal history of hyperlipidemia and hypertension that she takes medications for.  She notes that she had previously had an echocardiogram done 09/2022 by her primary care provider as they had noted that she had had a murmur as well.  This prior echo showed normal EF, calcification of aortic valve which was thought to be the reason of the murmur.  Past Medical History:  Diagnosis Date   Allergy    Nickel   Arthritis    Essential hypertension 11/27/2013   GERD (gastroesophageal reflux disease)    Hepatitis    Diagnosed 50 years ago, unsure of which type, but she did have jaundice.   Hyperlipidemia    Hypothyroidism    Osteopenia 08/21/2019   Past Surgical History:  Procedure Laterality Date   DILATION AND CURETTAGE OF UTERUS     DILATION AND CURETTAGE, DIAGNOSTIC / THERAPEUTIC     INJECTION KNEE Right 09/14/2021   Procedure: KNEE INJECTION;  Surgeon: Barbarann Oneil BROCKS, MD;  Location: [facility] OR;  Service: Orthopedics;  Laterality: Right;   KNEE ARTHROSCOPY W/ MENISCAL REPAIR Right 07/16/2017   TONSILLECTOMY     TOTAL KNEE ARTHROPLASTY Left 09/14/2021   Procedure: LEFT TOTAL KNEE ARTHROPLASTY;  Surgeon: Barbarann Oneil BROCKS, MD;  Location: [facility] OR;  Service: Orthopedics;  Laterality: Left;    Home Medications:  Prior to Admission medications   Medication Sig Start Date End Date Taking? Authorizing Provider  Calcium  Citrate-Vitamin D  (CALCIUM  + D PO) Take 1 tablet by mouth 2 (two) times daily.    [provider]  cefadroxil  (DURICEF) 500 MG capsule Take 1 capsule (500 mg total) by mouth 2 (two) times daily for 7 days. 12/17/23 12/24/23  Kommor, Madison, MD  ciprofloxacin  (CIPRO ) 500 MG tablet Take 1 tablet (500 mg total) by mouth 2 (two) times daily for 7 days. 12/18/23 12/25/23  Kelly Lavern CROME, MD  Coenzyme Q10 (COQ10) 100 MG CAPS Take 100 mg by mouth daily.    [provider]  ezetimibe  (ZETIA ) 10 MG tablet Take 1 tablet (10 mg total) by mouth daily. 10/02/23    Kelly Lavern CROME, MD  lisinopril -hydrochlorothiazide  (ZESTORETIC ) 20-12.5 MG tablet Take 1 tablet by mouth once daily 11/23/23   Kelly Lavern CROME, MD  magnesium  gluconate (MAGONATE) 500 MG tablet Take 500 mg by mouth in the morning and at bedtime.    [provider]  Naproxen Sodium 220 MG CAPS Take by mouth in the morning and at bedtime.    [provider]  Omega-3 Fatty Acids (FISH OIL PO) Take 2,000 mg by mouth 2 (two) times daily.    [provider]  omeprazole (PRILOSEC) 20 MG capsule Take 20 mg by mouth daily.    [provider]  OVER THE COUNTER MEDICATION Inhale 1 puff into the lungs daily as needed (congestion). himalayan pink salt inhaler    [provider]  Pitavastatin  Calcium  4 MG TABS Take 1 tablet by mouth once daily 10/19/23   Kelly Lavern CROME, MD  SYNTHROID  88 MCG tablet TAKE 1 TABLET BY MOUTH ONCE DAILY BEFORE BREAKFAST 10/26/23   Kelly Lavern CROME, MD  traMADol  (ULTRAM ) 50 MG tablet Take 1 tablet (50 mg total) by mouth every 6 (six) hours as needed for moderate pain (pain score 4-6). 10/09/23   Kelly Lavern CROME, MD    Scheduled Meds:  cefTRIAXone   500 mg Intramuscular Once   enoxaparin  (LOVENOX ) injection  40 mg Subcutaneous Q24H   Continuous Infusions:  [START ON 12/20/2023] cefTRIAXone  (ROCEPHIN )  IV     PRN Meds: acetaminophen  **OR** acetaminophen , ondansetron  **OR** ondansetron  (ZOFRAN ) IV  Allergies:    Allergies  Allergen Reactions   Nickel Itching   Social History:   Social History   Socioeconomic History   Marital status: Divorced    Spouse name: Not on file   Number of children: 2   Years of education: Not on file   Highest education level: 12th grade  Occupational History   Occupation: retired Engineer, agricultural  Tobacco Use   Smoking status: Never   Smokeless tobacco: Never  Vaping Use   Vaping status: Never Used  Substance and Sexual Activity   Alcohol use: Yes    Comment: socially   Drug use: No    Sexual activity: Never    Birth control/protection: Post-menopausal  Other Topics Concern   Not on file  Social History Narrative   Not on file   Social Drivers of Health   Financial Resource Strain: Low Risk  (07/09/2023)   Overall Financial Resource Strain (CARDIA)    Difficulty of Paying Living Expenses: Not hard at all  Food Insecurity: No Food Insecurity (07/09/2023)   Hunger Vital Sign    Worried About Running Out of Food in the Last Year: Never true    Ran Out of Food in the Last Year: Never true  Transportation Needs: No Transportation Needs (07/09/2023)   PRAPARE - Administrator, Civil Service (Medical): No    Lack of Transportation (Non-Medical): No  Physical Activity: Sufficiently Active (07/09/2023)  Exercise Vital Sign    Days of Exercise per Week: 7 days    Minutes of Exercise per Session: 30 min  Stress: No Stress Concern Present (07/09/2023)   [employer] of Occupational Health - Occupational Stress Questionnaire    Feeling of Stress : Not at all  Social Connections: Moderately Integrated (07/09/2023)   Social Connection and Isolation Panel    Frequency of Communication with Friends and Family: More than three times a week    Frequency of Social Gatherings with Friends and Family: More than three times a week    Attends Religious Services: More than 4 times per year    Active Member of [employer] or Organizations: Yes    Attends Banker Meetings: 1 to 4 times per year    Marital Status: Divorced  Catering manager Violence: Not At Risk (07/09/2023)   Humiliation, Afraid, Rape, and Kick questionnaire    Fear of Current or Ex-Partner: No    Emotionally Abused: No    Physically Abused: No    Sexually Abused: No    Family History:   Family History  Problem Relation Age of Onset   Hyperlipidemia Mother    Diabetes Mellitus II Mother    CAD Mother    Hypertension Mother    Depression Mother    Diabetes Mother    Arthritis Mother     CAD Father    Hyperlipidemia Father    Heart attack Father    Hypertension Sister    Hypertension Brother    Hypothyroidism Daughter    Colon cancer Maternal Grandmother 12   Hypothyroidism Daughter     ROS:  Please see the history of present illness.   All other ROS reviewed and negative.     Physical Exam/Data: Vitals:   12/19/23 0147 12/19/23 0415 12/19/23 0651 12/19/23 1040  BP: 111/74 113/66 109/64 116/66  Pulse: 89 71 62 87  Resp: 18 (!) 22 17 (!) 24  Temp: 98 F (36.7 C)  98.4 F (36.9 C) 98 F (36.7 C)  TempSrc: Oral  Oral   SpO2: 98% 96% 94% 95%  Weight:      Height:        Intake/Output Summary (Last 24 hours) at 12/19/2023 1253 Last data filed at 12/19/2023 0509 Gross per 24 hour  Intake 100 ml  Output --  Net 100 ml      12/19/2023    1:04 AM 12/18/2023    8:42 AM 12/17/2023    7:17 PM  Last 3 Weights  Weight (lbs) 170 lb 170 lb 9.6 oz 173 lb 15.1 oz  Weight (kg) 77.111 kg 77.384 kg 78.9 kg     Body mass index is 30.11 kg/m.   General:  Well nourished, well developed, in no acute distress HEENT: normal Neck: no JVD Vascular: No carotid bruits; Distal pulses 2+ bilaterally Cardiac:  normal S1, S2; RRR; no murmur  Lungs:  clear to auscultation bilaterally, no wheezing, rhonchi or rales  Abd: soft, nontender, no hepatomegaly  Ext: no edema Musculoskeletal:  No deformities, BUE and BLE strength normal and equal Skin: warm and dry  Neuro:  no focal abnormalities noted Psych:  Normal affect   EKG:  The EKG was personally reviewed and demonstrates:  sinus rhythm, HR 77, no acute ischemic changes   Relevant CV Studies: Echocardiogram, 12/19/2023 Ordered pending result  Echocardiogram, 09/20/2022 Left ventricular ejection fraction, by estimation, is 60 to 65% . Left ventricular ejection fraction by 3D volume is 64 % .  The left ventricle has normal function. The left ventricle has no regional wall motion abnormalities. Left ventricular diastolic parameters  are consistent with Grade I diastolic dysfunction ( impaired relaxation) .  Right ventricular systolic function is low normal. The right ventricular size is normal. Tricuspid regurgitation signal is inadequate for assessing PA pressure.  The mitral valve is abnormal. Trivial mitral valve regurgitation.  The aortic valve is calcified. Aortic valve regurgitation is not visualized. Aortic valve sclerosis/ calcification is present, without any evidence of aortic stenosis.  Laboratory Data: High Sensitivity Troponin:  No results for input(s): TROPONINIHS in the last 720 hours.   Chemistry Recent Labs  Lab 12/17/23 1916 12/19/23 0323  NA 136 134*  K 3.9 3.5  CL 100 102  CO2 19* 23  GLUCOSE 145* 118*  BUN 19 15  CREATININE 1.23* 0.93  CALCIUM  10.4* 9.1  GFRNONAA 46* >60  ANIONGAP 17* 9    Recent Labs  Lab 12/19/23 0323  PROT 6.8  ALBUMIN 3.4*  AST 28  ALT 20  ALKPHOS 68  BILITOT 0.7   Lipids No results for input(s): CHOL, TRIG, HDL, LABVLDL, LDLCALC, CHOLHDL in the last 168 hours.  Hematology Recent Labs  Lab 12/17/23 1916 12/19/23 0323  WBC 11.9* 7.7  RBC 4.88 4.23  HGB 14.0 12.1  HCT 43.3 38.0  MCV 88.7 89.8  MCH 28.7 28.6  MCHC 32.3 31.8  RDW 15.8* 15.9*  PLT 288 211   Thyroid  No results for input(s): TSH, FREET4 in the last 168 hours.  BNPNo results for input(s): BNP, PROBNP in the last 168 hours.  DDimer No results for input(s): DDIMER in the last 168 hours.  Radiology/Studies:  DG Chest Port 1 View Result Date: 12/19/2023 CLINICAL DATA:  Question of sepsis to evaluate for abnormality. Urinary tract infection. EXAM: PORTABLE CHEST 1 VIEW COMPARISON:  09/12/2021 FINDINGS: Shallow inspiration. Heart size and pulmonary vascularity are normal for technique. No airspace disease or consolidation in the lungs. No pleural effusion or pneumothorax. Esophageal hiatal hernia behind the heart. IMPRESSION: No active disease. Electronically Signed   By:  Elsie Gravely M.D.   On: 12/19/2023 02:37   Assessment and Plan:  Chest pain Hypertension Hyperlipidemia Presented to [facility] ED complaining of a UTI x 7 days Seen at urgent care, started on antibiotics, instructed to come back due to positive blood cultures Home meds: Zetia  10 mg daily, pitavastatin  4 mg , lisinopril -hydrochlorothiazide  20-12.5 mg daily Only prior ischemic evaluation as an echo stress from 2014 that showed no ischemia, recommended no further cardiac testing Most recent BP 116/66 with HR 87  Patient reported brief episode of chest discomfort while driving to the hospital after being told she has positive blood cultures Denies any chest pain, shortness of breath, lightheadedness, syncope Has not experienced any symptoms while exerting herself recently, is active in the gym/renovating her aunts house Discussed that unless there is something significantly alarming on her echocardiogram she would be okay to follow-up as an outpatient and consider ischemic evaluation if she were to have recurrent symptoms at that time, patient prefers this plan  She wishes to follow up as outpatient once discharged to re-establish care and possibly discuss noninvasive ischemic evaluation at that time if she were to have recurrent symptoms   Per primary Bacteremia Acute cystitis GERD Osteoarthritis Hypothyroidism Vitamin D  deficiency Hepatitis  For questions or updates, please contact [facility] Please consult [email or web address] for contact info under    Signed, Kelly DELENA Donath, PA-C  12/19/2023  12:53 PM   History and all data above reviewed.  I personally took the history today, performed the physical exam and formulated the assessment and plan.  I reviewed all relevant tests and studies. Patient examined.  I agree with the findings as above. She has had a blood culture positive for e. Coli and has had UTI.  She has no past cardiac history other than a murmur with aortic  sclerosis last year.  She is very active.  Exercises routinely.  The patient denies any new symptoms such as neck or arm discomfort. There has been no new shortness of breath, PND or orthopnea.  She reported some chest discomfort that was left of sternum yesterday evening while she was driving to the ED.  This was mild without associated symptoms.  No enzymes in the ED and EKG normal.  There have been no reported palpitations, presyncope or syncope.   The patient exam reveals COR:RRR , 2/6 short apical systolic murmur, no diastolic.  Consistent with echo last year.  ,  Lungs: Clear  ,  Abd: Positive bowel sounds, no rebound no guarding, Ext No edema   .  All available labs, radiology testing, previous records reviewed. Agree with documented assessment and plan. Murmur:  No further echo needed.  Aortic sclerosis.  Chest pain:  Non anginal and no further work up needed.  Dyslipidemia:  Not at target LDL with her family history.  Current statin not adequate and also on Zetia .  I will change her to Repatha as an out patient and follow up with repeat lipids and LPa.   Lynwood Kartel Wolbert  2:44 PM  12/19/2023

## 2023-12-19 NOTE — ED Provider Notes (Signed)
 [place] EMERGENCY DEPARTMENT AT [facility] Provider Note   CSN: [id number] Arrival date & time: 12/19/23  9961     Patient presents with: Abdominal Pain   Kelly Barajas is a 73 y.o. female.  Patient with hyperlipidemia, GERD, hypertension, hypothyroidism presents to emergency room with complaint of UTI.  Patient reports for approximately last 7 days she has had urgency frequency and dysuria.  She was seen at [facility] 1 day ago for this and started on Keflex  and has also since been started on Cipro  by primary care.  She comes in today with complaint of positive blood cultures.  Reports she has started to feel mildly better however over the last 2 days has had chills and rigors.  No resulted urine culture yet.  Does not think she has had a fever today but has taken Tylenol  within the past 6 hours.    Abdominal Pain      Prior to Admission medications   Medication Sig Start Date End Date Taking? Authorizing Provider  Calcium  Citrate-Vitamin D  (CALCIUM  + D PO) Take 1 tablet by mouth 2 (two) times daily.    [provider]  cefadroxil  (DURICEF) 500 MG capsule Take 1 capsule (500 mg total) by mouth 2 (two) times daily for 7 days. 12/17/23 12/24/23  Kommor, Madison, MD  ciprofloxacin  (CIPRO ) 500 MG tablet Take 1 tablet (500 mg total) by mouth 2 (two) times daily for 7 days. 12/18/23 12/25/23  [name], MD  Coenzyme Q10 (COQ10) 100 MG CAPS Take 100 mg by mouth daily.    [provider]  ezetimibe  (ZETIA ) 10 MG tablet Take 1 tablet (10 mg total) by mouth daily. 10/02/23   [name], MD  lisinopril -hydrochlorothiazide  (ZESTORETIC ) 20-12.5 MG tablet Take 1 tablet by mouth once daily 11/23/23   [name], MD  magnesium  gluconate (MAGONATE) 500 MG tablet Take 500 mg by mouth in the morning and at bedtime.    [provider]  Naproxen Sodium 220 MG CAPS Take by mouth in the morning and at bedtime.    [provider]  Omega-3 Fatty  Acids (FISH OIL PO) Take 2,000 mg by mouth 2 (two) times daily.    [provider]  omeprazole (PRILOSEC) 20 MG capsule Take 20 mg by mouth daily.    [provider]  OVER THE COUNTER MEDICATION Inhale 1 puff into the lungs daily as needed (congestion). himalayan pink salt inhaler    [provider]  Pitavastatin  Calcium  4 MG TABS Take 1 tablet by mouth once daily 10/19/23   [name], MD  SYNTHROID  88 MCG tablet TAKE 1 TABLET BY MOUTH ONCE DAILY BEFORE BREAKFAST 10/26/23   [name], MD  traMADol  (ULTRAM ) 50 MG tablet Take 1 tablet (50 mg total) by mouth every 6 (six) hours as needed for moderate pain (pain score 4-6). 10/09/23   [name], MD    Allergies: Nickel    Review of Systems  Gastrointestinal:  Positive for abdominal pain.    Updated Vital Signs BP 111/74 (BP Location: Left Arm)   Pulse 89   Temp 98 F (36.7 C) (Oral)   Resp 18   Ht 5' 3 (1.6 m)   Wt 77.1 kg   SpO2 98%   BMI 30.11 kg/m   Physical Exam Vitals and nursing note reviewed.  Constitutional:      General: She is not in acute distress.    Appearance: She is not toxic-appearing.  HENT:     Head: Normocephalic and atraumatic.  Eyes:     General: No scleral icterus.    Conjunctiva/sclera: Conjunctivae normal.  Cardiovascular:     Rate and Rhythm: Normal rate and regular rhythm.     Pulses: Normal pulses.     Heart sounds: Normal heart sounds.  Pulmonary:     Effort: Pulmonary effort is normal. No respiratory distress.     Breath sounds: Normal breath sounds.  Abdominal:     General: Abdomen is flat. Bowel sounds are normal.     Palpations: Abdomen is soft.     Tenderness: There is no abdominal tenderness.  Skin:    General: Skin is warm and dry.     Findings: No lesion.  Neurological:     General: No focal deficit present.     Mental Status: She is alert and oriented to person, place, and time. Mental status is at baseline.     (all labs ordered are  listed, but only abnormal results are displayed) Labs Reviewed  URINALYSIS, W/ REFLEX TO CULTURE (INFECTION SUSPECTED) - Abnormal; Notable for the following components:      Result Value   Leukocytes,Ua SMALL (*)    Bacteria, UA FEW (*)    All other components within normal limits  CULTURE, BLOOD (ROUTINE X 2)  CULTURE, BLOOD (ROUTINE X 2)  URINE CULTURE  COMPREHENSIVE METABOLIC PANEL WITH GFR  CBC WITH DIFFERENTIAL/PLATELET  PROTIME-INR  I-STAT CG4 LACTIC ACID, ED    EKG: EKG Interpretation Date/Time:  Wednesday December 19 2023 02:56:49 EDT Ventricular Rate:  77 PR Interval:  193 QRS Duration:  79 QT Interval:  371 QTC Calculation: 420 R Axis:   45  Text Interpretation: Sinus rhythm Low voltage, precordial leads RSR' in V1 or V2, probably normal variant Borderline T abnormalities, anterior leads Confirmed by Griselda Norris [phone]) on 12/19/2023 3:01:44 AM  Radiology: [facility] Chest Port 1 View Result Date: 12/19/2023 CLINICAL DATA:  Question of sepsis to evaluate for abnormality. Urinary tract infection. EXAM: PORTABLE CHEST 1 VIEW COMPARISON:  09/12/2021 FINDINGS: Shallow inspiration. Heart size and pulmonary vascularity are normal for technique. No airspace disease or consolidation in the lungs. No pleural effusion or pneumothorax. Esophageal hiatal hernia behind the heart. IMPRESSION: No active disease. Electronically Signed   By: Elsie Gravely M.D.   On: 12/19/2023 02:37     Procedures   Medications Ordered in the ED  cefTRIAXone  (ROCEPHIN ) 2 g in sodium chloride  0.9 % 100 mL IVPB (has no administration in time range)                                    Medical Decision Making Amount and/or Complexity of Data Reviewed Labs: ordered. Radiology: ordered.  Risk Decision regarding hospitalization.   This patient presents to the ED for concern of UTI/bacteriemia, this involves an extensive number of treatment options, and is a complaint that carries with it a high risk of  complications and morbidity.  The differential diagnosis includes UTI, sepsis, URI, electrolyte abnormality   Additional history obtained:  Additional history obtained from  Recent ED visit 12/17/2023   Lab Tests:  I personally interpreted labs.  The pertinent results include:   CBC without leukocytosis  Cultures repeated and pending UA with small leukocytes mildly improved.  Will send for urine culture.   Imaging Studies ordered:  I ordered imaging studies including chest x-ray ordered for sepsis  rule out I independently visualized and interpreted imaging which showed no acute finding I agree with the radiologist interpretation   Cardiac Monitoring: / EKG:  The patient was maintained on a cardiac monitor.    Consultations Obtained:  I requested consultation with the hospitalist,  and discussed lab and imaging findings as well as pertinent plan - they recommend: admit, agree to see patient    Problem List / ED Course / Critical interventions / Medication management  Patient reports to emergency room with complaint of positive blood culture.  She has been having urinary symptoms over the past 7 days.  Over the last 2 days has started having chills and rigors.  No documented temperature.  She was seen at [facility] she has been 2 days ago. Found to be tachycardic with white count.  Blood cultures were drawn and they have come back positive.  She is starting to feel mildly improved but still does have urinary symptoms.  Given positive blood cultures and continued symptoms we will seek admission.  Blood cultures were redrawn.  Lactate is not elevated.  She was originally tachycardic on arrival but this has improved as well.  I ordered medication including Rocephin  Reevaluation of the patient after these medicines showed that the patient improved I have reviewed the patients home medicines and have made adjustments as needed       Final diagnoses:  Bacteremia  Acute cystitis  with hematuria    ED Discharge Orders     None          Shermon Warren SAILOR, PA-C 12/19/23 0444    Griselda Norris, MD 12/19/23 [phone]

## 2023-12-19 NOTE — Hospital Course (Signed)
 73-yo f mhx of hyperlipidemia, hypothyroidism, essential hypertension, gout, overactive bladder, left knee arthroplasty and chronic osteoarthritis emergency department complaining of right-sided flank pain, chill and dysuria for 7 days.  Patient initially seen in the ED 2 days ago during that time blood cultures has been obtained, received IV Rocephin  and sent home with oral Keflex .  Again presenting today with complaining of chills and continue to have right-sided flank pain.  Blood culture  from 7/7 also grew E. coli.  At presentation to ED patient found tachycardic 102 which has been rated 9.  Otherwise hemodynamically stable. UA showed evidence of UTI.  Urine culture in process. CMP showing slightly low sodium 134 otherwise unremarkable.  CBC no evidence of leukocytosis stable H&H normal platelet count.  Pending lactic acid level.  Blood cultures are in process. Chest x-ray no active cardiopulmonary disease process.  In the ED patient received ceftriaxone  2 g.  Hospitalist has been consulted for further management of E. coli bacteremia and pyelonephritis.

## 2023-12-19 NOTE — Plan of Care (Signed)
  Problem: Education: Goal: Knowledge of General Education information will improve Description: Including pain rating scale, medication(s)/side effects and non-pharmacologic comfort measures Outcome: Progressing   Problem: Elimination: Goal: Will not experience complications related to bowel motility Outcome: Progressing Goal: Will not experience complications related to urinary retention Outcome: Progressing   Problem: Pain Managment: Goal: General experience of comfort will improve and/or be controlled Outcome: Progressing   Problem: Safety: Goal: Ability to remain free from injury will improve Outcome: Progressing

## 2023-12-19 NOTE — H&P (Signed)
 History and Physical    Patient: Kelly Barajas FMW:[id number] DOB: 27-May-1951 DOA: 12/19/2023 DOS: the patient was seen and examined on 12/19/2023 PCP: [name], MD  Patient coming from: Home  Chief Complaint:  Chief Complaint  Patient presents with   Abdominal Pain   HPI: Kelly Barajas is a 73 y.o. female with medical history significant of medical allergies, osteoarthritis, essential hypertension, GERD, hepatitis, hyperlipidemia, hypothyroidism, osteopenia, vitamin D  deficiency who presented to the emergency department due to positive blood culture results with E. coli.  She has been treated for an UTI since last week.  Her dysuria and frequency symptoms have improved.   No abdominal pain, nausea, emesis, diarrhea, constipation, melena or hematochezia.  No flank pain or hematuria. She thinks she had episodes of fever last week.  She denied rhinorrhea, sore throat, wheezing or hemoptysis.  Positive precordial chest pain last night, nonradiated and lasted for about 15 minutes, but no palpitations, diaphoresis, PND, orthopnea or pitting edema of the lower extremities.  No polyuria, polydipsia, polyphagia or blurred vision.   Lab work: Urinalysis shows small leukocyte esterase, 21-50 WBC and a few bacteria.  Normal PT and INR.  CBC showed white count 7.7, hemoglobin 12.1 g/dL platelets 788.  Lactic acid was normal.  CMP showed sodium of 134 mmol/L, glucose 218 mg/dL and albumin of 3.4 g/dL.  The rest of the CMP measurements were normal.  Imaging: Portable 1 view chest radiograph with no active disease.  ED course: Initial vital signs were temperature 98.3 F, pulse 78, respiration 18, BP 124/78 mmHg O2 sat 96% on room air strength.  The patient received ceftriaxone  2 g IVPB x 2.   Review of Systems: As mentioned in the history of present illness. All other systems reviewed and are negative.  Past Medical History:  Diagnosis Date   Allergy    Nickel   Arthritis    Essential  hypertension 11/27/2013   GERD (gastroesophageal reflux disease)    Hepatitis    Diagnosed 50 years ago, unsure of which type, but she did have jaundice.   Hyperlipidemia    Hypothyroidism    Osteopenia 08/21/2019   Past Surgical History:  Procedure Laterality Date   DILATION AND CURETTAGE OF UTERUS     DILATION AND CURETTAGE, DIAGNOSTIC / THERAPEUTIC     INJECTION KNEE Right 09/14/2021   Procedure: KNEE INJECTION;  Surgeon: Barbarann Oneil BROCKS, MD;  Location: [facility] OR;  Service: Orthopedics;  Laterality: Right;   KNEE ARTHROSCOPY W/ MENISCAL REPAIR Right 07/16/2017   TONSILLECTOMY     TOTAL KNEE ARTHROPLASTY Left 09/14/2021   Procedure: LEFT TOTAL KNEE ARTHROPLASTY;  Surgeon: Barbarann Oneil BROCKS, MD;  Location: [facility] OR;  Service: Orthopedics;  Laterality: Left;   Social History:  reports that she has never smoked. She has never used smokeless tobacco. She reports current alcohol use. She reports that she does not use drugs.  Allergies  Allergen Reactions   Nickel Itching    Family History  Problem Relation Age of Onset   Hyperlipidemia Mother    Diabetes Mellitus II Mother    CAD Mother    Hypertension Mother    Depression Mother    Diabetes Mother    Arthritis Mother    CAD Father    Hyperlipidemia Father    Heart attack Father    Hypertension Sister    Hypertension Brother    Hypothyroidism Daughter    Colon cancer Maternal Grandmother 9   Hypothyroidism Daughter  Prior to Admission medications   Medication Sig Start Date End Date Taking? Authorizing Provider  Calcium  Citrate-Vitamin D  (CALCIUM  + D PO) Take 1 tablet by mouth 2 (two) times daily.    [provider]  cefadroxil  (DURICEF) 500 MG capsule Take 1 capsule (500 mg total) by mouth 2 (two) times daily for 7 days. 12/17/23 12/24/23  Kommor, Madison, MD  ciprofloxacin  (CIPRO ) 500 MG tablet Take 1 tablet (500 mg total) by mouth 2 (two) times daily for 7 days. 12/18/23 12/25/23  [name], MD  Coenzyme Q10 (COQ10) 100  MG CAPS Take 100 mg by mouth daily.    [provider]  ezetimibe  (ZETIA ) 10 MG tablet Take 1 tablet (10 mg total) by mouth daily. 10/02/23   [name], MD  lisinopril -hydrochlorothiazide  (ZESTORETIC ) 20-12.5 MG tablet Take 1 tablet by mouth once daily 11/23/23   [name], MD  magnesium  gluconate (MAGONATE) 500 MG tablet Take 500 mg by mouth in the morning and at bedtime.    [provider]  Naproxen Sodium 220 MG CAPS Take by mouth in the morning and at bedtime.    [provider]  Omega-3 Fatty Acids (FISH OIL PO) Take 2,000 mg by mouth 2 (two) times daily.    [provider]  omeprazole (PRILOSEC) 20 MG capsule Take 20 mg by mouth daily.    [provider]  OVER THE COUNTER MEDICATION Inhale 1 puff into the lungs daily as needed (congestion). himalayan pink salt inhaler    [provider]  Pitavastatin  Calcium  4 MG TABS Take 1 tablet by mouth once daily 10/19/23   [name], MD  SYNTHROID  88 MCG tablet TAKE 1 TABLET BY MOUTH ONCE DAILY BEFORE BREAKFAST 10/26/23   [name], MD  traMADol  (ULTRAM ) 50 MG tablet Take 1 tablet (50 mg total) by mouth every 6 (six) hours as needed for moderate pain (pain score 4-6). 10/09/23   [name], MD    Physical Exam: Vitals:   12/19/23 0104 12/19/23 0147 12/19/23 0415 12/19/23 0651  BP:  111/74 113/66 109/64  Pulse:  89 71 62  Resp:  18 (!) 22 17  Temp:  98 F (36.7 C)  98.4 F (36.9 C)  TempSrc:  Oral  Oral  SpO2: 98% 98% 96% 94%  Weight: 77.1 kg     Height: 5' 3 (1.6 m)      Physical Exam Vitals and nursing note reviewed.  Constitutional:      General: She is awake. She is not in acute distress.    Appearance: She is obese. She is ill-appearing.  HENT:     Head: Normocephalic.     Nose: No rhinorrhea.     Mouth/Throat:     Mouth: Mucous membranes are dry.  Eyes:     General: No scleral icterus.    Pupils: Pupils are equal, round, and reactive to light.  Neck:      Vascular: No JVD.  Cardiovascular:     Rate and Rhythm: Normal rate and regular rhythm.     Heart sounds: S1 normal and S2 normal.  Pulmonary:     Effort: Pulmonary effort is normal.     Breath sounds: Normal breath sounds.  Abdominal:     General: Bowel sounds are normal. There is no distension.     Palpations: Abdomen is soft.     Tenderness: There is no abdominal tenderness. There is no right CVA tenderness or left CVA tenderness.  Musculoskeletal:     Cervical back: Neck supple.     Right lower leg: No edema.     Left lower leg: No edema.  Skin:    General: Skin is warm and dry.  Neurological:     General: No focal deficit present.     Mental Status: She is alert and oriented to person, place, and time.  Psychiatric:        Mood and Affect: Mood normal.        Behavior: Behavior normal. Behavior is cooperative.     Data Reviewed:  Results are pending, will review when available. 09/20/2022 echocardiogram report. IMPRESSIONS:    1. Left ventricular ejection fraction, by estimation, is 60 to 65%. Left  ventricular ejection fraction by 3D volume is 64 %. The left ventricle has  normal function. The left ventricle has no regional wall motion  abnormalities. Left ventricular diastolic   parameters are consistent with Grade I diastolic dysfunction (impaired  relaxation).   2. Right ventricular systolic function is low normal. The right  ventricular size is normal. Tricuspid regurgitation signal is inadequate  for assessing PA pressure.   3. The mitral valve is abnormal. Trivial mitral valve regurgitation.   4. The aortic valve is calcified. Aortic valve regurgitation is not  visualized. Aortic valve sclerosis/calcification is present, without any  evidence of aortic stenosis.  EKG: Vent. rate 77 BPM PR interval 193 ms QRS duration 79 ms QT/QTcB 371/420 ms P-R-T axes 44 45 3 Sinus rhythm Low voltage, precordial leads RSR' in V1 or V2, probably normal  variant Borderline T abnormalities, anterior leads  Assessment and Plan: Principal Problem:   E coli bacteremia Admit to MedSurg/inpatient. Continue ceftriaxone  2 g IVPB daily. Follow-up blood culture and sensitivity. Follow-up CBC and chemistry in the morning. Check echocardiogram.  Active Problems:   Chest pain  Cardiology consult appreciated.   -If she remains chest pain-free will go for outpatient evaluation.    Grade I diastolic dysfunction Continue lisinopril  20 mg p.o. daily. Continue hydrochlorothiazide  12.5 mg p.o. daily.    Essential hypertension Resume lisinopril  with hydrochlorothiazide  in AM.    Mixed hyperlipidemia Continue pravastatin  or formulary equivalent. Continue ezetimibe  with 10 mg p.o. daily.    Hypothyroidism Continue levothyroxine  88 mcg p.o. daily.    Gastroesophageal reflux disease without esophagitis Continue home PPI or formulary equivalent.     Advance Care Planning:   Code Status: Full Code   Consults: Cardiology Sydnee Schilling, MD).  Family Communication:   Severity of Illness: The appropriate patient status for this patient is INPATIENT. Inpatient status is judged to be reasonable and necessary in order to provide the required intensity of service to ensure the patient's safety. The patient's presenting symptoms, physical exam findings, and initial radiographic and laboratory data in the context of their chronic comorbidities is felt to place them at high risk for further clinical deterioration. Furthermore, it is not anticipated that the patient will be medically stable for discharge from the hospital within 2 midnights of admission.   * I certify that at the point of admission it is my clinical judgment that the patient will require inpatient hospital care spanning beyond 2 midnights from the point of admission due to high intensity of service, high risk for further deterioration and high frequency of surveillance  required.*  Author: Alm Dorn Castor, MD 12/19/2023 7:44 AM  For on call review [email or web address].   This document was prepared using Dragon voice recognition software and may contain some unintended  transcription errors.

## 2023-12-19 NOTE — ED Triage Notes (Signed)
 Was seen at [facility] for a uti last night. Was called tonight and was told to come to the hospital today due to culture results.

## 2023-12-20 ENCOUNTER — Inpatient Hospital Stay (HOSPITAL_COMMUNITY)

## 2023-12-20 DIAGNOSIS — E039 Hypothyroidism, unspecified: Secondary | ICD-10-CM

## 2023-12-20 DIAGNOSIS — R079 Chest pain, unspecified: Secondary | ICD-10-CM

## 2023-12-20 DIAGNOSIS — I10 Essential (primary) hypertension: Secondary | ICD-10-CM | POA: Diagnosis not present

## 2023-12-20 DIAGNOSIS — E782 Mixed hyperlipidemia: Secondary | ICD-10-CM

## 2023-12-20 DIAGNOSIS — K219 Gastro-esophageal reflux disease without esophagitis: Secondary | ICD-10-CM | POA: Diagnosis not present

## 2023-12-20 DIAGNOSIS — R7881 Bacteremia: Secondary | ICD-10-CM | POA: Diagnosis not present

## 2023-12-20 LAB — COMPREHENSIVE METABOLIC PANEL WITH GFR
ALT: 20 U/L (ref 0–44)
AST: 23 U/L (ref 15–41)
Albumin: 3.4 g/dL — ABNORMAL LOW (ref 3.5–5.0)
Alkaline Phosphatase: 66 U/L (ref 38–126)
Anion gap: 10 (ref 5–15)
BUN: 13 mg/dL (ref 8–23)
CO2: 22 mmol/L (ref 22–32)
Calcium: 8.9 mg/dL (ref 8.9–10.3)
Chloride: 98 mmol/L (ref 98–111)
Creatinine, Ser: 0.81 mg/dL (ref 0.44–1.00)
GFR, Estimated: >60 mL/min (ref >60)
Glucose, Bld: 112 mg/dL — ABNORMAL HIGH (ref 70–99)
Potassium: 3.7 mmol/L (ref 3.5–5.1)
Sodium: 130 mmol/L — ABNORMAL LOW (ref 135–145)
Total Bilirubin: 0.7 mg/dL (ref 0.0–1.2)
Total Protein: 6.8 g/dL (ref 6.5–8.1)

## 2023-12-20 LAB — CBC
HCT: 37.7 % (ref 36.0–46.0)
Hemoglobin: 12 g/dL (ref 12.0–15.0)
MCH: 28.8 pg (ref 26.0–34.0)
MCHC: 31.8 g/dL (ref 30.0–36.0)
MCV: 90.6 fL (ref 80.0–100.0)
Platelets: 207 K/uL (ref 150–400)
RBC: 4.16 MIL/uL (ref 3.87–5.11)
RDW: 15.9 % — ABNORMAL HIGH (ref 11.5–15.5)
WBC: 6.7 K/uL (ref 4.0–10.5)
nRBC: 0 % (ref 0.0–0.2)

## 2023-12-20 LAB — URINE CULTURE
Culture: 70000 — AB
Culture: NO GROWTH

## 2023-12-20 LAB — CULTURE, BLOOD (SINGLE)

## 2023-12-20 MED ORDER — CEFADROXIL 500 MG PO CAPS
1000.0000 mg | ORAL_CAPSULE | Freq: Two times a day (BID) | ORAL | Status: DC
  Administered 2023-12-20 – 2023-12-21 (×2): 1000 mg via ORAL
  Filled 2023-12-20 (×2): qty 2

## 2023-12-20 NOTE — Progress Notes (Signed)
   12/20/23 1202  TOC Brief Assessment  Insurance and Status Reviewed  Patient has primary care physician Yes  Home environment has been reviewed resides in private residence  Prior level of function: Independent  Prior/Current Home Services No current home services  Social Drivers of Health Review SDOH reviewed no interventions necessary  Readmission risk has been reviewed Yes  Transition of care needs no transition of care needs at this time

## 2023-12-20 NOTE — Progress Notes (Addendum)
  Progress Note  Patient Name: Kelly Barajas Date of Encounter: 12/20/2023 [facility] Cardiologist: None   Interval Summary   No cardiac complaints today Hopefully going home tomorrow Plans to start Repatha as an outpatient   Vital Signs Vitals:   12/19/23 1356 12/19/23 2042 12/20/23 0155 12/20/23 0549  BP: 122/67 115/61 117/67 112/66  Pulse: 84 72 79 70  Resp: 18 18 18 18   Temp:  (!) 97.4 F (36.3 C) 99.5 F (37.5 C) 97.9 F (36.6 C)  TempSrc:  Oral Oral Oral  SpO2: 95% 97% 93% 94%  Weight:      Height:        Intake/Output Summary (Last 24 hours) at 12/20/2023 1034 Last data filed at 12/20/2023 0600 Gross per 24 hour  Intake 937.76 ml  Output 1100 ml  Net -162.24 ml      12/19/2023    1:04 AM 12/18/2023    8:42 AM 12/17/2023    7:17 PM  Last 3 Weights  Weight (lbs) 170 lb 170 lb 9.6 oz 173 lb 15.1 oz  Weight (kg) 77.111 kg 77.384 kg 78.9 kg      Telemetry/ECG  N/A - Personally Reviewed  Physical Exam  GEN: No acute distress.   Neck: No JVD Cardiac: RRR, soft murmur.  Respiratory: Clear to auscultation bilaterally. GI: Soft, nontender, non-distended  MS: No edema  Assessment & Plan  73 y.o. female with a hx of hypertension, hepatitis, hyperlipidemia, hypothyroidism, arthritis, osteoporosis, vitamin D  deficiency who is being seen for chest pain   Chest pain Hypertension Hyperlipidemia Presented to [facility] ED complaining of a UTI x 7 days Seen at urgent care, started on antibiotics, instructed to come back due to positive blood cultures Home meds: Zetia  10 mg daily, pitavastatin  4 mg , lisinopril -hydrochlorothiazide  20-12.5 mg daily Only prior ischemic evaluation as an echo stress from 2014 that showed no ischemia, recommended no further cardiac testing Most recent BP 112/66 with HR 70 Patient reported brief episode of chest discomfort while driving to the hospital after being told she has positive blood cultures Denies any chest pain,  shortness of breath, lightheadedness, syncope Has not experienced any symptoms while exerting herself recently, is active in the gym/renovating her aunts house Her chest pain appears non-cardiac and therefore would not recommend any further workup  She wishes to follow up as outpatient once discharged to re-establish care and possibly discuss noninvasive ischemic evaluation at that time if she were to have recurrent symptoms  Plans to change to Repatha as an outpatient  Canceled order for updated echo per MD   Per primary Bacteremia Acute cystitis GERD Osteoarthritis Hypothyroidism Vitamin D  deficiency Hepatitis   For questions or updates, please contact [facility] Please consult [email or web address] for contact info under       Signed, [place] LABOR Parcells, PA-C   No further work up.  We will arrange follow up for management of lipids and to make sure there has been no further chest pain.   Kelly Barajas  11:25 AM  12/20/2023

## 2023-12-20 NOTE — Progress Notes (Signed)
 2D echo attempted, patient stated Dr. Lavona told her he was canceling the echo yesterday.

## 2023-12-20 NOTE — Progress Notes (Signed)
 [facility]                                                                               Kelly Barajas, is a 73 y.o. female, DOB - [birth year], FMW:[id number] Admit date - 12/19/2023    Outpatient Primary MD for the patient is [name], MD  LOS - 1  days    Brief summary   73-yo f mhx of hyperlipidemia, hypothyroidism, essential hypertension, gout, overactive bladder, left knee arthroplasty and chronic osteoarthritis emergency department complaining of right-sided flank pain, chill and dysuria for 7 days.  Patient initially seen in the ED 2 days ago during that time blood cultures has been obtained, received IV Rocephin  and sent home with oral Keflex .  Again presenting today with complaining of chills and continue to have right-sided flank pain.  Blood culture  from 7/7 also grew E. coli.  At presentation to ED patient found tachycardic 102 which has been rated 9.  Otherwise hemodynamically stable. UA showed evidence of UTI.  Urine culture in process. CMP showing slightly low sodium 134 otherwise unremarkable.  CBC no evidence of leukocytosis stable H&H normal platelet count.  Pending lactic acid level.  Blood cultures are in process. Chest x-ray no active cardiopulmonary disease process.  In the ED patient received ceftriaxone  2 g.  Hospitalist has been consulted for further management of E. coli bacteremia and pyelonephritis.     Assessment & Plan    Assessment and Plan:  E coli bacteremia from Acute pyelonephritis:  Continue with IV ceftriaxone  2g daily.  Follow up with blood cultures and sensitivity.   Chest pain on admission.  Resolved.  Cardiology consulted and recommended outpatient evaluation.    Hypertension Continue with lisinopril  and hydrochlorothiazide .    Hyperlipidemia Resume statin and zetia .    Hypothyroidism:  Resume synthroid  88 mcg daily.    GERD  Stable.    Estimated body mass index is 30.11 kg/m as calculated from  the following:   Height as of this encounter: 5' 3 (1.6 m).   Weight as of this encounter: 77.1 kg.  Code Status: full code.  DVT Prophylaxis:  enoxaparin  (LOVENOX ) injection 40 mg Start: 12/19/23 2200   Level of Care: Level of care: Med-Surg Family Communication: none at bedside.   Disposition Plan:     Remains inpatient appropriate:  pending   Procedures:  None.   Consultants:   cardiology  Antimicrobials:   Anti-infectives (From admission, onward)    Start     Dose/Rate Route Frequency Ordered Stop   12/20/23 0600  cefTRIAXone  (ROCEPHIN ) 2 g in sodium chloride  0.9 % 100 mL IVPB        2 g 200 mL/hr over 30 Minutes Intravenous Every 24 hours 12/19/23 0816     12/19/23 0230  cefTRIAXone  (ROCEPHIN ) 2 g in sodium chloride  0.9 % 100 mL IVPB        2 g 200 mL/hr over 30 Minutes Intravenous  Once 12/19/23 0216 12/19/23 0509        Medications  Scheduled Meds:  enoxaparin  (LOVENOX ) injection  40 mg Subcutaneous Q24H   ezetimibe   10 mg Oral Daily   hydrochlorothiazide   12.5  mg Oral Daily   levothyroxine   88 mcg Oral Q0600   lisinopril   20 mg Oral Daily   pantoprazole   40 mg Oral Daily   pravastatin   80 mg Oral q1800   Continuous Infusions:  cefTRIAXone  (ROCEPHIN )  IV 2 g (12/20/23 0530)   PRN Meds:.acetaminophen  **OR** acetaminophen , ondansetron  **OR** ondansetron  (ZOFRAN ) IV    Subjective:   Kelly Barajas was seen and examined today.  No new complaints.   Objective:   Vitals:   12/19/23 1356 12/19/23 2042 12/20/23 0155 12/20/23 0549  BP: 122/67 115/61 117/67 112/66  Pulse: 84 72 79 70  Resp: 18 18 18 18   Temp:  (!) 97.4 F (36.3 C) 99.5 F (37.5 C) 97.9 F (36.6 C)  TempSrc:  Oral Oral Oral  SpO2: 95% 97% 93% 94%  Weight:      Height:        Intake/Output Summary (Last 24 hours) at 12/20/2023 1156 Last data filed at 12/20/2023 0600 Gross per 24 hour  Intake 937.76 ml  Output 1100 ml  Net -162.24 ml   Filed Weights   12/19/23 0104   Weight: 77.1 kg     Exam General: Alert and oriented x 3, NAD Cardiovascular: S1 S2 auscultated, no murmurs, RRR Respiratory: Clear to auscultation bilaterally, no wheezing, rales or rhonchi Gastrointestinal: Soft, nontender, nondistended, + bowel sounds Ext: no pedal edema bilaterally Neuro: AAOx3, Cr N's II- XII. Strength 5/5 upper and lower extremities bilaterally Skin: No rashes Psych: Normal affect and demeanor, alert and oriented x3    Data Reviewed:  I have personally reviewed following labs and imaging studies   CBC Lab Results  Component Value Date   WBC 6.7 12/20/2023   RBC 4.16 12/20/2023   HGB 12.0 12/20/2023   HCT 37.7 12/20/2023   MCV 90.6 12/20/2023   MCH 28.8 12/20/2023   PLT 207 12/20/2023   MCHC 31.8 12/20/2023   RDW 15.9 (H) 12/20/2023   LYMPHSABS 0.7 12/19/2023   MONOABS 1.0 12/19/2023   EOSABS 0.0 12/19/2023   BASOSABS 0.0 12/19/2023     Last metabolic panel Lab Results  Component Value Date   NA 130 (L) 12/20/2023   K 3.7 12/20/2023   CL 98 12/20/2023   CO2 22 12/20/2023   BUN 13 12/20/2023   CREATININE 0.81 12/20/2023   GLUCOSE 112 (H) 12/20/2023   GFRNONAA >60 12/20/2023   GFRAA 81 (L) 09/22/2011   CALCIUM  8.9 12/20/2023   PROT 6.8 12/20/2023   ALBUMIN 3.4 (L) 12/20/2023   BILITOT 0.7 12/20/2023   ALKPHOS 66 12/20/2023   AST 23 12/20/2023   ALT 20 12/20/2023   ANIONGAP 10 12/20/2023    CBG (last 3)  No results for input(s): GLUCAP in the last 72 hours.    Coagulation Profile: Recent Labs  Lab 12/19/23 0323  INR 1.0     Radiology Studies: [facility] 1 View Result Date: 12/19/2023 CLINICAL DATA:  Question of sepsis to evaluate for abnormality. Urinary tract infection. EXAM: PORTABLE CHEST 1 VIEW COMPARISON:  09/12/2021 FINDINGS: Shallow inspiration. Heart size and pulmonary vascularity are normal for technique. No airspace disease or consolidation in the lungs. No pleural effusion or pneumothorax. Esophageal hiatal  hernia behind the heart. IMPRESSION: No active disease. Electronically Signed   By: Elsie Gravely M.D.   On: 12/19/2023 02:37       Elgie Butter M.D. [facility] 12/20/2023, 11:56 AM  Available via Epic secure chat 7am-7pm After 7 pm, please refer to night coverage provider  listed on amion.

## 2023-12-21 ENCOUNTER — Other Ambulatory Visit (HOSPITAL_COMMUNITY): Payer: Self-pay

## 2023-12-21 ENCOUNTER — Telehealth (HOSPITAL_BASED_OUTPATIENT_CLINIC_OR_DEPARTMENT_OTHER): Payer: Self-pay

## 2023-12-21 DIAGNOSIS — K219 Gastro-esophageal reflux disease without esophagitis: Secondary | ICD-10-CM | POA: Diagnosis not present

## 2023-12-21 DIAGNOSIS — N3001 Acute cystitis with hematuria: Secondary | ICD-10-CM

## 2023-12-21 DIAGNOSIS — R7881 Bacteremia: Secondary | ICD-10-CM | POA: Diagnosis not present

## 2023-12-21 DIAGNOSIS — I10 Essential (primary) hypertension: Secondary | ICD-10-CM | POA: Diagnosis not present

## 2023-12-21 MED ORDER — CEFADROXIL 500 MG PO CAPS: 1000.0000 mg | ORAL_CAPSULE | Freq: Two times a day (BID) | ORAL | 0 refills | Status: AC

## 2023-12-21 MED ORDER — ONDANSETRON HCL 4 MG PO TABS: 4.0000 mg | ORAL_TABLET | Freq: Three times a day (TID) | ORAL | 0 refills | Status: DC | PRN

## 2023-12-21 NOTE — Evaluation (Signed)
 Physical Therapy Evaluation Patient Details Name: Kelly Barajas MRN: [id number] DOB: Sep 09, 1950 Today's Date: 12/21/2023  History of Present Illness  73-yo female comes to Ed with several days  of right  side flank pain and dysuria, Blood culture  from 7/7 also grew E. coli. PMH: hyperlipidemia, hypothyroidism, essential hypertension, gout, overactive bladder, left knee arthroplasty and chronic osteoarthritis  Clinical Impression  The patient has been up ad lib in room. Ambulated in hall independently but did trip on her shoes x 2 and caught herself. Patient resides alone and is independent, goes to gym 3 days /week. No further PT needs, PT will sign off.     If plan is discharge home, recommend the following: Assist for transportation   Can travel by private vehicle        Equipment Recommendations None recommended by PT  Recommendations for Other Services       Functional Status Assessment Patient has not had a recent decline in their functional status     Precautions / Restrictions Precautions Precautions: Fall Precaution/Restrictions Comments: patient  tripped x 2 when walking, states it is her shoes Restrictions Weight Bearing Restrictions Per Provider Order: No      Mobility  Bed Mobility Overal bed mobility: Independent                  Transfers Overall transfer level: Independent                      Ambulation/Gait Ambulation/Gait assistance: Independent Gait Distance (Feet): 260 Feet Assistive device: None     Gait velocity interpretation: >2.62 ft/sec, indicative of community ambulatory   General Gait Details: pt. did trip x 2, reports due to her shoes which do appear a little floppy  Stairs            Wheelchair Mobility     Tilt Bed    Modified Rankin (Stroke Patients Only)       Balance Overall balance assessment: Mild deficits observed, not formally tested                                            Pertinent Vitals/Pain Pain Assessment Pain Assessment: No/denies pain    Home Living Family/patient expects to be discharged to:: Private residence Living Arrangements: Alone Available Help at Discharge: Available 24 hours/day Type of Home: House Home Access: Stairs to enter Entrance Stairs-Rails: Right Entrance Stairs-Number of Steps: 4   Home Layout: One level Home Equipment: Agricultural consultant (2 wheels)      Prior Function Prior Level of Function : Independent/Modified Independent                     Extremity/Trunk Assessment   Upper Extremity Assessment Upper Extremity Assessment: Overall WFL for tasks assessed    Lower Extremity Assessment Lower Extremity Assessment: Overall WFL for tasks assessed    Cervical / Trunk Assessment Cervical / Trunk Assessment: Normal  Communication   Communication Communication: No apparent difficulties    Cognition Arousal: Alert Behavior During Therapy: WFL for tasks assessed/performed   PT - Cognitive impairments: No apparent impairments                                 Cueing       General Comments General  comments (skin integrity, edema, etc.): pt reports goes to gym 3 days a week which has helped her balance    Exercises     Assessment/Plan    PT Assessment Patient does not need any further PT services  PT Problem List         PT Treatment Interventions      PT Goals (Current goals can be found in the Care Plan section)  Acute Rehab PT Goals Patient Stated Goal: go home PT Goal Formulation: All assessment and education complete, DC therapy    Frequency       Co-evaluation               AM-PAC PT 6 Clicks Mobility  Outcome Measure Help needed turning from your back to your side while in a flat bed without using bedrails?: None Help needed moving from lying on your back to sitting on the side of a flat bed without using bedrails?: None Help needed moving to and from a  bed to a chair (including a wheelchair)?: None Help needed standing up from a chair using your arms (e.g., wheelchair or bedside chair)?: None Help needed to walk in hospital room?: None Help needed climbing 3-5 steps with a railing? : None 6 Click Score: 24    End of Session   Activity Tolerance: Patient tolerated treatment well Patient left:  (up ad lib in room) Nurse Communication: Mobility status PT Visit Diagnosis: Unsteadiness on feet (R26.81)    Time: 9183-9174 PT Time Calculation (min) (ACUTE ONLY): 9 min   Charges:   PT Evaluation $PT Eval Low Complexity: 1 Low   PT General Charges $$ ACUTE PT VISIT: 1 Visit         Darice Potters PT Acute Rehabilitation Services Office [phone]   Potters Darice Norris 12/21/2023, 8:55 AM

## 2023-12-21 NOTE — Discharge Summary (Signed)
 Physician Discharge Summary   Patient: Kelly Barajas MRN: [id number] DOB: [birth year]  Admit date:     12/19/2023  Discharge date: 12/21/2023  Discharge Physician: Elgie Butter   PCP: [name], MD   Recommendations at discharge:  Please follow up with PCP in one week.  Please follow up with cardiology as recommended.   Discharge Diagnoses: Principal Problem:   E coli bacteremia Active Problems:   Mixed hyperlipidemia   Hypothyroidism   Gastroesophageal reflux disease without esophagitis   Essential hypertension   Chest pain  Resolved Problems:   * No resolved hospital problems. [facility] Course: 73-yo f mhx of hyperlipidemia, hypothyroidism, essential hypertension, gout, overactive bladder, left knee arthroplasty and chronic osteoarthritis emergency department complaining of right-sided flank pain, chill and dysuria for 7 days.  Patient initially seen in the ED 2 days ago during that time blood cultures has been obtained, received IV Rocephin  and sent home with oral Keflex .  Again presenting today with complaining of chills and continue to have right-sided flank pain.  Blood culture  from 7/7 also grew E. coli.  At presentation to ED patient found tachycardic 102 which has been rated 9.  Otherwise hemodynamically stable. UA showed evidence of UTI.  Urine culture in process. CMP showing slightly low sodium 134 otherwise unremarkable.  CBC no evidence of leukocytosis stable H&H normal platelet count.  Pending lactic acid level.  Blood cultures are in process. Chest x-ray no active cardiopulmonary disease process.  In the ED patient received ceftriaxone  2 g.  Hospitalist has been consulted for further management of E. coli bacteremia and pyelonephritis.        Assessment and Plan:   E coli bacteremia from Acute pyelonephritis:  Started on IV ceftriaxone  2g daily. And transitioned to oral antibiotics on discharge.  Repeat blood cultures are negative. Repeat urine  cultures are negative     Chest pain on admission.  Resolved.  Cardiology consulted and recommended outpatient evaluation.      Hypertension Continue with lisinopril  and hydrochlorothiazide .      Hyperlipidemia Resume statin and zetia .      Hypothyroidism:  Resume synthroid  88 mcg daily.      GERD  Stable.   Hyponatremia Sodium of 130. unclear etiology. She is asymptomatic.  Recheck BMP in one week.      Estimated body mass index is 30.11 kg/m as calculated from the following:   Height as of this encounter: 5' 3 (1.6 m).   Weight as of this encounter: 77.1 kg.   Consultants: none.  Procedures performed: none.   Disposition: Home Diet recommendation:  Discharge Diet Orders (From admission, onward)     Start     Ordered   12/21/23 0000  Diet - low sodium heart healthy        12/21/23 1116           Regular diet DISCHARGE MEDICATION: Allergies as of 12/21/2023       Reactions   Nickel Itching        Medication List     STOP taking these medications    ciprofloxacin  500 MG tablet Commonly known as: Cipro        TAKE these medications    acetaminophen  650 MG CR tablet Commonly known as: TYLENOL  Take 650 mg by mouth 2 (two) times daily.   aspirin  EC 81 MG tablet Take 81 mg by mouth daily. Swallow whole.   CALCIUM  + D PO Take 1 tablet by mouth 2 (two)  times daily.   cefadroxil  500 MG capsule Commonly known as: DURICEF Take 2 capsules (1,000 mg total) by mouth 2 (two) times daily for 7 days. What changed: how much to take   CoQ10 100 MG Caps Take 100 mg by mouth daily.   ezetimibe  10 MG tablet Commonly known as: Zetia  Take 1 tablet (10 mg total) by mouth daily.   FISH OIL PO Take 2,000 mg by mouth 2 (two) times daily.   lisinopril -hydrochlorothiazide  20-12.5 MG tablet Commonly known as: ZESTORETIC  Take 1 tablet by mouth once daily   magnesium  oxide 400 (240 Mg) MG tablet Commonly known as: MAG-OX Take 400 mg by mouth 2  (two) times daily.   omeprazole 20 MG capsule Commonly known as: PRILOSEC Take 20 mg by mouth daily.   ondansetron  4 MG tablet Commonly known as: Zofran  Take 1 tablet (4 mg total) by mouth every 8 (eight) hours as needed for nausea or vomiting.   OVER THE COUNTER MEDICATION Inhale 1 puff into the lungs daily as needed (congestion). himalayan pink salt inhaler   Pitavastatin  Calcium  4 MG Tabs Take 1 tablet by mouth once daily   Synthroid  88 MCG tablet Generic drug: levothyroxine  TAKE 1 TABLET BY MOUTH ONCE DAILY BEFORE BREAKFAST   traMADol  50 MG tablet Commonly known as: ULTRAM  Take 1 tablet (50 mg total) by mouth every 6 (six) hours as needed for moderate pain (pain score 4-6).        Follow-up Information     [name], MD. Schedule an appointment as soon as possible for a visit in 1 week(s).   Specialty: Family Medicine Contact information: [phone] [place] [id number] [phone]                Discharge Exam: [name]   12/19/23 0104  Weight: 77.1 kg   General exam: Appears calm and comfortable  Respiratory system: Clear to auscultation. Respiratory effort normal. Cardiovascular system: S1 & S2 heard, RRR. No JVD,  Gastrointestinal system: Abdomen is nondistended, soft and nontender. Central nervous system: Alert and oriented.  Extremities: Symmetric 5 x 5 power. Skin: No rashes,  Psychiatry:  Mood & affect appropriate.    Condition at discharge: fair  The results of significant diagnostics from this hospitalization (including imaging, microbiology, ancillary and laboratory) are listed below for reference.   Imaging Studies: DG Chest Port 1 View Result Date: 12/19/2023 CLINICAL DATA:  Question of sepsis to evaluate for abnormality. Urinary tract infection. EXAM: PORTABLE CHEST 1 VIEW COMPARISON:  09/12/2021 FINDINGS: Shallow inspiration. Heart size and pulmonary vascularity are normal for technique. No airspace disease or  consolidation in the lungs. No pleural effusion or pneumothorax. Esophageal hiatal hernia behind the heart. IMPRESSION: No active disease. Electronically Signed   By: Elsie Gravely M.D.   On: 12/19/2023 02:37    Microbiology: Results for orders placed or performed during the hospital encounter of 12/19/23  Urine Culture     Status: None   Collection Time: 12/19/23  2:29 AM   Specimen: Urine, Random  Result Value Ref Range Status   Specimen Description   Final    URINE, RANDOM Performed at [facility], [address] [place], [place] [address]    Special Requests   Final    NONE Reflexed from [id number] Performed at [facility], [address], [place], [place] [address]    Culture   Final    NO GROWTH Performed at [facility], [address] [place], [place] [address]    Report Status 12/20/2023 FINAL  Final  Blood Culture (routine x 2)     Status: None (Preliminary result)   Collection Time: 12/19/23  3:40 AM   Specimen: BLOOD LEFT FOREARM  Result Value Ref Range Status   Specimen Description   Final    BLOOD LEFT FOREARM Performed at [facility], [address], [place], [place] [address]    Special Requests   Final    BOTTLES DRAWN AEROBIC AND ANAEROBIC Blood Culture results may not be optimal due to an inadequate volume of blood received in culture bottles Performed at [facility], [address] [place], [place] [address]    Culture   Final    NO GROWTH 2 DAYS Performed at [facility], [address] [place], [place] [address]    Report Status PENDING  Incomplete  Blood Culture (routine x 2)     Status: None (Preliminary result)   Collection Time: 12/19/23  3:45 AM   Specimen: BLOOD  Result Value Ref Range Status   Specimen Description   Final    BLOOD LEFT ANTECUBITAL Performed at [facility], [address] [place], [place] [address]    Special Requests   Final     BOTTLES DRAWN AEROBIC AND ANAEROBIC Blood Culture results may not be optimal due to an inadequate volume of blood received in culture bottles Performed at [facility], [address], [place], [place] [address]    Culture   Final    NO GROWTH 2 DAYS Performed at [facility], [address], [place], [place] [address]    Report Status PENDING  Incomplete    Labs: CBC: Recent Labs  Lab 12/17/23 1916 12/19/23 0323 12/20/23 0441  WBC 11.9* 7.7 6.7  NEUTROABS 10.3* 5.9  --   HGB 14.0 12.1 12.0  HCT 43.3 38.0 37.7  MCV 88.7 89.8 90.6  PLT 288 211 207   Basic Metabolic Panel: Recent Labs  Lab 12/17/23 1916 12/19/23 0323 12/20/23 0441  NA 136 134* 130*  K 3.9 3.5 3.7  CL 100 102 98  CO2 19* 23 22  GLUCOSE 145* 118* 112*  BUN 19 15 13   CREATININE 1.23* 0.93 0.81  CALCIUM  10.4* 9.1 8.9   Liver Function Tests: Recent Labs  Lab 12/19/23 0323 12/20/23 0441  AST 28 23  ALT 20 20  ALKPHOS 68 66  BILITOT 0.7 0.7  PROT 6.8 6.8  ALBUMIN 3.4* 3.4*   CBG: No results for input(s): GLUCAP in the last 168 hours.  Discharge time spent: 41 minutes.   Signed: Nita Whitmire, MD [facility]

## 2023-12-21 NOTE — Telephone Encounter (Signed)
 Post ED Visit - Positive Culture Follow-up  Culture report reviewed by antimicrobial stewardship pharmacist: [facility] [x]  Clifford [place], [place].D. []  Venetia Gully, Pharm.D., BCPS AQ-ID []  Garrel Crews, Pharm.D., BCPS []  Almarie Lunger, Pharm.D., BCPS []  [place], [address].D., BCPS, AAHIVP []  Rosaline Bihari, Pharm.D., BCPS, AAHIVP []  Vernell Meier, PharmD, BCPS []  Latanya Hint, PharmD, BCPS []  Donald Medley, PharmD, BCPS []  Rocky Bold, PharmD []  Dorothyann Alert, PharmD, BCPS []  Morene Babe, PharmD  [facility] []  Rosaline Edison, PharmD []  Romona Bliss, PharmD []  Dolphus Roller, PharmD []  Veva Seip, Rph []  Vernell Daunt) Leonce, PharmD []  Eva Allis, PharmD []  Rosaline Millet, PharmD []  Iantha Batch, PharmD []  Arvin Gauss, PharmD []  Wanda Hasting, PharmD []  Ronal Rav, PharmD []  Rocky Slade, PharmD []  Bard Jeans, PharmD   Positive urine and blood culture Treated with Cefadroxil , organism sensitive to the same and no further patient follow-up is required at this time.  Ruth Camelia Elbe 12/21/2023, 9:21 AM

## 2023-12-24 ENCOUNTER — Telehealth: Payer: Self-pay | Admitting: *Deleted

## 2023-12-24 LAB — CULTURE, BLOOD (ROUTINE X 2)
Culture: NO GROWTH
Culture: NO GROWTH

## 2023-12-24 NOTE — Transitions of Care (Post Inpatient/ED Visit) (Signed)
   12/24/2023  Name: Kelly Barajas MRN: [id number] DOB: 08-05-1950  Today's TOC FU Call Status: Today's TOC FU Call Status:: Unsuccessful Call (1st Attempt) Unsuccessful Call (1st Attempt) Date: 12/24/23  Attempted to reach the patient regarding the most recent Inpatient/ED visit.  Follow Up Plan: Additional outreach attempts will be made to reach the patient to complete the Transitions of Care (Post Inpatient/ED visit) call.   Mliss Creed [facility], BSN RN Care Manager/ Transition of Care [place]/ [facility] [phone]

## 2023-12-25 ENCOUNTER — Telehealth: Payer: Self-pay | Admitting: *Deleted

## 2023-12-25 NOTE — Transitions of Care (Post Inpatient/ED Visit) (Signed)
   12/25/2023  Name: Kelly Barajas MRN: [id number] DOB: 1950/11/29  Today's TOC FU Call Status: Today's TOC FU Call Status:: Unsuccessful Call (2nd Attempt) Unsuccessful Call (2nd Attempt) Date: 12/25/23  Attempted to reach the patient regarding the most recent Inpatient/ED visit.  Follow Up Plan: Additional outreach attempts will be made to reach the patient to complete the Transitions of Care (Post Inpatient/ED visit) call.   Mliss Creed [facility], BSN RN Care Manager/ Transition of Care [place]/ [facility] [phone]

## 2023-12-26 ENCOUNTER — Telehealth: Payer: Self-pay | Admitting: *Deleted

## 2023-12-26 ENCOUNTER — Encounter: Payer: Self-pay | Admitting: Family Medicine

## 2023-12-26 ENCOUNTER — Ambulatory Visit: Admitting: Family Medicine

## 2023-12-26 VITALS — BP 132/82 | HR 89 | Temp 98.4°F | Ht 63.0 in | Wt 167.8 lb

## 2023-12-26 DIAGNOSIS — B9620 Unspecified Escherichia coli [E. coli] as the cause of diseases classified elsewhere: Secondary | ICD-10-CM

## 2023-12-26 DIAGNOSIS — R7881 Bacteremia: Secondary | ICD-10-CM

## 2023-12-26 DIAGNOSIS — E782 Mixed hyperlipidemia: Secondary | ICD-10-CM | POA: Diagnosis not present

## 2023-12-26 DIAGNOSIS — N12 Tubulo-interstitial nephritis, not specified as acute or chronic: Secondary | ICD-10-CM

## 2023-12-26 DIAGNOSIS — R0789 Other chest pain: Secondary | ICD-10-CM | POA: Diagnosis not present

## 2023-12-26 NOTE — Progress Notes (Deleted)
 Cardiology Office Note:    Date:  12/26/2023   ID:  Kelly Barajas, DOB 1950-09-21, MRN [id number]  PCP:  [name], MD  Cardiologist:  None { Click to update primary MD,subspecialty MD or APP then REFRESH:1}    Referring MD: [name], MD   Chief Complaint: hospital follow-up of atypical chest pain  History of Present Illness:    Kelly Barajas is a 73 y.o. female with a history of hypertension, hyperlipidemia, hypothyroidism, hepatitis, vitamin D  deficiency, and arthritis who is followed by [facility] and presents today for hospital follow-up of atypical chest pain.  Patient was previously seen by Dr. Debera in 09/2012 for dyspnea on exertion. Stress Echo was ordered with no evidence of ischemia. She was not seen by Cardiology again until recent admission in 12/2023. PCP ordered an Echo in 09/2022 for further evaluation of a murmur which showed LVEF of 60-65% with normal wall motion and grade 1 diastolic dysfunction, low normal RV function, and no significant valvular disease (aortic sclerosis but no aortic stenosis).  Patient was recently admitted from 12/19/2023 to 12/21/2023 for E.coli bacteremia secondary to acute pyelonephritis. She also reported one brief episode of chest pain while en route to the ED after being told of positive blood cultures that were drawn by PCP. However, repeat blood cultures and urine culture in the inpatient setting were negative. She was treated with antibiotics. She denied any other chest pain. EKG showed no acute ischemic changes. No troponin was checked. Cardiology was consulted and did not feel like any additional work-up was needed.   Patient presents today for follow-up. ***  Atypica Chest Pain Patient was reported one brief episode of atypical chest pain during recent admission for E.coli bacteremia secondary to acute pyelonephritis. She was seen by Cardiology and no further work-up was felt to be needed.  - ***  Hypertension BP  *** - Continue Lisinopril -HCTZ 20-12.5mg  daily.   Hyperlipidemia Lipid panel in 09/2023: Total Cholesterol 197, Triglycerides 213, HDL 45.60, LDL 109.  - Currently on Pitavastatin  4mg  daily and Zetia  10mg  daily. Zetia  was added at time of last lab check. Per Dr. Lavona, plan was to switch to Repatha as an outpatient. *** - Will get repeat lipid panel and refer to [facility]. ***   EKGs/Labs/Other Studies Reviewed:    The following studies were reviewed:  Echocardiogram 09/20/2022: Impressions: 1. Left ventricular ejection fraction, by estimation, is 60 to 65%. Left  ventricular ejection fraction by 3D volume is 64 %. The left ventricle has  normal function. The left ventricle has no regional wall motion  abnormalities. Left ventricular diastolic   parameters are consistent with Grade I diastolic dysfunction (impaired  relaxation).   2. Right ventricular systolic function is low normal. The right  ventricular size is normal. Tricuspid regurgitation signal is inadequate  for assessing PA pressure.   3. The mitral valve is abnormal. Trivial mitral valve regurgitation.   4. The aortic valve is calcified. Aortic valve regurgitation is not  visualized. Aortic valve sclerosis/calcification is present, without any  evidence of aortic stenosis.     EKG:  EKG not ordered today.   Recent Labs: 09/18/2023: TSH 0.49 12/20/2023: ALT 20; BUN 13; Creatinine, Ser 0.81; Hemoglobin 12.0; Platelets 207; Potassium 3.7; Sodium 130  Recent Lipid Panel    Component Value Date/Time   CHOL 197 09/18/2023 0941   TRIG 213.0 (H) 09/18/2023 0941   HDL 45.60 09/18/2023 0941   CHOLHDL 4 09/18/2023 0941  VLDL 42.6 (H) 09/18/2023 0941   LDLCALC 109 (H) 09/18/2023 0941   LDLDIRECT 116.0 01/16/2023 0845    Physical Exam:    Vital Signs: There were no vitals taken for this visit.    Wt Readings from Last 3 Encounters:  12/26/23 167 lb 12.8 oz (76.1 kg)  12/19/23 170 lb (77.1 kg)  12/18/23  170 lb 9.6 oz (77.4 kg)     General: 73 y.o. female in no acute distress. HEENT: Normocephalic and atraumatic. Sclera clear.  Neck: Supple. No carotid bruits. No JVD. Heart: *** RRR. Distinct S1 and S2. No murmurs, gallops, or rubs.  Lungs: No increased work of breathing. Clear to ausculation bilaterally. No wheezes, rhonchi, or rales.  Abdomen: Soft, non-distended, and non-tender to palpation.  Extremities: No lower extremity edema.  Radial and distal pedal pulses 2+ and equal bilaterally. Skin: Warm and dry. Neuro: No focal deficits. Psych: Normal affect. Responds appropriately.   Assessment:    No diagnosis found.  Plan:     Disposition: Follow up in ***   Signed, Aline FORBES Jadine DEVONNA  12/26/2023 6:39 PM    [facility]

## 2023-12-26 NOTE — Transitions of Care (Post Inpatient/ED Visit) (Signed)
   12/26/2023  Name: ANEISA KARREN MRN: [id number] DOB: [birth year]  Today's TOC FU Call Status: Today's TOC FU Call Status:: Unsuccessful Call (3rd Attempt) Unsuccessful Call (3rd Attempt) Date: 12/26/23  Attempted to reach the patient regarding the most recent Inpatient/ED visit.  Follow Up Plan: No further outreach attempts will be made at this time. We have been unable to contact the patient.  Cathlean Headland BSN RN  [facility] Health Care Management Coordinator [email or web address] Direct Dial: [phone]  Fax: [phone] Website: [email or web address]

## 2023-12-26 NOTE — Transitions of Care (Post Inpatient/ED Visit) (Signed)
 12/26/2023  Name: Kelly Barajas MRN: [id number] DOB: 10/16/1950  Today's TOC FU Call Status: Today's TOC FU Call Status:: Successful TOC FU Call Completed TOC FU Call Complete Date: 12/26/23 Patient's Name and Date of Birth confirmed.  Transition Care Management Follow-up Telephone Call Date of Discharge: 12/21/23 Discharge Facility: [facility]) Type of Discharge: Inpatient Admission Primary Inpatient Discharge Diagnosis:: E coli bacteremia How have you been since you were released from the hospital?: Better Any questions or concerns?: No  Items Reviewed: Any new allergies since your discharge?: Yes Dietary orders reviewed?: Yes Type of Diet Ordered:: low sodium heart healthy Do you have support at home?: Yes People in Home [RPT]: alone Name of Support/Comfort Primary Source: Kelly Barajas  Medications Reviewed Today: Medications Reviewed Today     Reviewed by Kennieth Cathlean DEL, RN (Case Manager) on 12/26/23 at 1441  Med List Status: <None>   Medication Order Taking? Sig Documenting Provider Last Dose Status Informant  acetaminophen  (TYLENOL ) 650 MG CR tablet [id number] Yes Take 650 mg by mouth 2 (two) times daily. [provider]  Active Self, Pharmacy Records  aspirin  EC 81 MG tablet [id number] Yes Take 81 mg by mouth daily. Swallow whole. [provider]  Active Self, Pharmacy Records  Calcium  Citrate-Vitamin D  (CALCIUM  + D PO) [id number] Yes Take 1 tablet by mouth 2 (two) times daily. [provider]  Active Self, Pharmacy Records  cefadroxil  (DURICEF) 500 MG capsule [id number] Yes Take 2 capsules (1,000 mg total) by mouth 2 (two) times daily for 7 days. Akula, Vijaya, MD  Active   Coenzyme Q10 (COQ10) 100 MG CAPS [id number] Yes Take 100 mg by mouth daily. [provider]  Active Self, Pharmacy Records  ezetimibe  (ZETIA ) 10 MG tablet [id number] Yes Take 1 tablet (10 mg total) by mouth daily. [name], MD  Active Self, Pharmacy Records   lisinopril -hydrochlorothiazide  (ZESTORETIC ) 20-12.5 MG tablet [id number] Yes Take 1 tablet by mouth once daily [name], MD  Active Self, Pharmacy Records  magnesium  oxide (MAG-OX) 400 (240 Mg) MG tablet [id number] Yes Take 400 mg by mouth 2 (two) times daily. [provider]  Active Self, Pharmacy Records  Omega-3 Fatty Acids (FISH OIL PO) [id number] Yes Take 2,000 mg by mouth 2 (two) times daily. [provider]  Active Self, Pharmacy Records  omeprazole (PRILOSEC) 20 MG capsule [id number] Yes Take 20 mg by mouth daily. [provider]  Active Self, Pharmacy Records  ondansetron  (ZOFRAN ) 4 MG tablet [id number] Yes Take 1 tablet (4 mg total) by mouth every 8 (eight) hours as needed for nausea or vomiting. Cherlyn Labella, MD  Active   OVER THE COUNTER MEDICATION [id number] Yes Inhale 1 puff into the lungs daily as needed (congestion). himalayan pink salt inhaler [provider]  Active Self, Pharmacy Records  Pitavastatin  Calcium  4 MG TABS [id number] Yes Take 1 tablet by mouth once daily [name], MD  Active Self, Pharmacy Records  SYNTHROID  88 MCG tablet [id number] Yes TAKE 1 TABLET BY MOUTH ONCE DAILY BEFORE BREAKFAST [name], MD  Active Self, Pharmacy Records  traMADol  (ULTRAM ) 50 MG tablet [id number] Yes Take 1 tablet (50 mg total) by mouth every 6 (six) hours as needed for moderate pain (pain score 4-6). [name], MD  Active Self, Pharmacy Records            Home Care and Equipment/Supplies: Were Home Health Services Ordered?: NA Any new equipment or medical supplies ordered?: NA  Functional Questionnaire:  Do you need assistance with bathing/showering or dressing?: No Do you need assistance with meal preparation?: No Do you need assistance with eating?: No Do you have difficulty maintaining continence: No Do you need assistance with getting out of bed/getting out of a chair/moving?: No Do you have difficulty managing or taking  your medications?: No  Follow up appointments reviewed: PCP Follow-up appointment confirmed?: Yes Date of PCP follow-up appointment?: 12/26/23 Follow-up Provider: Lavern Heck Specialist [facility] Follow-up appointment confirmed?: Yes Date of Specialist follow-up appointment?: 01/08/24 Follow-Up Specialty Provider:: [name] Do you need transportation to your follow-up appointment?: No Do you understand care options if your condition(s) worsen?: Yes-patient verbalized understanding  SDOH Interventions Today    Flowsheet Row Most Recent Value  SDOH Interventions   Food Insecurity Interventions Intervention Not Indicated  Housing Interventions Intervention Not Indicated  Transportation Interventions Intervention Not Indicated  Utilities Interventions Intervention Not Indicated    Cathlean Headland BSN RN [facility] Health Care Management Coordinator [email or web address] Direct Dial: [phone]  Fax: [phone] Website: [email or web address]

## 2023-12-26 NOTE — Progress Notes (Signed)
 Subjective  CC:  Chief Complaint  Patient presents with   Hospitalization Follow-up    Discharged 12/19/2023 - 12/21/2023 (2 days) [facility]  E coli bacteremia Principal problem  12/17/2023 (1 hours) [facility] Emergency Department at [facility]  Dysuria     HPI: Kelly Barajas is a 73 y.o. female who presents to the office today to address the problems listed above in the chief complaint. 73 year old female returns after hospitalization for E. coli bacteremia likely from pyelonephritis.  I reviewed all emergency room and hospital records.  Fortunately, she is much improved.  Treated with IV antibiotics and now on Duricef to complete her course of antibiotics.  Follow-up blood cultures and urinary cultures are negative.  She is no longer having fevers chills or night sweats.  Energy is returning.  Appetite is returning.  No urinary symptoms or flank pain.  Has mild nausea related to the Duricef.  No vomiting.  Unfortunately she did become constipated and is dealing with some hemorrhoidal pain.  Using topical creams over-the-counter.  No diarrhea. Atypical chest pain: She did have an episode of mild chest pain on the way to the emergency room after being told that she had positive blood cultures.  In retrospect, she admits she was quite stressed, her daughter's father-in-law recently passed due to sepsis.  She believes her chest pain was most likely related to anxiety.  No history of chest pain.  She does have hypertension hyperlipidemia as cardiovascular risk factors.  Strong family history of cardiovascular disease but her father was heavy heavy smoker and had heart disease in his 67s.  She is on a statin and Zetia  for hypercholesterolemia.  She is due for recheck of her lipid panel since we added Zetia  in April.  She has had no further episodes of chest pain.  She had a normal echocardiogram last year except for a calcified aortic valve.  I reviewed cardiology  consultation.  Assessment  1. E coli bacteremia   2. Pyelonephritis   3. Atypical chest pain   4. Mixed hyperlipidemia       Plan  E. coli bacteremia from pyelonephritis: Fortunately, recovering well.  Continue complete course of Duricef.  No further symptoms or signs of infection.  White blood cell count was normal prior to discharge.  No further lab work done today.  Of note, she had mild hyponatremia likely due from dehydration.  Will not repeat today, she is hydrating with Gatorade, fluids and appetite is better. Atypical chest pain: Likely stress-induced.  Will monitor.  If new symptoms present, will recommend further ischemic workup.  Patient agrees Hyperlipidemia: On Zetia  and Livalo .  Will recheck fasting lipids in October and adjust medications if needed.  Continue low-fat diet with exercise  Follow up: As scheduled 03/20/2024  No orders of the defined types were placed in this encounter.  No orders of the defined types were placed in this encounter.     I reviewed the patients updated PMH, FH, and SocHx.    Patient Active Problem List   Diagnosis Date Noted   Hypothyroidism 06/01/2014    Priority: High   Essential hypertension 11/27/2013    Priority: High   Mixed hyperlipidemia 10/04/2012    Priority: High   Osteopenia 08/21/2019    Priority: Medium    Gastroesophageal reflux disease without esophagitis 11/27/2013    Priority: Medium    Aortic valve sclerosis systolic heart murmur 09/28/2022    Priority: Low   S/P total knee  arthroplasty, left 10/28/2021    Priority: Low   Elevated LFTs 09/01/2020    Priority: Low   Bilateral primary osteoarthritis of knee 02/19/2019    Priority: Low   Obesity (BMI 30-39.9) 09/05/2016    Priority: Low   Overactive bladder 03/07/2016    Priority: Low   Vitamin D  deficiency 03/07/2016    Priority: Low   E coli bacteremia 12/19/2023   Chest pain 12/19/2023   Allergic rhinitis due to pollen 10/24/2022   Current Meds   Medication Sig   acetaminophen  (TYLENOL ) 650 MG CR tablet Take 650 mg by mouth 2 (two) times daily.   aspirin  EC 81 MG tablet Take 81 mg by mouth daily. Swallow whole.   Calcium  Citrate-Vitamin D  (CALCIUM  + D PO) Take 1 tablet by mouth 2 (two) times daily.   cefadroxil  (DURICEF) 500 MG capsule Take 2 capsules (1,000 mg total) by mouth 2 (two) times daily for 7 days.   Coenzyme Q10 (COQ10) 100 MG CAPS Take 100 mg by mouth daily.   ezetimibe  (ZETIA ) 10 MG tablet Take 1 tablet (10 mg total) by mouth daily.   lisinopril -hydrochlorothiazide  (ZESTORETIC ) 20-12.5 MG tablet Take 1 tablet by mouth once daily   magnesium  oxide (MAG-OX) 400 (240 Mg) MG tablet Take 400 mg by mouth 2 (two) times daily.   Omega-3 Fatty Acids (FISH OIL PO) Take 2,000 mg by mouth 2 (two) times daily.   omeprazole (PRILOSEC) 20 MG capsule Take 20 mg by mouth daily.   ondansetron  (ZOFRAN ) 4 MG tablet Take 1 tablet (4 mg total) by mouth every 8 (eight) hours as needed for nausea or vomiting.   OVER THE COUNTER MEDICATION Inhale 1 puff into the lungs daily as needed (congestion). himalayan pink salt inhaler   Pitavastatin  Calcium  4 MG TABS Take 1 tablet by mouth once daily   SYNTHROID  88 MCG tablet TAKE 1 TABLET BY MOUTH ONCE DAILY BEFORE BREAKFAST   traMADol  (ULTRAM ) 50 MG tablet Take 1 tablet (50 mg total) by mouth every 6 (six) hours as needed for moderate pain (pain score 4-6).    Allergies: Patient is allergic to nickel. Family History: Patient family history includes Arthritis in her mother; CAD in her father and mother; Colon cancer (age of onset: 45) in her maternal grandmother; Depression in her mother; Diabetes in her mother; Diabetes Mellitus II in her mother; Heart attack in her father; Hyperlipidemia in her father and mother; Hypertension in her brother, mother, and sister; Hypothyroidism in her daughter and daughter. Social History:  Patient  reports that she has never smoked. She has never used smokeless tobacco.  She reports current alcohol use. She reports that she does not use drugs.  Review of Systems: Constitutional: Negative for fever malaise or anorexia Cardiovascular: negative for chest pain Respiratory: negative for SOB or persistent cough Gastrointestinal: negative for abdominal pain  Objective  Vitals: BP 132/82   Pulse 89   Temp 98.4 F (36.9 C)   Ht 5' 3 (1.6 m)   Wt 167 lb 12.8 oz (76.1 kg)   SpO2 96%   BMI 29.72 kg/m  General: no acute distress , A&Ox3 HEENT: PEERL, conjunctiva normal, neck is supple Cardiovascular:  RRR without murmur or gallop.  Respiratory:  Good breath sounds bilaterally, CTAB with normal respiratory effort Abdomen: Soft nontender, no suprapubic tenderness, no flank tenderness Skin:  Warm, no rashes  Commons side effects, risks, benefits, and alternatives for medications and treatment plan prescribed today were discussed, and the patient expressed understanding of  the given instructions. Patient is instructed to call or message via MyChart if he/she has any questions or concerns regarding our treatment plan. No barriers to understanding were identified. We discussed Red Flag symptoms and signs in detail. Patient expressed understanding regarding what to do in case of urgent or emergency type symptoms.  Medication list was reconciled, printed and provided to the patient in AVS. Patient instructions and summary information was reviewed with the patient as documented in the AVS. This note was prepared with assistance of Dragon voice recognition software. Occasional wrong-word or sound-a-like substitutions may have occurred due to the inherent limitations of voice recognition software

## 2024-01-07 NOTE — Progress Notes (Unsigned)
 Cardiology Office Note:  .   Date:  01/08/2024  ID:  Kelly Barajas, DOB [birth year], MRN [id number] PCP: Kelly Lavern CROME, MD  [facility] Providers Cardiologist:  Lynwood Schilling, MD {  History of Present Illness: .   Kelly Barajas is a 73 y.o. female with history of  hypertension, hyperlipidemia, hypothyroidism, hepatitis, osteoporosis, vitamin D  deficiency.     Cardiac workup Murmur heard.  Echocardiogram 09/2022, preserved biventricular function.  Aortic sclerosis.  Social history  Father with history of CABG.  Died of MI at the age of 53. Has never smoked, rarely drinks, never does drugs. Exercises routinely, works at the senior center 3 times per week.     Patient recently discharged from the hospital 12/2023 for acute pyelonephritis and found to have E. coli bacteremia.  They had chest pain while driving to the hospital, we had evaluated them in consult at the hospital and felt it was atypical.  Saw PCP after DC and also felt to be atypical.  Today patient presents for posthospital follow-up and reports that she only had 1 occurrence of chest pain after getting the news that she had bacteria in her blood which made her very anxious.  She reported no associated symptoms of radiation, nausea, vomiting, diaphoresis.  Episode only lasted a couple minutes without any reoccurrences.  She is recovering very well from her hospitalization, feels finally back to her baseline and was enjoying time by at the pool yesterday.  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  Otherwise she offers no other complaints.  ROS: Denies: Chest pain, shortness of breath, orthopnea, peripheral edema, palpitations, decreased exercise intolerance, fatigue, lightheadedness.   Studies Reviewed: .         Risk Assessment/Calculations:             Physical Exam:   VS:  BP 104/82 (BP Location: Right Arm, Patient Position: Sitting)    Pulse 80   Ht 5' 3 (1.6 m)   Wt 169 lb (76.7 kg)   SpO2 94%   BMI 29.94 kg/m    Wt Readings from Last 3 Encounters:  01/08/24 169 lb (76.7 kg)  12/26/23 167 lb 12.8 oz (76.1 kg)  12/19/23 170 lb (77.1 kg)    GEN: Well nourished, well developed in no acute distress NECK: No JVD; No carotid bruits CARDIAC: RRR, 2 out of 6 murmur RESPIRATORY:  Clear to auscultation without rales, wheezing or rhonchi  ABDOMEN: Soft, non-tender, non-distended EXTREMITIES:  No edema; No deformity   ASSESSMENT AND PLAN: .    Atypical chest pain  Patient reports that she had only one-time occurrence of chest pain after being told she had bacteria in her blood which she attributes to anxiety.  Has had no recurrences of this chest pain and previously very active working out 3 times a week without any exertional symptoms.  We discussed further evaluation for this and she feels this is unnecessary which I agree.  In the future we could consider doing coronary CTA given her family history of CAD and other risk factors. Reports she is chronically been on aspirin , reasonable given her family history. Discussed symptoms that would prompt further evaluation.  Aortic sclerosis Echocardiogram 09/2022 demonstrating preserved biventricular function with aortic sclerosis.  We can follow this clinically, no need for routine echocardiogram in the absence of symptoms.  Be mindful of any worsening symptoms of dyspnea on exertion  and shortness of breath.  Hypertension Very well-controlled. Continue with lisinopril  and asked HCTZ 20-12.5 mg daily.  Hyperlipidemia Statin intolerant LDL 109 09/2023.  She has no history of stroke/MI.   There was previous discussion of Repatha, we discussed this and at this time she politely declines.   Of note she did try atorvastatin, rosuvastatin and had myalgias/muscle spasms.     Dispo: Follow-up as needed.  Signed, Thom LITTIE Sluder, PA-C

## 2024-01-08 ENCOUNTER — Ambulatory Visit: Attending: Internal Medicine | Admitting: Cardiology

## 2024-01-08 ENCOUNTER — Encounter: Payer: Self-pay | Admitting: Cardiology

## 2024-01-08 VITALS — BP 104/82 | HR 80 | Ht 63.0 in | Wt 169.0 lb

## 2024-01-08 DIAGNOSIS — E782 Mixed hyperlipidemia: Secondary | ICD-10-CM

## 2024-01-08 DIAGNOSIS — R079 Chest pain, unspecified: Secondary | ICD-10-CM | POA: Diagnosis not present

## 2024-01-08 DIAGNOSIS — I358 Other nonrheumatic aortic valve disorders: Secondary | ICD-10-CM

## 2024-01-08 DIAGNOSIS — I10 Essential (primary) hypertension: Secondary | ICD-10-CM | POA: Diagnosis not present

## 2024-01-08 NOTE — Progress Notes (Signed)
 No leaning like let me click into it Cardiology Office Note:  .   Date:  01/08/2024  ID:  Kelly Barajas, DOB [birth year], MRN [id number] PCP: [name], MD  [facility] Providers Cardiologist:  Lynwood Schilling, MD {  History of Present Illness: .   Kelly Barajas is a 73 y.o. female with history of  hypertension, hyperlipidemia, hypothyroidism, hepatitis, osteoporosis, vitamin D  deficiency.     Cardiac workup Murmur heard.  Echocardiogram 09/2022, preserved biventricular function.  Aortic sclerosis.  Social history  Father with history of CABG.  Died of MI at the age of 65. Has never smoked, rarely drinks, never does drugs. Exercises routinely, works at the senior center 3 times per week.     Patient recently discharged from the hospital 12/2023 for acute pyelonephritis and found to have E. coli bacteremia.  They had chest pain while driving to the hospital, we had evaluated them in consult at the hospital and felt it was atypical.  Saw PCP after DC and also felt to be atypical.  Today patient presents for posthospital follow-up and reports that she only had 1 occurrence of chest pain after getting the news that she had bacteria in her blood which made her very anxious.  She reported no associated symptoms of radiation, nausea, vomiting, diaphoresis.  Episode only lasted a couple minutes without any reoccurrences.  She is recovering very well from her hospitalization, feels finally back to her baseline and was enjoying time by at the pool yesterday.  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  Otherwise she offers no other complaints.  ROS: Denies: Chest pain, shortness of breath, orthopnea, peripheral edema, palpitations, decreased exercise intolerance, fatigue, lightheadedness.   Studies Reviewed: .         Risk Assessment/Calculations:             Physical Exam:   VS:  BP 104/82 (BP Location:  Right Arm, Patient Position: Sitting)   Pulse 80   Ht 5' 3 (1.6 m)   Wt 169 lb (76.7 kg)   SpO2 94%   BMI 29.94 kg/m    Wt Readings from Last 3 Encounters:  01/08/24 169 lb (76.7 kg)  12/26/23 167 lb 12.8 oz (76.1 kg)  12/19/23 170 lb (77.1 kg)    GEN: Well nourished, well developed in no acute distress NECK: No JVD; No carotid bruits CARDIAC: RRR, 2 out of 6 murmur RESPIRATORY:  Clear to auscultation without rales, wheezing or rhonchi  ABDOMEN: Soft, non-tender, non-distended EXTREMITIES:  No edema; No deformity   ASSESSMENT AND PLAN: .    Atypical chest pain  Patient reports that she had only one-time occurrence of chest pain after being told she had bacteria in her blood which she attributes to anxiety.  Has had no recurrences of this chest pain and previously very active working out 3 times a week without any exertional symptoms.  We discussed further evaluation for this and she feels this is unnecessary which I agree.  In the future we could consider doing coronary CTA given her family history of CAD and other risk factors. Reports she is chronically been on aspirin , reasonable given her family history. Discussed symptoms that would prompt further evaluation.  Aortic sclerosis Echocardiogram 09/2022 demonstrating preserved biventricular function with aortic sclerosis.  We can follow this clinically, no need for routine echocardiogram in the absence of symptoms.  Be mindful  of any worsening symptoms of dyspnea on exertion and shortness of breath.  Hypertension Very well-controlled. Continue with lisinopril  and asked HCTZ 20-12.5 mg daily.  Hyperlipidemia Statin intolerant LDL 109 09/2023.  She has no history of stroke/MI.   Continue pitavastatin  Zetia  10 mg There was previous discussion of Repatha, we discussed this and at this time she politely declines.   Of note she did try atorvastatin, rosuvastatin and had myalgias/muscle spasms.     Dispo: Follow-up as  needed.  Signed, Thom LITTIE Sluder, PA-C

## 2024-01-08 NOTE — Patient Instructions (Signed)
 Medication Instructions:  No medication changes were made during today's visit.  *If you need a refill on your cardiac medications before your next appointment, please call your pharmacy*   Lab Work: No labs were ordered during today's visit.  If you have labs (blood work) drawn today and your tests are completely normal, you will receive your results only by: MyChart Message (if you have MyChart) OR A paper copy in the mail If you have any lab test that is abnormal or we need to change your treatment, we will call you to review the results.   Testing/Procedures: No procedures were ordered during today's visit.    Follow-Up: At [facility], you and your health needs are our priority.  As part of our continuing mission to provide you with exceptional heart care, we have created designated Provider Care Teams.  These Care Teams include your primary Cardiologist (physician) and Advanced Practice Providers (APPs -  Physician Assistants and Nurse Practitioners) who all work together to provide you with the care you need, when you need it.  We recommend signing up for the patient portal called MyChart.  Sign up information is provided on this After Visit Summary.  MyChart is used to connect with patients for Virtual Visits (Telemedicine).  Patients are able to view lab/test results, encounter notes, upcoming appointments, etc.  Non-urgent messages can be sent to your provider as well.   To learn more about what you can do with MyChart, go to [email or web address].    Your next appointment:  As needed  Other Instructions Thank you for choosing [facility]!

## 2024-01-20 ENCOUNTER — Other Ambulatory Visit: Payer: Self-pay | Admitting: Family Medicine

## 2024-01-31 ENCOUNTER — Other Ambulatory Visit: Payer: Self-pay | Admitting: Family Medicine

## 2024-02-18 ENCOUNTER — Other Ambulatory Visit: Payer: Self-pay | Admitting: Family Medicine

## 2024-03-20 ENCOUNTER — Ambulatory Visit (INDEPENDENT_AMBULATORY_CARE_PROVIDER_SITE_OTHER): Admitting: Family Medicine

## 2024-03-20 VITALS — BP 124/70 | HR 68 | Temp 97.7°F | Ht 63.0 in | Wt 169.8 lb

## 2024-03-20 DIAGNOSIS — I10 Essential (primary) hypertension: Secondary | ICD-10-CM

## 2024-03-20 DIAGNOSIS — M170 Bilateral primary osteoarthritis of knee: Secondary | ICD-10-CM | POA: Diagnosis not present

## 2024-03-20 DIAGNOSIS — R269 Unspecified abnormalities of gait and mobility: Secondary | ICD-10-CM | POA: Diagnosis not present

## 2024-03-20 DIAGNOSIS — Z23 Encounter for immunization: Secondary | ICD-10-CM

## 2024-03-20 NOTE — Progress Notes (Signed)
 Subjective  CC:  Chief Complaint  Patient presents with   Hypertension    HPI: Kelly Barajas is a 73 y.o. female who presents to the office today to address the problems listed above in the chief complaint. Hypertension f/u:  Discussed the use of AI scribe software for clinical note transcription with the patient, who gave verbal consent to proceed.  History of Present Illness Kelly Barajas is a 73 year old female with hypertension who presents with leg stiffness and mobility issues.  Lower extremity stiffness and mobility impairment - Stiffness in the right leg, particularly after prolonged sitting (e.g., in a restaurant or car) - Delayed response of the right leg upon standing, requiring a pause before walking - Stiffness is not associated with pain - Tightness and stiffness localized to the back of the right knee, especially after travel or changes in altitude - Difficulty fully extending the right leg - History of right knee replacement - Sensation of abnormal knee mechanics, described as 'bone on bone' - Reduced gym attendance, believed to contribute to increased stiffness - Bicycle riding twice weekly; intends to return to the gym to improve leg strength  Arthritis and joint dysfunction - History of arthritis, described as 'bone on bone' - Persistent sensation of tightness and mechanical dysfunction in the right knee following knee replacement  Blood pressure fluctuations and antihypertensive medication adherence - Blood pressure readings have been low, typically in the 120s/70s range - Recent elevated blood pressure attributed to emotional stress - Irregular adherence to blood pressure medication - Routine blood pressure monitoring, especially when caring for her aunt  Supplement use - Uses turmeric and glucosamine supplements - Takes glucosamine twice daily  Emotional stress - Recent loss of her aunt, who had atrial fibrillation, congestive heart failure,  and stage four kidney failure - Current emotional stress related to bereavement   Assessment  1. Essential hypertension   2. Need for influenza vaccination   3. Bilateral primary osteoarthritis of knee   4. Gait abnormality      Plan  Assessment and Plan Assessment & Plan Right knee osteoarthritis, status post knee replacement with Baker's cyst Chronic right knee osteoarthritis with stiffness and limited mobility after prolonged sitting, particularly in the right leg. Examination shows limited range of motion and inability to fully extend the knee. Suspected Baker's cyst contributing to symptoms. No acute pain, but mechanical issues are present. - Continue turmeric and glucosamine supplementation. - Encourage gentle stretching exercises to maintain flexibility. - Recommend strengthening exercises for quadriceps, including straight leg raises. - Advise intermittent standing and walking during prolonged sitting to alleviate stiffness.  Primary hypertension Blood pressure slightly elevated during visit, but she reports it has been running low at home. Recent emotional stress and bereavement may be contributing to current elevation. Continue current lisinopril /hydrochlorothiazide  daily. Well controlled.  Encounter for immunization Due for flu vaccination. - Administer flu shot.     Education regarding management of these chronic disease states was given. Management strategies discussed on successive visits include dietary and exercise recommendations, goals of achieving and maintaining IBW, and lifestyle modifications aiming for adequate sleep and minimizing stressors.  Follow up: 6 mo for cpe  Orders Placed This Encounter  Procedures   Flu vaccine HIGH DOSE PF(Fluzone Trivalent)   No orders of the defined types were placed in this encounter.     BP Readings from Last 3 Encounters:  03/20/24 124/70  01/08/24 104/82  12/26/23 132/82   Wt Readings from Last 3  Encounters:   03/20/24 169 lb 12.8 oz (77 kg)  01/08/24 169 lb (76.7 kg)  12/26/23 167 lb 12.8 oz (76.1 kg)    Lab Results  Component Value Date   CHOL 197 09/18/2023   CHOL 184 01/16/2023   CHOL 324 (H) 09/13/2022   Lab Results  Component Value Date   HDL 45.60 09/18/2023   HDL 42.10 01/16/2023   HDL 40.30 09/13/2022   Lab Results  Component Value Date   LDLCALC 109 (H) 09/18/2023   LDLCALC 110 (H) 09/13/2021   LDLCALC 114 (H) 09/01/2020   Lab Results  Component Value Date   TRIG 213.0 (H) 09/18/2023   TRIG 206.0 (H) 01/16/2023   TRIG 274.0 (H) 09/13/2022   Lab Results  Component Value Date   CHOLHDL 4 09/18/2023   CHOLHDL 4 01/16/2023   CHOLHDL 8 09/13/2022   Lab Results  Component Value Date   LDLDIRECT 116.0 01/16/2023   LDLDIRECT 207.0 09/13/2022   LDLDIRECT 196.0 03/15/2022   Lab Results  Component Value Date   CREATININE 0.81 12/20/2023   BUN 13 12/20/2023   NA 130 (L) 12/20/2023   K 3.7 12/20/2023   CL 98 12/20/2023   CO2 22 12/20/2023    The 10-year ASCVD risk score (Arnett DK, et al., 2019) is: 18%   Values used to calculate the score:     Age: 48 years     Clincally relevant sex: Female     Is Non-Hispanic African American: No     Diabetic: No     Tobacco smoker: No     Systolic Blood Pressure: 124 mmHg     Is BP treated: Yes     HDL Cholesterol: 45.6 mg/dL     Total Cholesterol: 197 mg/dL  I reviewed the patients updated PMH, FH, and SocHx.    Patient Active Problem List   Diagnosis Date Noted   Hypothyroidism 06/01/2014    Priority: High   Essential hypertension 11/27/2013    Priority: High   Mixed hyperlipidemia 10/04/2012    Priority: High   Osteopenia 08/21/2019    Priority: Medium    Gastroesophageal reflux disease without esophagitis 11/27/2013    Priority: Medium    Aortic valve sclerosis systolic heart murmur 09/28/2022    Priority: Low   S/P total knee arthroplasty, left 10/28/2021    Priority: Low   Elevated LFTs 09/01/2020     Priority: Low   Bilateral primary osteoarthritis of knee 02/19/2019    Priority: Low   Obesity (BMI 30-39.9) 09/05/2016    Priority: Low   Overactive bladder 03/07/2016    Priority: Low   Vitamin D  deficiency 03/07/2016    Priority: Low   E coli bacteremia 12/19/2023   Chest pain 12/19/2023   Allergic rhinitis due to pollen 10/24/2022    Allergies: Nickel  Social History: Patient  reports that she has never smoked. She has never used smokeless tobacco. She reports current alcohol use. She reports that she does not use drugs.  Current Meds  Medication Sig   acetaminophen  (TYLENOL ) 650 MG CR tablet Take 650 mg by mouth 2 (two) times daily.   aspirin  EC 81 MG tablet Take 81 mg by mouth daily. Swallow whole.   Calcium  Citrate-Vitamin D  (CALCIUM  + D PO) Take 1 tablet by mouth 2 (two) times daily.   Coenzyme Q10 (COQ10) 100 MG CAPS Take 100 mg by mouth daily.   ezetimibe  (ZETIA ) 10 MG tablet Take 1 tablet (10 mg total) by mouth daily.  lisinopril -hydrochlorothiazide  (ZESTORETIC ) 20-12.5 MG tablet Take 1 tablet by mouth once daily   magnesium  oxide (MAG-OX) 400 (240 Mg) MG tablet Take 400 mg by mouth 2 (two) times daily.   Omega-3 Fatty Acids (FISH OIL PO) Take 2,000 mg by mouth 2 (two) times daily.   omeprazole (PRILOSEC) 20 MG capsule Take 20 mg by mouth daily.   ondansetron  (ZOFRAN ) 4 MG tablet Take 1 tablet (4 mg total) by mouth every 8 (eight) hours as needed for nausea or vomiting.   OVER THE COUNTER MEDICATION Inhale 1 puff into the lungs daily as needed (congestion). himalayan pink salt inhaler   Pitavastatin  Calcium  4 MG TABS Take 1 tablet by mouth once daily   SYNTHROID  88 MCG tablet TAKE 1 TABLET BY MOUTH ONCE DAILY BEFORE BREAKFAST   traMADol  (ULTRAM ) 50 MG tablet Take 1 tablet (50 mg total) by mouth every 6 (six) hours as needed for moderate pain (pain score 4-6).    Review of Systems: Cardiovascular: negative for chest pain, palpitations, leg swelling,  orthopnea Respiratory: negative for SOB, wheezing or persistent cough Gastrointestinal: negative for abdominal pain Genitourinary: negative for dysuria or gross hematuria  Objective  Vitals: BP 124/70 Comment: by home readings; pt is stressed in office today  Pulse 68   Temp 97.7 F (36.5 C)   Ht 5' 3 (1.6 m)   Wt 169 lb 12.8 oz (77 kg)   SpO2 96%   BMI 30.08 kg/m  General: no acute distress  Psych:  Alert and oriented, normal mood and affect HEENT:  Normocephalic, atraumatic, supple neck  Cardiovascular:  RRR without murmur. no edema Respiratory:  Good breath sounds bilaterally, CTAB with normal respiratory effort Skin:  Warm, no rashes Neurologic:   Mental status is normal Commons side effects, risks, benefits, and alternatives for medications and treatment plan prescribed today were discussed, and the patient expressed understanding of the given instructions. Patient is instructed to call or message via MyChart if he/she has any questions or concerns regarding our treatment plan. No barriers to understanding were identified. We discussed Red Flag symptoms and signs in detail. Patient expressed understanding regarding what to do in case of urgent or emergency type symptoms.  Medication list was reconciled, printed and provided to the patient in AVS. Patient instructions and summary information was reviewed with the patient as documented in the AVS. This note was prepared with assistance of Dragon voice recognition software. Occasional wrong-word or sound-a-like substitutions may have occurred due to the inherent limitation

## 2024-03-20 NOTE — Patient Instructions (Signed)
 Please return in 6 months for your annual complete physical; please come fasting.  For follow up on chronic medical conditions   If you have any questions or concerns, please don't hesitate to send me a message via MyChart or call the office at [phone]. Thank you for visiting with us  today! It's our pleasure caring for you.

## 2024-03-27 DIAGNOSIS — H2513 Age-related nuclear cataract, bilateral: Secondary | ICD-10-CM | POA: Diagnosis not present

## 2024-03-27 DIAGNOSIS — H40033 Anatomical narrow angle, bilateral: Secondary | ICD-10-CM | POA: Diagnosis not present

## 2024-04-22 ENCOUNTER — Other Ambulatory Visit: Payer: Self-pay | Admitting: Family Medicine

## 2024-05-01 ENCOUNTER — Other Ambulatory Visit: Payer: Self-pay | Admitting: Family Medicine

## 2024-05-19 ENCOUNTER — Other Ambulatory Visit: Payer: Self-pay | Admitting: Family Medicine

## 2024-05-29 ENCOUNTER — Other Ambulatory Visit

## 2024-07-10 ENCOUNTER — Ambulatory Visit: Payer: PPO

## 2024-07-16 ENCOUNTER — Ambulatory Visit (INDEPENDENT_AMBULATORY_CARE_PROVIDER_SITE_OTHER)

## 2024-07-16 VITALS — BP 119/70 | Ht 63.0 in | Wt 169.0 lb

## 2024-07-16 DIAGNOSIS — Z1231 Encounter for screening mammogram for malignant neoplasm of breast: Secondary | ICD-10-CM

## 2024-07-16 DIAGNOSIS — Z0000 Encounter for general adult medical examination without abnormal findings: Secondary | ICD-10-CM

## 2024-07-16 NOTE — Patient Instructions (Addendum)
 You have an order for:  []   2D Mammogram  []   3D Mammogram  []   Bone Density     Please call for appointment:   [facility] [address] [place], [place] [address] [phone]  Make sure to wear two-piece clothing.  No lotions powders or deodorants the day of the appointment Make sure to bring picture ID and insurance card.  Bring list of medications you are currently taking including any supplements.   Schedule your [place] screening mammogram through MyChart!   Log into your MyChart account.  Go to 'Visit' (or 'Appointments' if on mobile App) --> Schedule an Appointment  Under 'Select a Reason for Visit' choose the Mammogram Screening option.  Complete the pre-visit questions and select the time and place that best fits your schedule.  Kelly Barajas,  Thank you for taking the time for your Medicare Wellness Visit. I appreciate your continued commitment to your health goals. Please review the care plan we discussed, and feel free to reach out if I can assist you further.  Please note that Annual Wellness Visits do not include a physical exam. Some assessments may be limited, especially if the visit was conducted virtually. If needed, we may recommend an in-person follow-up with your provider.  Ongoing Care Seeing your primary care provider every 3 to 6 months helps us  monitor your health and provide consistent, personalized care.   Referrals If a referral was made during today's visit and you haven't received any updates within two weeks, please contact the referred provider directly to check on the status.  Recommended Screenings:  Health Maintenance  Topic Date Due   DTaP/Tdap/Td vaccine (2 - Td or Tdap) 10/17/2021   COVID-19 Vaccine (4 - 2025-26 season) 02/11/2024   Osteoporosis screening with Bone Density Scan  03/03/2024   Breast Cancer Screening  08/22/2024   Colon Cancer Screening  03/26/2025   Medicare Annual Wellness Visit   07/16/2025   Pneumococcal Vaccine for age over 6  Completed   Flu Shot  Completed   Hepatitis C Screening  Completed   Zoster (Shingles) Vaccine  Completed   Meningitis B Vaccine  Aged Out       12/19/2023    2:02 PM  Advanced Directives  Would patient like information on creating a medical advance directive? No - Patient declined    Vision: Annual vision screenings are recommended for early detection of glaucoma, cataracts, and diabetic retinopathy. These exams can also reveal signs of chronic conditions such as diabetes and high blood pressure.  Dental: Annual dental screenings help detect early signs of oral cancer, gum disease, and other conditions linked to overall health, including heart disease and diabetes.  Please see the attached documents for additional preventive care recommendations.

## 2024-07-16 NOTE — Progress Notes (Signed)
 "  Chief Complaint  Patient presents with   Medicare Wellness     Subjective:   Kelly Barajas is a 74 y.o. female who presents for a Medicare Annual Wellness Visit.  Visit info / Clinical Intake: Medicare Wellness Visit Type:: Subsequent Annual Wellness Visit Persons participating in visit and providing information:: patient Medicare Wellness Visit Mode:: Video Since this visit was completed virtually, some vitals may be partially provided or unavailable. Missing vitals are due to the limitations of the virtual format.: Documented vitals are patient reported If Telephone or Video please confirm:: I connected with patient using audio/video enable telemedicine. I verified patient identity with two identifiers, discussed telehealth limitations, and patient agreed to proceed. Patient Location:: home Provider Location:: home office Interpreter Needed?: No Pre-visit prep was completed: yes AWV questionnaire completed by patient prior to visit?: yes Date:: 07/09/24 Living arrangements:: (!) (Patient-Rptd) lives alone Patient's Overall Health Status Rating: (Patient-Rptd) very good Typical amount of pain: (Patient-Rptd) some Does pain affect daily life?: (Patient-Rptd) no Are you currently prescribed opioids?: no  Dietary Habits and Nutritional Risks How many meals a day?: (Patient-Rptd) 2 Eats fruit and vegetables daily?: (Patient-Rptd) yes Most meals are obtained by: (Patient-Rptd) preparing own meals In the last 2 weeks, have you had any of the following?: none Diabetic:: no  Functional Status Activities of Daily Living (to include ambulation/medication): (Patient-Rptd) Independent Ambulation: Independent with device- listed below Home Assistive Devices/Equipment: Eyeglasses Medication Administration: (Patient-Rptd) Independent Home Management (perform basic housework or laundry): (Patient-Rptd) Independent Manage your own finances?: (Patient-Rptd) yes Primary transportation  is: (Patient-Rptd) driving Concerns about vision?: no *vision screening is required for WTM* Concerns about hearing?: no  Fall Screening Falls in the past year?: (Patient-Rptd) 1 Number of falls in past year: (Patient-Rptd) 0 Was there an injury with Fall?: (Patient-Rptd) 0 Fall Risk Category Calculator: (Patient-Rptd) 1 Patient Fall Risk Level: (Patient-Rptd) Low Fall Risk  Fall Risk Patient at Risk for Falls Due to: History of fall(s) Fall risk Follow up: Falls evaluation completed  Home and Transportation Safety: All rugs have non-skid backing?: (Patient-Rptd) yes All stairs or steps have railings?: (Patient-Rptd) yes Grab bars in the bathtub or shower?: (Patient-Rptd) yes Have non-skid surface in bathtub or shower?: (!) (Patient-Rptd) no Good home lighting?: (Patient-Rptd) yes Regular seat belt use?: (Patient-Rptd) yes Hospital stays in the last year:: (!) (Patient-Rptd) yes How many hospital stays:: (Patient-Rptd) 1  Cognitive Assessment Difficulty concentrating, remembering, or making decisions? : (Patient-Rptd) no Will 6CIT or Mini Cog be Completed: yes What year is it?: 0 points What month is it?: 0 points Give patient an address phrase to remember (5 components): [address] [place] what time is it?: 0 points Count backwards from 20 to 1: 0 points Say the months of the year in reverse: 0 points Repeat the address phrase from earlier: 0 points 6 CIT Score: 0 points  Advance Directives (For Healthcare) Does Patient Have a Medical Advance Directive?: No Would patient like information on creating a medical advance directive?: No - Patient declined  Reviewed/Updated  Reviewed/Updated: Reviewed All (Medical, Surgical, Family, Medications, Allergies, Care Teams, Patient Goals)    Allergies (verified) Nickel   Current Medications (verified) Outpatient Encounter Medications as of 07/16/2024  Medication Sig   acetaminophen  (TYLENOL ) 650 MG CR tablet Take 650  mg by mouth 2 (two) times daily.   Ascorbic Acid (VITAMIN C PO) Take by mouth.   aspirin  EC 81 MG tablet Take 81 mg by mouth daily. Swallow whole.  Calcium  Citrate-Vitamin D  (CALCIUM  + D PO) Take 1 tablet by mouth 2 (two) times daily.   Coenzyme Q10 (COQ10) 100 MG CAPS Take 100 mg by mouth daily.   Cyanocobalamin  (VITAMIN B 12 PO) Take by mouth.   ezetimibe  (ZETIA ) 10 MG tablet Take 1 tablet (10 mg total) by mouth daily.   lisinopril -hydrochlorothiazide  (ZESTORETIC ) 20-12.5 MG tablet Take 1 tablet by mouth once daily   magnesium  oxide (MAG-OX) 400 (240 Mg) MG tablet Take 400 mg by mouth 2 (two) times daily.   Omega-3 Fatty Acids (FISH OIL PO) Take 2,000 mg by mouth 2 (two) times daily.   omeprazole (PRILOSEC) 20 MG capsule Take 20 mg by mouth daily.   OVER THE COUNTER MEDICATION Inhale 1 puff into the lungs daily as needed (congestion). himalayan pink salt inhaler   Pitavastatin  Calcium  4 MG TABS Take 1 tablet by mouth once daily   SYNTHROID  88 MCG tablet TAKE 1 TABLET BY MOUTH ONCE DAILY BEFORE BREAKFAST   [DISCONTINUED] ondansetron  (ZOFRAN ) 4 MG tablet Take 1 tablet (4 mg total) by mouth every 8 (eight) hours as needed for nausea or vomiting.   [DISCONTINUED] traMADol  (ULTRAM ) 50 MG tablet Take 1 tablet (50 mg total) by mouth every 6 (six) hours as needed for moderate pain (pain score 4-6).   No facility-administered encounter medications on file as of 07/16/2024.    History: Past Medical History:  Diagnosis Date   Allergy    Nickel   Arthritis    Essential hypertension 11/27/2013   GERD (gastroesophageal reflux disease)    Heart murmur    Hepatitis    Diagnosed 50 years ago, unsure of which type, but she did have jaundice.   Hyperlipidemia    Hypothyroidism    Osteopenia 08/21/2019   Past Surgical History:  Procedure Laterality Date   DILATION AND CURETTAGE OF UTERUS     DILATION AND CURETTAGE, DIAGNOSTIC / THERAPEUTIC     INJECTION KNEE Right 09/14/2021   Procedure: KNEE  INJECTION;  Surgeon: Barbarann Oneil BROCKS, MD;  Location: [facility] OR;  Service: Orthopedics;  Laterality: Right;   JOINT REPLACEMENT     KNEE ARTHROSCOPY W/ MENISCAL REPAIR Right 07/16/2017   TONSILLECTOMY     TOTAL KNEE ARTHROPLASTY Left 09/14/2021   Procedure: LEFT TOTAL KNEE ARTHROPLASTY;  Surgeon: Barbarann Oneil BROCKS, MD;  Location: [facility] OR;  Service: Orthopedics;  Laterality: Left;   Family History  Problem Relation Age of Onset   Hyperlipidemia Mother    Diabetes Mellitus II Mother    CAD Mother    Hypertension Mother    Depression Mother    Diabetes Mother    Arthritis Mother    CAD Father    Hyperlipidemia Father    Heart attack Father    Hypertension Sister    Hypertension Brother    Hypothyroidism Daughter    Colon cancer Maternal Grandmother 58   Hypothyroidism Daughter    Social History   Occupational History   Occupation: retired Engineer, agricultural  Tobacco Use   Smoking status: Never   Smokeless tobacco: Never  Vaping Use   Vaping status: Never Used  Substance and Sexual Activity   Alcohol use: Yes    Comment: Social glass of wine couple times a year.   Drug use: Never   Sexual activity: Not Currently    Birth control/protection: Abstinence, Post-menopausal, None   Tobacco Counseling Counseling given: Not Answered  SDOH Screenings   Food Insecurity: No Food Insecurity (07/09/2024)  Housing: Low Risk (  07/09/2024)  Transportation Needs: No Transportation Needs (07/09/2024)  Utilities: Not At Risk (07/16/2024)  Alcohol Screen: Low Risk (07/09/2024)  Depression (PHQ2-9): Low Risk (07/16/2024)  Financial Resource Strain: Low Risk (07/09/2024)  Physical Activity: Sufficiently Active (07/09/2024)  Social Connections: Moderately Integrated (07/09/2024)  Stress: No Stress Concern Present (07/09/2024)  Tobacco Use: Low Risk (07/16/2024)  Health Literacy: Adequate Health Literacy (07/16/2024)   See flowsheets for full screening details  Depression Screen PHQ 2 & 9 Depression  Scale- Over the past 2 weeks, how often have you been bothered by any of the following problems? Little interest or pleasure in doing things: 0 Feeling down, depressed, or hopeless (PHQ Adolescent also includes...irritable): 0 PHQ-2 Total Score: 0     Goals Addressed               This Visit's Progress     maintain health and activity (pt-stated)               Objective:    Today's Vitals   07/16/24 0834  BP: 119/70  Weight: 169 lb (76.7 kg)  Height: 5' 3 (1.6 m)   Body mass index is 29.94 kg/m.  Hearing/Vision screen No results found. Immunizations and Health Maintenance Health Maintenance  Topic Date Due   DTaP/Tdap/Td (2 - Td or Tdap) 10/17/2021   COVID-19 Vaccine (4 - 2025-26 season) 02/11/2024   Bone Density Scan  03/03/2024   Mammogram  08/22/2024   Colonoscopy  03/26/2025   Medicare Annual Wellness (AWV)  07/16/2025   Pneumococcal Vaccine: 50+ Years  Completed   Influenza Vaccine  Completed   Hepatitis C Screening  Completed   Zoster Vaccines- Shingrix   Completed   Meningococcal B Vaccine  Aged Out        Assessment/Plan:  This is a routine wellness examination for Kelly Barajas.  Patient Care Team: [name], MD as PCP - General (Family Medicine) Lavona Agent, MD as PCP - Cardiology (Cardiology) Barbarann Oneil BROCKS, MD (Inactive) as Consulting Physician (Orthopedic Surgery) Obgyn, Anna Debera Jayson KANDICE, MD as Consulting Physician (Cardiology) Nivia Sally Alice, OD as Consulting Physician (Optometry) Ladora, Sherlean CROME, [facility] (Inactive) (Pharmacist)  I have personally reviewed and noted the following in the patients chart:   Medical and social history Use of alcohol, tobacco or illicit drugs  Current medications and supplements including opioid prescriptions. Functional ability and status Nutritional status Physical activity Advanced directives List of other physicians Hospitalizations, surgeries, and ER visits in previous 12  months Vitals Screenings to include cognitive, depression, and falls Referrals and appointments  Orders Placed This Encounter  Procedures   MM Digital Screening Breast Bilateral    Standing Status:   Future    Expiration Date:   10/13/2024    Reason for Exam (SYMPTOM  OR DIAGNOSIS REQUIRED):   Breast cancer screening    [facility]   In addition, I have reviewed and discussed with patient certain preventive protocols, quality metrics, and best practice recommendations. A written personalized care plan for preventive services as well as general preventive health recommendations were provided to patient.   Ellouise VEAR Haws, LPN   12/15/7971   Return in about 1 year (around 07/20/2025).  After Visit Summary: (MyChart) Due to this being a telephonic visit, the after visit summary with patients personalized plan was offered to patient via MyChart   Nurse Notes: No voiced or noted concerns at this time "

## 2024-08-13 ENCOUNTER — Other Ambulatory Visit (HOSPITAL_BASED_OUTPATIENT_CLINIC_OR_DEPARTMENT_OTHER)

## 2024-08-26 ENCOUNTER — Ambulatory Visit

## 2024-09-18 ENCOUNTER — Ambulatory Visit: Admitting: Family Medicine
